# Patient Record
Sex: Male | Born: 1940
Health system: Southern US, Community
[De-identification: ages and names within clinical notes are randomized; demographics above are authoritative.]

## PROBLEM LIST (undated history)

## (undated) DIAGNOSIS — G4733 Obstructive sleep apnea (adult) (pediatric): Secondary | ICD-10-CM

## (undated) DIAGNOSIS — E0590 Thyrotoxicosis, unspecified without thyrotoxic crisis or storm: Secondary | ICD-10-CM

## (undated) DIAGNOSIS — I4891 Unspecified atrial fibrillation: Secondary | ICD-10-CM

## (undated) DIAGNOSIS — IMO0002 Reserved for concepts with insufficient information to code with codable children: Secondary | ICD-10-CM

## (undated) DIAGNOSIS — K219 Gastro-esophageal reflux disease without esophagitis: Secondary | ICD-10-CM

## (undated) DIAGNOSIS — Z87442 Personal history of urinary calculi: Secondary | ICD-10-CM

## (undated) DIAGNOSIS — Z9989 Dependence on other enabling machines and devices: Secondary | ICD-10-CM

## (undated) DIAGNOSIS — I4892 Unspecified atrial flutter: Secondary | ICD-10-CM

## (undated) HISTORY — DX: Unspecified atrial flutter: I48.92

## (undated) HISTORY — PX: ROTATOR CUFF REPAIR: SHX139

## (undated) HISTORY — DX: Obstructive sleep apnea (adult) (pediatric): G47.33

## (undated) HISTORY — DX: Unspecified atrial fibrillation: I48.91

## (undated) HISTORY — DX: Obstructive sleep apnea (adult) (pediatric): Z99.89

## (undated) HISTORY — DX: Thyrotoxicosis, unspecified without thyrotoxic crisis or storm: E05.90

## (undated) HISTORY — PX: OTHER SURGICAL HISTORY: SHX169

## (undated) HISTORY — DX: Reserved for concepts with insufficient information to code with codable children: IMO0002

---

## 1998-03-18 ENCOUNTER — Emergency Department (HOSPITAL_COMMUNITY): Admission: EM | Admit: 1998-03-18 | Discharge: 1998-03-18 | Payer: Self-pay | Admitting: Emergency Medicine

## 2007-08-19 ENCOUNTER — Ambulatory Visit (HOSPITAL_BASED_OUTPATIENT_CLINIC_OR_DEPARTMENT_OTHER): Admission: RE | Admit: 2007-08-19 | Discharge: 2007-08-20 | Payer: Self-pay | Admitting: Orthopedic Surgery

## 2007-08-23 ENCOUNTER — Emergency Department (HOSPITAL_COMMUNITY): Admission: EM | Admit: 2007-08-23 | Discharge: 2007-08-23 | Payer: Self-pay | Admitting: Emergency Medicine

## 2008-06-23 ENCOUNTER — Ambulatory Visit: Payer: Self-pay | Admitting: Internal Medicine

## 2008-06-23 DIAGNOSIS — E0590 Thyrotoxicosis, unspecified without thyrotoxic crisis or storm: Secondary | ICD-10-CM | POA: Insufficient documentation

## 2008-06-23 DIAGNOSIS — E210 Primary hyperparathyroidism: Secondary | ICD-10-CM | POA: Diagnosis present

## 2008-06-23 DIAGNOSIS — K4090 Unilateral inguinal hernia, without obstruction or gangrene, not specified as recurrent: Secondary | ICD-10-CM | POA: Insufficient documentation

## 2008-06-26 ENCOUNTER — Encounter (INDEPENDENT_AMBULATORY_CARE_PROVIDER_SITE_OTHER): Payer: Self-pay | Admitting: *Deleted

## 2008-07-05 ENCOUNTER — Telehealth (INDEPENDENT_AMBULATORY_CARE_PROVIDER_SITE_OTHER): Payer: Self-pay | Admitting: *Deleted

## 2008-07-06 ENCOUNTER — Telehealth (INDEPENDENT_AMBULATORY_CARE_PROVIDER_SITE_OTHER): Payer: Self-pay | Admitting: *Deleted

## 2008-07-06 ENCOUNTER — Ambulatory Visit: Payer: Self-pay | Admitting: Internal Medicine

## 2008-07-06 LAB — CONVERTED CEMR LAB: Hgb A1c MFr Bld: 6.5 % — ABNORMAL HIGH (ref 4.6–6.0)

## 2008-07-11 ENCOUNTER — Ambulatory Visit: Payer: Self-pay | Admitting: Gastroenterology

## 2008-08-01 ENCOUNTER — Encounter: Payer: Self-pay | Admitting: Internal Medicine

## 2008-08-08 ENCOUNTER — Ambulatory Visit: Payer: Self-pay | Admitting: Gastroenterology

## 2008-08-08 LAB — HM COLONOSCOPY

## 2008-08-29 ENCOUNTER — Ambulatory Visit: Payer: Self-pay | Admitting: Internal Medicine

## 2008-08-29 ENCOUNTER — Emergency Department (HOSPITAL_COMMUNITY): Admission: EM | Admit: 2008-08-29 | Discharge: 2008-08-29 | Payer: Self-pay | Admitting: Emergency Medicine

## 2008-09-01 ENCOUNTER — Ambulatory Visit: Payer: Self-pay | Admitting: Internal Medicine

## 2008-09-04 ENCOUNTER — Inpatient Hospital Stay (HOSPITAL_COMMUNITY): Admission: EM | Admit: 2008-09-04 | Discharge: 2008-09-06 | Payer: Self-pay | Admitting: Emergency Medicine

## 2008-09-04 ENCOUNTER — Ambulatory Visit: Payer: Self-pay | Admitting: Cardiovascular Disease

## 2008-09-05 ENCOUNTER — Encounter (INDEPENDENT_AMBULATORY_CARE_PROVIDER_SITE_OTHER): Payer: Self-pay | Admitting: Internal Medicine

## 2008-09-13 ENCOUNTER — Telehealth: Payer: Self-pay | Admitting: Internal Medicine

## 2008-09-15 ENCOUNTER — Encounter: Payer: Self-pay | Admitting: Internal Medicine

## 2008-09-21 ENCOUNTER — Inpatient Hospital Stay (HOSPITAL_COMMUNITY): Admission: RE | Admit: 2008-09-21 | Discharge: 2008-09-22 | Payer: Self-pay | Admitting: Neurological Surgery

## 2008-09-28 ENCOUNTER — Ambulatory Visit: Payer: Self-pay | Admitting: Internal Medicine

## 2008-09-28 DIAGNOSIS — I480 Paroxysmal atrial fibrillation: Secondary | ICD-10-CM | POA: Insufficient documentation

## 2008-09-28 DIAGNOSIS — I4891 Unspecified atrial fibrillation: Secondary | ICD-10-CM | POA: Insufficient documentation

## 2008-09-29 ENCOUNTER — Encounter: Payer: Self-pay | Admitting: Internal Medicine

## 2008-10-04 ENCOUNTER — Ambulatory Visit (HOSPITAL_COMMUNITY): Admission: RE | Admit: 2008-10-04 | Discharge: 2008-10-04 | Payer: Self-pay | Admitting: Neurological Surgery

## 2008-10-06 ENCOUNTER — Encounter: Payer: Self-pay | Admitting: Internal Medicine

## 2008-10-13 ENCOUNTER — Encounter: Payer: Self-pay | Admitting: Internal Medicine

## 2008-10-18 ENCOUNTER — Telehealth (INDEPENDENT_AMBULATORY_CARE_PROVIDER_SITE_OTHER): Payer: Self-pay | Admitting: *Deleted

## 2008-11-08 ENCOUNTER — Ambulatory Visit: Payer: Self-pay | Admitting: Internal Medicine

## 2008-12-01 HISTORY — PX: HERNIA REPAIR: SHX51

## 2008-12-12 ENCOUNTER — Telehealth: Payer: Self-pay | Admitting: Internal Medicine

## 2009-01-11 ENCOUNTER — Encounter: Payer: Self-pay | Admitting: Internal Medicine

## 2009-01-17 ENCOUNTER — Encounter: Admission: RE | Admit: 2009-01-17 | Discharge: 2009-01-17 | Payer: Self-pay | Admitting: Neurological Surgery

## 2009-02-14 ENCOUNTER — Encounter: Admission: RE | Admit: 2009-02-14 | Discharge: 2009-02-14 | Payer: Self-pay | Admitting: Neurological Surgery

## 2009-02-27 ENCOUNTER — Encounter: Payer: Self-pay | Admitting: Internal Medicine

## 2009-03-12 ENCOUNTER — Telehealth (INDEPENDENT_AMBULATORY_CARE_PROVIDER_SITE_OTHER): Payer: Self-pay | Admitting: *Deleted

## 2009-05-11 ENCOUNTER — Encounter: Payer: Self-pay | Admitting: Internal Medicine

## 2009-07-22 ENCOUNTER — Ambulatory Visit: Payer: Self-pay | Admitting: Internal Medicine

## 2009-07-22 ENCOUNTER — Emergency Department (HOSPITAL_COMMUNITY): Admission: EM | Admit: 2009-07-22 | Discharge: 2009-07-22 | Payer: Self-pay | Admitting: Emergency Medicine

## 2009-07-23 ENCOUNTER — Encounter: Payer: Self-pay | Admitting: Cardiology

## 2009-07-26 ENCOUNTER — Telehealth (INDEPENDENT_AMBULATORY_CARE_PROVIDER_SITE_OTHER): Payer: Self-pay

## 2009-07-30 ENCOUNTER — Ambulatory Visit: Payer: Self-pay

## 2009-12-31 ENCOUNTER — Ambulatory Visit: Payer: Self-pay | Admitting: Internal Medicine

## 2009-12-31 LAB — CONVERTED CEMR LAB
Bilirubin Urine: NEGATIVE
Glucose, Urine, Semiquant: NEGATIVE
Microalb Creat Ratio: 5.1 mg/g (ref 0.0–30.0)

## 2010-01-03 LAB — CONVERTED CEMR LAB
BUN: 11 mg/dL (ref 6–23)
Calcium: 9.9 mg/dL (ref 8.4–10.5)
Cholesterol: 152 mg/dL (ref 0–200)
Glucose, Bld: 106 mg/dL — ABNORMAL HIGH (ref 70–99)
HCT: 45.3 % (ref 39.0–52.0)
HDL: 41.1 mg/dL (ref >39.00)
Hemoglobin: 14.7 g/dL (ref 13.0–17.0)
Lymphocytes Relative: 35 % (ref 12.0–46.0)
MCV: 93.1 fL (ref 78.0–100.0)
Monocytes Absolute: 0.6 10*3/uL (ref 0.1–1.0)
Neutro Abs: 3.7 10*3/uL (ref 1.4–7.7)
Neutrophils Relative %: 53.6 % (ref 43.0–77.0)
Platelets: 179 10*3/uL (ref 150.0–400.0)
Potassium: 3.7 meq/L (ref 3.5–5.1)
RDW: 12.3 % (ref 11.5–14.6)
Sodium: 139 meq/L (ref 135–145)
Total CHOL/HDL Ratio: 4
Triglycerides: 119 mg/dL (ref 0.0–149.0)
VLDL: 23.8 mg/dL (ref 0.0–40.0)
WBC: 6.8 10*3/uL (ref 4.5–10.5)

## 2010-04-16 ENCOUNTER — Telehealth (INDEPENDENT_AMBULATORY_CARE_PROVIDER_SITE_OTHER): Payer: Self-pay | Admitting: *Deleted

## 2010-04-24 ENCOUNTER — Ambulatory Visit: Payer: Self-pay | Admitting: Internal Medicine

## 2010-04-25 ENCOUNTER — Encounter (INDEPENDENT_AMBULATORY_CARE_PROVIDER_SITE_OTHER): Payer: Self-pay | Admitting: *Deleted

## 2010-05-13 ENCOUNTER — Ambulatory Visit: Payer: Self-pay | Admitting: Pulmonary Disease

## 2010-05-13 DIAGNOSIS — G4733 Obstructive sleep apnea (adult) (pediatric): Secondary | ICD-10-CM

## 2010-05-13 DIAGNOSIS — Z9989 Dependence on other enabling machines and devices: Secondary | ICD-10-CM | POA: Insufficient documentation

## 2010-06-01 ENCOUNTER — Encounter: Payer: Self-pay | Admitting: Pulmonary Disease

## 2010-06-01 ENCOUNTER — Ambulatory Visit (HOSPITAL_BASED_OUTPATIENT_CLINIC_OR_DEPARTMENT_OTHER): Admission: RE | Admit: 2010-06-01 | Discharge: 2010-06-01 | Payer: Self-pay | Admitting: Pulmonary Disease

## 2010-06-08 ENCOUNTER — Ambulatory Visit: Payer: Self-pay | Admitting: Pulmonary Disease

## 2010-06-11 ENCOUNTER — Telehealth (INDEPENDENT_AMBULATORY_CARE_PROVIDER_SITE_OTHER): Payer: Self-pay | Admitting: *Deleted

## 2010-06-21 ENCOUNTER — Ambulatory Visit: Payer: Self-pay | Admitting: Pulmonary Disease

## 2010-09-10 ENCOUNTER — Ambulatory Visit: Payer: Self-pay | Admitting: Pulmonary Disease

## 2010-11-29 ENCOUNTER — Ambulatory Visit
Admission: RE | Admit: 2010-11-29 | Discharge: 2010-11-29 | Payer: Self-pay | Source: Home / Self Care | Attending: Internal Medicine | Admitting: Internal Medicine

## 2010-12-05 ENCOUNTER — Telehealth: Payer: Self-pay | Admitting: Internal Medicine

## 2010-12-22 ENCOUNTER — Encounter: Payer: Self-pay | Admitting: Neurological Surgery

## 2010-12-29 LAB — CONVERTED CEMR LAB
Basophils Relative: 0.2 % (ref 0.0–3.0)
Chloride: 99 meq/L (ref 96–112)
Eosinophils Absolute: 0.1 10*3/uL (ref 0.0–0.7)
LDL Cholesterol: 111 mg/dL — ABNORMAL HIGH (ref 0–99)
Lymphocytes Relative: 31.3 % (ref 12.0–46.0)
MCV: 91.6 fL (ref 78.0–100.0)
Neutro Abs: 4.4 10*3/uL (ref 1.4–7.7)
Neutrophils Relative %: 57.5 % (ref 43.0–77.0)
Platelets: 195 10*3/uL (ref 150–400)
Potassium: 4.1 meq/L (ref 3.5–5.1)
RDW: 12 % (ref 11.5–14.6)
Total CHOL/HDL Ratio: 4.9
VLDL: 20 mg/dL (ref 0–40)

## 2010-12-31 NOTE — Progress Notes (Signed)
Summary: rf neurontin  Phone Note Refill Request   Refills Requested: Medication #1:  NEURONTIN 300 MG CAPS 1 by mouth two times a day. [facility] [place]   Method Requested: Electronic Initial call taken by: Shary Decamp,  December 12, 2008 11:46 AM  Follow-up for Phone Call        ok Rf 1 month and 6 RF , if patient feels need a stronger dose , we can go for 600mg  two times a day Madonna Flegal E. Caylah Plouff MD  December 12, 2008 12:56 PM       Prescriptions: NEURONTIN 300 MG CAPS (GABAPENTIN) 1 by mouth two times a day  #60 x 6   Entered by:   Shary Decamp   Authorized by:   Nolon Rod. Kala Ambriz MD   Signed by:   Shary Decamp on 12/12/2008   Method used:   Electronically to        [facility] (retail)       [address]       [place], [place]  [address]       Ph: [phone]       Fax: [phone]   RxID:   [id number]

## 2010-12-31 NOTE — Assessment & Plan Note (Signed)
Summary: rov for osa   Visit Type:  Follow-up Copy to:  [facility] Primary Provider/Referring Provider:  Nolon Rod. Paz MD  CC:  follow up. pt states he uses cpap 7/7 nights x 4-6 hrs a night. pt states when he has a cold trhe mask makes him feel like he is sufficating. Pt states when he wakes up in the mornings and takes his mask off he has marks around his mouth. .  History of Present Illness: the pt comes in today for f/u of his known severe osa.  He was started on cpap at the last visit, and has been wearing compliantly.  He feels he is sleeping better, and has noted some improvement in his EDS, but still has some sleepiness with inactivity.  I have reminded him that we have yet to optimize his pressure.  He has had some mask issues, but much better after loosening the straps.  He feels the fit is adequate currently.  He has no issues with the pressure.  He does have some nasal congestion issues at times, but did not realize he could turn up the heat on his humidifier.  Current Medications (verified): 1)  None  Allergies (verified): 1)  ! Percocet (Oxycodone-Acetaminophen) 2)  ! Vicodin (Hydrocodone-Acetaminophen) 3)  ! * Oxycodone  Past History:  Past medical, surgical, family and social histories (including risk factors) reviewed, and no changes noted (except as noted below).  Past Medical History: Reviewed history from 04/24/2010 and no changes required. Paroxysmal atrial fibrillation  h/o acute HNP, s/p surgery 09-21-08 occasional paresthesia of the left foot Degenerative disk disease.  Left inguinal hernia.  Remote history of hyperthyroidism.   no h/o ablation AODM A1C 6.5 07-2008  Past Surgical History: Reviewed history from 05/13/2010 and no changes required. Rotator cuff repair (2009) - rt 10-09 L5 acute HNP, s/p surgery 09-21-08-- still has occasional paresthesia of the left foot hernia repair 2010  Family History: Reviewed history from 05/13/2010 and no changes  required. CAD - no HTN - M DM - brother stroke - no colon Ca - no prostate Ca - no father-alzheimer  Social History: Reviewed history from 05/13/2010 and no changes required. 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  The patient denies shortness of breath with activity, shortness of breath at rest, productive cough, non-productive cough, coughing up blood, chest pain, irregular heartbeats, indigestion, loss of appetite, weight change, abdominal pain, difficulty swallowing, sore throat, tooth/dental problems, headaches, sneezing, itching, ear ache, anxiety, depression, hand/feet swelling, change in color of mucus, and fever.    Vital Signs:  Patient profile:   70 year old male Height:      67 inches Weight:      169.13 pounds O2 Sat:      96 % on Room air Temp:     97.8 degrees F oral Pulse rate:   64 / minute BP sitting:   122 / 70  (right arm) Cuff size:   regular  Vitals Entered By: Carver Fila (September 10, 2010 9:19 AM)  O2 Flow:  Room air CC: follow up. pt states he uses cpap 7/7 nights x 4-6 hrs a night. pt states when he has a cold trhe mask makes him feel like he is sufficating. Pt states when he  wakes up in the mornings and takes his mask off he has marks around his mouth.  Comments meds and allergies updated Phone number updated Carver Fila  September 10, 2010 9:20 AM    Physical Exam  General:  wd male in nad Nose:  no skin breakdown or pressure necrosis from cpap mask no obvious purulence or drainage. Mouth:  clear Extremities:  no edema or cyanosis noted. Neurologic:  alert, does not appear sleepy, moves all 4 without obvious motor deficit.   Impression & Recommendations:  Problem # 1:  OBSTRUCTIVE SLEEP APNEA  (ICD-327.23) the pt is slowly getting adapted to cpap.  He is wearing compliantly, and all of his issues today are easily correctable.  I have asked him to not pull his mask straps too tight, but if he continues to have fitting issues, would look at a different type of mask.  I have also asked him to increase the heat on his humidifier to help with moisture and nasal stuffiness.  Finally, I have reminded him that we have yet to optimize his pressure, and that he will need to have his machine turned to auto mode to accomplish this. Care Plan:  At this point, will arrange for the patient's machine to be changed over to auto mode for 2 weeks to optimize their pressure.  I will review the downloaded data once sent by dme, and also evaluate for compliance, leaks, and residual osa.  I will call the patient and dme to discuss the results, and have the patient's machine set appropriately.  This will serve as the pt's cpap pressure titration.  Other Orders: Est. Patient Level IV (40347) DME Referral (DME)  Patient Instructions: 1)  will optimize your pressure for you on automatic mode for the next 2 weeks.  I will let you know the results when data available. 2)  followup with me in 6mos.

## 2010-12-31 NOTE — Assessment & Plan Note (Signed)
Summary: f/u from ED - per laurie/swh   Vital Signs:  Patient Profile:   70 Years Old Male Weight:      170 pounds Pulse rate:   74 / minute BP sitting:   136 / 82  (left arm)  Vitals Entered By: Doristine Devoid (September 01, 2008 10:22 AM)                 Chief Complaint:  F/U FROM ER.  History of Present Illness: F/u FROM ER here w/ wife RECORDS REVIEWED   IMPRESSION/PLAN:  Intractable left buttock pain with radiculopathy down   left lower extremity.  It appears the patient is suffering from sciatica   as the patient has experienced this in the past approximately 10 years   ago.  Unsure of onset of symptoms, likely due to yard work and possibly   exacerbated at chiropractic visit last week.  Again, patient with no   bowel or bladder incontinence and physical exam not consistent with any   spinal cord injury.  T11 fracture noted on plain film, likely a   coincidental finding as patient with no tenderness along palpation of   spine.  The patient able to ambulate in emergency room without any   difficulty, therefore, I feel comfortable discharging the patient home   from the emergency room on antiinflammatory medication, to include   Celebrex 200 mg p.o. b.i.d. x10 days.  The patient instructed to   continue taking Norco and Flexeril as needed for muscle spasm and pain.   The   patient and wife have been instructed to return to the emergency room   for any worsening pain, lower extremity weakness or bowel or bladder   incontinence.  Will call the patient's primary care physician's office   in a.m. in order to schedule a follow up appointment for this week to   determine effectiveness of treatment plan.        Current Allergies: No known allergies   Past Medical History:    Reviewed history from 06/23/2008 and no changes required:       h/o  Hyperthyroidism years ago,  no h/o ablation       h/o tachycardia after rotator cuff surgery  Past Surgical History:  Reviewed history from 06/23/2008 and no changes required:       Rotator cuff repair (2009) - rt   Social History:    Reviewed history from 06/23/2008 and no changes required:       Married       3 kids       Scientist, research (medical), semi-retired    Review of Systems       over all slightly  better, pain worse w/stepping w/ L foot  pain still radiates from L buttock  down to L leg to the toes, + tingling at toes sometimes on Celebrex w/o GI s/e, flexeril and if pain severe has dilaudid and oxycodone   General      Denies fever.  GI      no b/b incontinence  Derm      no buttock rash   Physical Exam  General:     alert and well-developed.   Extremities:     no pretibial edema bilaterally  Neurologic:     alert & oriented X3, strength normal in all extremities, sensation intact to pinprick, and DTRs symmetrical and normal.   ++ L streight leg test    Impression & Recommendations:  Problem # 1:  BACK PAIN (ICD-724.5) symptoms likely  due to a radiculopathy rec:  neuro surgical referal (patient preference instead of Ortho) MRI take celebrex 1 a day call if worse pain mamangment : see below  His updated medication list for this problem includes:    Celebrex 200 Mg Caps (Celecoxib) .Marland Kitchen..Marland Kitchen Two times a day    Flexeril 10 Mg Tabs (Cyclobenzaprine hcl) .Marland Kitchen... As needed    Vicodin 5-500 Mg Tabs (Hydrocodone-acetaminophen) .Marland Kitchen... 1 or 2 by mouth qid as needed  Orders: Radiology Referral (Radiology) Neurosurgeon Referral (Neurosurgeon)   Complete Medication List: 1)  Celebrex 200 Mg Caps (Celecoxib) .... Two times a day 2)  Flexeril 10 Mg Tabs (Cyclobenzaprine hcl) .... As needed 3)  Vicodin 5-500 Mg Tabs (Hydrocodone-acetaminophen) .Marland Kitchen.. 1 or 2 by mouth qid as needed    Prescriptions: VICODIN 5-500 MG TABS (HYDROCODONE-ACETAMINOPHEN) 1 or 2 by mouth qid as needed  #40 x 0   Entered and Authorized by:   Nolon Rod. Kayra Crowell MD   Signed by:   Nolon Rod. Randel Hargens MD on  09/01/2008   Method used:   Print then Give to Patient   RxID:   [id number]  ]

## 2010-12-31 NOTE — Progress Notes (Signed)
Summary: need to sched ov with kc    [id number]  Phone Note Outgoing Call   Call placed by: Arman Filter LPN,  June 11, 2010 4:13 PM Call placed to: Patient Summary of Call: pt needs ov with kc to discuss sleep study results.   [id number].  Arman Filter LPN  June 11, 2010 4:14 PM  Initial call taken by: Arman Filter LPN,  June 11, 2010 4:14 PM  Follow-up for Phone Call        [facility]  Aundra Millet Kimber Fritts LPN  June 12, 2010 2:14 PM   pt scheduled for Friday 06-21-2010 at 4pm.  Arman Filter LPN  June 14, 2010 9:15 AM

## 2010-12-31 NOTE — Assessment & Plan Note (Signed)
Summary: snores/wakes up tired/cbs   Vital Signs:  Patient profile:   70 year old male Height:      67 inches Weight:      173 pounds BMI:     27.19 Pulse rate:   64 / minute BP sitting:   110 / 60  Vitals Entered By: Shary Decamp (Apr 24, 2010 9:40 AM) CC: snoring, wakes up tired, wife feels pt needs sleep study   History of Present Illness: ROV -snoring x long time, sleeps on his back, wakes up tired, wife feels pt needs sleep study wife reports he falls sleep easy, gets drowsy when driving  -AODM , "do I have DM?"  Current Medications (verified): 1)  Aspirin 81 Mg Tbec (Aspirin) .... One A Day  Allergies (verified): 1)  ! Percocet (Oxycodone-Acetaminophen) 2)  ! Vicodin (Hydrocodone-Acetaminophen)  Past History:  Past Medical History: Paroxysmal atrial fibrillation  h/o acute HNP, s/p surgery 09-21-08 occasional paresthesia of the left foot Degenerative disk disease.  Left inguinal hernia.  Remote history of hyperthyroidism.   no h/o ablation AODM A1C 6.5 07-2008  Past Surgical History: Reviewed history from 12/31/2009 and no changes required. Rotator cuff repair (2009) - rt 10-09 L5 acute HNP, s/p surgery 09-21-08-- still has occasional paresthesia of the left foot  Social History: Married 3 Fish farm manager, semi-retired Denies tobacco, alcohol, or drugs thinking about improving his diet   Review of Systems       feels well  CV:  Denies chest pain or discomfort, palpitations, and swelling of feet.  Physical Exam  General:  alert and well-developed.   Lungs:  normal respiratory effort, no intercostal retractions, no accessory muscle use, and normal breath sounds.   Heart:  normal rate, regular rhythm, and no murmur.   Extremities:  no pretibial edema bilaterally    Impression & Recommendations:  Problem # 1:  DIABETES MELLITUS (ICD-250.00) the patient has DM, he is aware  abundant information provided regards DM, A1C goals, CBG  goals encouraged to do a healthier diet   His updated medication list for this problem includes:    Aspirin 81 Mg Tbec (Aspirin) ..... One a day  Orders: Venipuncture (09811) TLB-A1C / Hgb A1C (Glycohemoglobin) (83036-A1C)  Labs Reviewed: Creat: 0.9 (12/31/2009)    Reviewed HgBA1c results: 6.6 (12/31/2009)  6.5 (07/06/2008)  Problem # 2:  ? of SLEEP APNEA (ICD-780.57)   has snoring, some fatigue he is not obese, neck anatomy seems ok plan: refer to pulmonary to determine if  a sleep study is needed   Orders: Pulmonary Referral (Pulmonary)  Complete Medication List: 1)  Aspirin 81 Mg Tbec (Aspirin) .... One a day  Patient Instructions: 1)  Please schedule a follow-up appointment in 4 months .

## 2010-12-31 NOTE — Progress Notes (Signed)
Summary: QUESTION--HAVE COLONOSCPY OR HERNIA SURG 1ST??  Phone Note Call from Patient Call back at CELL = [phone]   Caller: Patient Summary of Call: PATIENT CAME IN THIS MORNING FOR LAB WORK AND LEFT TRIAGE MESSAGE FOR DR PAZ:  PATIENT SAYS THAT HE AND DR PAZ HAVE DISCUSSED PATIENT NEEDING COLONOSCOPY (WHICH IS NOT LISTED AS A REFERRAL YET) AND ALSO NEEDING HERNIA SURGERY  HIS QUESTION TO DR PAZ:  SHOULD HE HAVE COLONOSCOPY OR HERNIA SURGERY FIRST FOR BEST RESULTS AND LEAST PAIN (HE SAYS HE THINKS IT SHOULD BE THE COLONOSCOPY FIRST, BUT WANTS DR PAZ OPINION)  PLEASE CALL HIS CELL PHONE WITH ANSWER Initial call taken by: Jerolyn Shin,  July 06, 2008 11:47 AM  Follow-up for Phone Call        colonoscopy first sounds okay Jose E. Paz MD  July 06, 2008 3:27 PM   left message on machine ..........Marland KitchenDoristine Devoid  July 07, 2008 2:48 PM   spoke with patient advise colonoscopy first also declined nutritionist referral at this time sent patient diabetes information along with some exercise information aware of all labs..........Marland KitchenDoristine Devoid  July 07, 2008 2:57 PM

## 2010-12-31 NOTE — Assessment & Plan Note (Signed)
Summary: YEARLY/SWH   Vital Signs:  Patient Profile:   70 Years Old Male Weight:      170 pounds Pulse rate:   72 / minute BP sitting:   112 / 72  Vitals Entered By: Shary Decamp (June 23, 2008 8:41 AM)                 Chief Complaint:  NEW PT TO EST; NEEDS REFERRAL FOR SCREENING COLON; C/O OF LLQ PAIN....Ronald KitchenPT STATES THAT HE CAN FEEL SOMETHING "PROTRUDING".  History of Present Illness: NEW PT TO EST;   NEEDS REFERRAL FOR SCREENING COLON L groin mass, worse w/ bending , can push it in  Asthma History:  Immunizations:      Updated pneumovax: Pneumovax (Medicare)     Updated Prior Medication List: No Medications Current Allergies (reviewed today): No known allergies   Past Medical History:    h/o  Hyperthyroidism years ago,  no h/o ablation    h/o tachycardia after rotator cuff surgery  Past Surgical History:    Rotator cuff repair (2009) - rt   Family History:    CAD - no    HTN - M    DM - no    stroke - no    colon Ca - no    prostate Ca - no  Social History:    Married    3 Engineer, site, semi-retired   Risk Factors:  Tobacco use:  never Passive smoke exposure:  no Drug use:  no Alcohol use:  no   Review of Systems  CV      Denies chest pain or discomfort and swelling of feet.  Resp      Denies cough and shortness of breath.      snores a lot no AM tiredness  GI      Denies bloody stools and diarrhea.  GU      Denies hematuria and urinary hesitancy.      nocturia x 3  Psych      Denies anxiety and depression.   Physical Exam  General:     alert and well-developed.   Neck:     no thyromegaly.   Lungs:     normal respiratory effort, no intercostal retractions, no accessory muscle use, and normal breath sounds.   Heart:     normal rate, regular rhythm, and no murmur.   Abdomen:     soft, non-tender, no hepatomegaly, and no splenomegaly. L suprapubic area feels full, worse  w/  cough   Rectal:     No external abnormalities noted. Normal sphincter tone. No rectal masses or tenderness. no stools found Prostate:     Prostate gland firm and smooth, no enlargement, nodularity, tenderness, mass, asymmetry or induration. Extremities:     no pretibial edema bilaterally  Psych:     Cognition and judgment appear intact. Alert and cooperative with normal attention span and concentration.     Impression & Recommendations:  Problem # 1:  HYPERTHYROIDISM (ICD-242.90) labs Orders: TLB-TSH (Thyroid Stimulating Hormone) (84443-TSH)   Problem # 2:  Hx of UNSPECIFIED TACHYCARDIA (ICD-785.0) labs  EKG--sinus brady Orders: TLB-BMP (Basic Metabolic Panel-BMET) (80048-METABOL) TLB-CBC Platelet - w/Differential (85025-CBCD) Venipuncture (16109) EKG w/ Interpretation (93000)   Problem # 3:  HEALTH SCREENING (ICD-V70.0) refer to [facility] labs  TD and pneumonia shot today d/w pt shingles shot diet and exercise Orders: TLB-Lipid Panel (80061-LIPID) TLB-PSA (Prostate Specific Antigen) (84153-PSA) Gastroenterology Referral (GI)   Problem # 4:  INGUINAL HERNIA, LEFT (ICD-550.90) surgical referal Orders: Surgical Referral (Surgery)   Other Orders: Tetanus Toxoid w/Dx (16109) Pneumoccal Vaccine Adm- Medicare (G0009) Admin 1st Vaccine (60454) Admin of Any Addtl Vaccine (09811)   Patient Instructions: 1)  Please schedule a follow-up appointment in 1 year.   ]  Tetanus/Td Vaccine    Vaccine Type: Td    Site: left deltoid    Mfr: Sanofi Pasteur    Dose: 0.5 ml    Route: IM    Given by: Shary Decamp    Exp. Date: 01/02/2010    Lot #: [id number]  Pneumovax Vaccine    Vaccine Type: Pneumovax (Medicare)    Site: right deltoid    Mfr: Merck    Dose: 0.5 ml    Route: IM    Given by: Shary Decamp    Exp. Date: 04/30/2009    Lot #: [id number]

## 2010-12-31 NOTE — Procedures (Signed)
Summary: Coklonoscopy   Colonoscopy  Procedure date:  08/08/2008  Findings:      Location:  [facility].    Procedures Next Due Date:    Colonoscopy: 08/2018  Patient Name: Ronald Kelley, Ronald Kelley MRN:  Procedure Procedures: Colonoscopy CPT: 60454.  Personnel: Endoscopist: Rachael Fee, MD.  Referred By: Willow Ora, M.D.  Exam Location: Exam performed in Endoscopy Suite. Outpatient  Patient Consent: Procedure, Alternatives, Risks and Benefits discussed, consent obtained, from patient. Consent was obtained by the RN.  Indications  Average Risk Screening Routine.  History  Current Medications: Patient is not currently taking Coumadin.  Comments: Patient history reviewed and updated, pre-procedure physical performed prior to initiation of sedation? yes Pre-Exam Physical: Performed Aug 08, 2008. Cardio-pulmonary exam, Abdominal exam, Mental status exam WNL.  Exam Exam: Extent of exam reached: Cecum, extent intended: Cecum.  The cecum was identified by appendiceal orifice and IC valve. Patient position: on left side. Time to Cecum: 00:03:33. Time for Withdrawl: 00:08:28. Colon retroflexion performed. Images taken. ASA Classification: II. Tolerance: good.  Monitoring: Pulse and BP monitoring, Oximetry used. Supplemental O2 given.  Colon Prep Prep results: good.  Sedation Meds: Patient assessed and found to be appropriate for moderate (conscious) sedation. Fentanyl 50 mcg. given IV. Versed 7 mg. given IV.  Findings - NORMAL EXAM: Cecum to Rectum. Comments: OTHERWISE NORMAL EXAMINATION.  HEMORRHOIDS: External. Size: Small.   Assessment Abnormal examination, see findings above.  Comments: HEMORRHOIDS, OTHERWISE NORMAL EXAMINATION.  NO POLYPS OR CANCERS.  HE SHOULD CONTINUE TO FOLLOW CURRENT COLORECTAL CANCER SCREENING GUIDELINES WITH A REPEAT COLONOSCOPY IN 10 YEARS. Events  Unplanned Interventions: No intervention was required.  Unplanned  Events: There were no complications. Plans Comments: COLONOSCOPY IN 10 YEARS.   cc:  Willow Ora, MD  This report was created from the original endoscopy report, which was reviewed and signed by the above listed endoscopist.

## 2010-12-31 NOTE — Assessment & Plan Note (Signed)
Summary: consult for possible osa   Copy to:  [facility] Primary Provider/Referring Provider:  Nolon Rod. Paz MD  CC:  Sleep Consult.  History of Present Illness: The pt is a 70y/o male who I have been asked to see for possible osa.  He has been noted to have loud snoring as well as snoring arousals during sleep.  He goes to bed around 10-11pm, and arises at 6-7am to start his day.  He is a very restless sleeper, and is only rested 60% of the time at best upon awakening.  He has significant EDS, and his wife sees him dozing a lot when he gets quiet.  He will fall asleep watching tv or reading, and can also do this at prayer meetings.  He has some sleepiness with driving.  His epworth score today is 17.  He states that his weight is neutral over the last 2 years.  Current Medications (verified): 1)  None  Allergies (verified): 1)  ! Percocet (Oxycodone-Acetaminophen) 2)  ! Vicodin (Hydrocodone-Acetaminophen) 3)  ! * Oxycodone  Past History:  Past Medical History: Reviewed history from 04/24/2010 and no changes required. Paroxysmal atrial fibrillation  h/o acute HNP, s/p surgery 09-21-08 occasional paresthesia of the left foot Degenerative disk disease.  Left inguinal hernia.  Remote history of hyperthyroidism.   no h/o ablation AODM A1C 6.5 07-2008  Past Surgical History: Rotator cuff repair (2009) - rt 10-09 L5 acute HNP, s/p surgery 09-21-08-- still has occasional paresthesia of the left foot hernia repair 2010  Family History: Reviewed history from 06/23/2008 and no changes required. CAD - no HTN - M DM - brother stroke - no colon Ca - no prostate Ca - no father-alzheimer  Social History: Reviewed history from 04/24/2010 and no changes required. Married to [employer] 3 kids [employer], semi-retired Denies tobacco, alcohol, or drugs thinking about improving his diet   Review of Systems       The patient complains of indigestion.  The patient denies  shortness of breath with activity, shortness of breath at rest, productive cough, non-productive cough, coughing up blood, chest pain, irregular heartbeats, acid heartburn, loss of appetite, weight change, abdominal pain, difficulty swallowing, sore throat, tooth/dental problems, headaches, nasal congestion/difficulty breathing through nose, sneezing, itching, ear ache, anxiety, depression, hand/feet swelling, joint stiffness or pain, rash, change in color of mucus, and fever.    Vital Signs:  Patient profile:   70 year old male Height:  67 inches Weight:      173 pounds BMI:     27.19 O2 Sat:      93 % on Room air Temp:     98.4 degrees F oral Pulse rate:   79 / minute BP sitting:   110 / 66  (left arm) Cuff size:   regular  Vitals Entered By: Arman Filter LPN (May 13, 2010 10:43 AM) old male Height:      67 inches Weight:      173 pounds BMI:     27.19 O2 Sat:      93 % on Room air Temp:     98.4 degrees F oral Pulse rate:   79 / minute BP sitting:   110 / 66  (left arm) Cuff size:   regular  Vitals Entered By: Arman Filter LPN (May 13, 2010 10:43 AM)  O2 Flow:  Room air CC: Sleep Consult Comments Medications reviewed with patient Arman Filter LPN  May 13, 2010 10:43 AM    Physical Exam  General:  wd male in nad Eyes:  PERRLA and EOMI.   Nose:  patent without obvious obstruction or narrowing Mouth:  significant soft tissue redundancy with small posterior pharyngeal space. elongation of soft palate and uvula Neck:  no jvd, tmg, LN Lungs:  clear to auscultation Heart:  rrr, no mrg Abdomen:  soft and nontender, bs+ Extremities:  no edema or cyanosis, pulses intact distally Neurologic:  alert, mildly sleepy,  moves all 4.   Impression & Recommendations:  Problem # 1:  OBSTRUCTIVE SLEEP APNEA (ICD-327.23) the pt's history is very suspicious for osa.  He is a loud snorer, and definitely has significant daytime sleepiness.  I have had a long discussion with the pt about sleep apnea, including its impact on QOL and CV health.  I think he needs to have a sleep study, and the pt is agreeable.  Will arrange for f/u once the results are available.  Other Orders: Consultation Level IV (98119) Sleep Disorder Referral (Sleep Disorder)  Patient  Instructions: 1)  will set up for sleep study, and arrange followup once results are available.

## 2010-12-31 NOTE — Assessment & Plan Note (Signed)
Summary: CPX/KDC FASTING   Vital Signs:  Patient profile:   70 year old male Height:      67 inches Weight:      173 pounds BMI:     27.19 Pulse rate:   70 / minute BP sitting:   112 / 70  Vitals Entered By: Shary Decamp (December 31, 2009 11:19 AM) CC: yearly - fasting   History of Present Illness: Paroxysmal atrial fibrillation-- asx at present , recent stress test neg , patient thinks a-fib was  related to pain meds   h/o Sciatica , chronic numbness in the L foot       AODM -- has not check CBGs   Preventive Screening-Counseling & Management  Alcohol-Tobacco     Alcohol drinks/day: 0     Smoking Status: never  Current Medications (verified): 1)  None  Allergies (verified): 1)  ! Percocet (Oxycodone-Acetaminophen) 2)  ! Vicodin (Hydrocodone-Acetaminophen)  Past History:  Past Medical History: Paroxysmal atrial fibrillation  h/o acute HNP, s/p surgery 09-21-08 occasional paresthesia of the left foot Degenerative disk disease.  Left inguinal hernia.  Remote history of hyperthyroidism.   no h/o ablation AODM A1C 6.5 07-2008  Past Surgical History: Rotator cuff repair (2009) - rt 10-09 L5 acute HNP, s/p surgery 09-21-08-- still has occasional paresthesia of the left foot  Family History: Reviewed history from 06/23/2008 and no changes required. CAD - no HTN - M DM - no stroke - no colon Ca - no prostate Ca - no  Social History: Reviewed history from 12/25/2008 and no changes required. 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 CV:  Denies chest pain or discomfort and palpitations. Resp:  Denies cough and shortness of breath. GI:  Denies bloody stools, diarrhea, and nausea. GU:  Denies hematuria, urinary frequency, and urinary hesitancy.  Physical Exam  General:  alert, well-developed, and well-nourished.   Neck:  no thyromegaly and normal  carotid upstroke.   Lungs:  normal respiratory effort, no intercostal retractions, no accessory muscle use, and normal breath sounds.   Heart:  normal rate, regular rhythm, no murmur, and no gallop.   Abdomen:  soft, non-tender, no distention, and no masses.   Rectal:  few small external hemorrhoids noted. Normal sphincter tone. No rectal masses or tenderness. Prostate:  Prostate gland firm and smooth, no enlargement, nodularity, tenderness, mass, asymmetry or induration. Extremities:   no edema Psych:  Cognition and judgment appear intact. Alert and cooperative with normal attention span and concentration.    Impression & Recommendations:  Problem # 1:  DIABETES MELLITUS (ICD-250.00) information about diet exercise provided labs   His updated medication list for this problem includes:    Aspirin 81 Mg Tbec (Aspirin) ..... One a day  Orders: TLB-A1C / Hgb A1C (Glycohemoglobin) (83036-A1C) TLB-Microalbumin/Creat Ratio, Urine (82043-MALB)  Problem # 2:  HEALTH SCREENING (ICD-V70.0) Td 2009 flu shot "i don't get them", explained benefits  pneumonia shot 2009  Cscope 9-09, neg, next 10 years  Orders: Venipuncture (04540) TLB-Lipid Panel (80061-LIPID) TLB-BMP (Basic Metabolic Panel-BMET) (80048-METABOL) TLB-CBC Platelet - w/Differential (85025-CBCD) TLB-TSH (Thyroid Stimulating Hormone) (84443-TSH) TLB-ALT (SGPT) (84460-ALT) TLB-AST (SGOT) (84450-SGOT) TLB-PSA (Prostate Specific Antigen) (84153-PSA)  Problem # 3:  ATRIAL FIBRILLATION, PAROXYSMAL (ICD-427.31) asymptomatic, recent stress test negative patientbelieves atrial fibrillation was due to painkillers labs  His updated medication list for this problem includes:    Aspirin 81 Mg Tbec (Aspirin) ..... One a  day  Problem # 4:  HYPERTHYROIDISM (ICD-242.90) labs  Orders: TLB-TSH (Thyroid Stimulating Hormone) (84443-TSH)  Complete Medication List: 1)  Aspirin 81 Mg Tbec (Aspirin) .... One a day  Patient  Instructions: 1)  Please schedule a follow-up appointment in 4 months .      Preventive Care Screening  Prior Values:    PSA:  3.74 (06/23/2008)    Colonoscopy:  Location:  [facility].   (08/08/2008)    Last Tetanus Booster:  Td (06/23/2008)    Last Pneumovax:  Pneumovax (Medicare) (06/23/2008)   Laboratory Results   Urine Tests    Routine Urinalysis   Color: yellow Appearance: Clear Glucose: negative   (Normal Range: Negative) Bilirubin: negative   (Normal Range: Negative) Ketone: negative   (Normal Range: Negative) Spec. Gravity: <1.005   (Normal Range: 1.003-1.035) Blood: negative   (Normal Range: Negative) pH: 7.5   (Normal Range: 5.0-8.0) Protein: trace   (Normal Range: Negative) Urobilinogen: 0.2   (Normal Range: 0-1) Nitrite: negative   (Normal Range: Negative) Leukocyte Esterace: trace   (Normal Range: Negative)

## 2010-12-31 NOTE — Progress Notes (Signed)
----   Converted from flag ---- ---- 07/02/2008 12:45 PM, Jose E. Paz MD wrote: advised patient that he needs a hemoglobin A1c drawn, DX is hyperglycemia , he does not need to be fasting ------------------------------  called pt will come in tomorrow to have labs drawn for A1C

## 2010-12-31 NOTE — Letter (Signed)
Summary: [facility]  [facility]   Imported By: Lanelle Bal 10/04/2008 11:53:25  _____________________________________________________________________  External Attachment:    Type:   Image     Comment:   External Document

## 2010-12-31 NOTE — Progress Notes (Signed)
Summary: ?sleep apnea (lmom 5/18)  Phone Note Call from Patient Call back at [phone]   Caller: wife (pat) Summary of Call: Patient forgot to mention when he was here for physical that he would like to get tested for sleep apnea.  He snores & wakes up feeling tired.  ?? referral to pulm or ov here?? ........Marland KitchenShary Decamp  Apr 16, 2010 1:16 PM   Follow-up for Phone Call        due for office visit, will discuss on return to the office Jose E. Paz MD  Apr 16, 2010 3:40 PM   Additional Follow-up for Phone Call Additional follow up Details #1::        [email or web address] Barban  Apr 17, 2010 10:01 AM    Additional Follow-up for Phone Call Additional follow up Details #2::    patient returned calll - scheduled appt [id number] Follow-up by: Okey Regal Spring,  Apr 19, 2010 3:37 PM

## 2010-12-31 NOTE — Assessment & Plan Note (Signed)
Summary: rov to discuss sleep study results.   Copy to:  [facility] Primary Provider/Referring Provider:  Nolon Rod. Paz MD  CC:  Pt is here for a f/u appt to discuss sleep study results.  .  History of Present Illness: the pt comes in today for review of his recent sleep study.  He was found to have severe osa, with AHI of 40/hr and desat to 84%.  I have gone over the study with him and his wife in detail, and answered all of their questions.  Medications Prior to Update: 1)  None  Allergies (verified): 1)  ! Percocet (Oxycodone-Acetaminophen) 2)  ! Vicodin (Hydrocodone-Acetaminophen) 3)  ! * Oxycodone  Review of Systems  The patient denies shortness of breath with activity, shortness of breath at rest, productive cough, non-productive cough, coughing up blood, chest pain, irregular heartbeats, acid heartburn, indigestion, loss of appetite, weight change, abdominal pain, difficulty swallowing, sore throat, tooth/dental problems, headaches, nasal congestion/difficulty breathing through nose, sneezing, itching, ear ache, anxiety, depression, hand/feet swelling, joint stiffness or pain, rash, change in color of mucus, and fever.    Vital Signs:  Patient profile:   70 year old male Height:      67 inches Weight:      173.50 pounds BMI:     27.27 O2 Sat:      97 % on Room air Temp:     98.3 degrees F oral Pulse rate:   61 / minute BP sitting:   114 / 72  (left arm) Cuff size:   regular  Vitals Entered By: Arman Filter LPN (June 21, 2010 3:58 PM)  O2 Flow:  Room air CC: Pt is here for a f/u appt to discuss sleep study results.   Comments Medications reviewed with patient Arman Filter LPN  June 21, 2010 4:04 PM    Physical Exam  General:  wd male in nad Nose:  no obvious drainage noted. Extremities:  no edema or cyanosis Neurologic:  alert and oriented, moves all 4.   Impression & Recommendations:  Problem # 1:  OBSTRUCTIVE SLEEP APNEA (ICD-327.23) the pt has severe osa  by his sleep study, and is symptomatic.  I have discussed the various options for treatment of osa, but explained that cpap is his best option given the severity of his osa.  The pt is willing to try this.  I will set the patient up on cpap at a moderate pressure level to allow for desensitization, and will troubleshoot the device over the next 4-6weeks if needed.  The pt is to call me if having issues with tolerance.  Will then optimize the pressure once patient is able to wear cpap on a consistent basis.  Other Orders: Est. Patient Level III (30865) DME Referral (DME)  Patient Instructions: 1)  will start on cpap as a trial.  Please call if having issues with tolerance. 2)  followup with me in 5 weeks.

## 2010-12-31 NOTE — Letter (Signed)
Summary: Primary Care Consult Scheduled Letter  [facility] [place]  [address] [place], [place] [address]   Phone: [phone]  Fax: [phone]      04/25/2010 MRN: [id number]  [facility] [address] [place], [place]  [address]    Dear Mr. SLAYDEN,    We have scheduled an appointment for you.  At the recommendation of Dr. Willow Ora, we have scheduled you a consult with Dr. Marcelyn Bruins of [facility] on 05-13-2010 arrive by 10:30am.  Their address is [address], [address]nd floor, [place] [address]. The office phone number is [phone].  If this appointment day and time is not convenient for you, please feel free to call the office of the doctor you are being referred to at the number listed above and reschedule the appointment.    It is important for you to keep your scheduled appointments. We are here to make sure you are given good patient care.   Thank you,    Renee, Patient Care Coordinator [facility]

## 2011-01-02 NOTE — Assessment & Plan Note (Signed)
Summary: cold//cough//lch   Vital Signs:  Patient profile:   70 year old male Weight:      172.13 pounds Temp:     98.4 degrees F oral Pulse rate:   64 / minute Pulse rhythm:   regular BP sitting:   112 / 72  (left arm) Cuff size:   regular  Vitals Entered By: Army Fossa CMA (November 29, 2010 1:20 PM) CC: Pt here c/o chest congestion, drainage.  Comments x 2 1/2 weeks taking mucines [facility]    History of Present Illness: sinus and chest congestion for 2-1/2 weeks Has been using Mucinex like a medicine ----> no help  ++ post nasal dripping     Current Medications (verified): 1)  None  Allergies (verified): 1)  ! Percocet (Oxycodone-Acetaminophen) 2)  ! Vicodin (Hydrocodone-Acetaminophen) 3)  ! * Oxycodone  Past History:  Past Medical History: Reviewed history from 04/24/2010 and no changes required. Paroxysmal atrial fibrillation  h/o acute HNP, s/p surgery 09-21-08 occasional paresthesia of the left foot Degenerative disk disease.  Left inguinal hernia.  Remote history of hyperthyroidism.   no h/o ablation AODM A1C 6.5 07-2008  Past Surgical History: Reviewed history from 05/13/2010 and no changes required. Rotator cuff repair (2009) - rt 10-09 L5 acute HNP, s/p surgery 09-21-08-- still has occasional paresthesia of the left foot hernia repair 2010  Social History: Reviewed history from 05/13/2010 and no changes required. 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 ENT:  nasal d/c clear  . Resp:  some sputum, yellow-green . GI:  Denies nausea and vomiting. MS:  Denies muscle aches.  Physical Exam  General:  alert and well-developed.  NAD Head:  face symmetric, nontender to palpation Ears:  R ear normal and L ear normal.   Nose:  slightly congested Mouth:  no redness or discharge Lungs:   normal respiratory effort, no intercostal retractions, no accessory muscle use, and normal breath sounds.     Impression & Recommendations:  Problem # 1:  URI (ICD-465.9) see instructions   Complete Medication List: 1)  Amoxicillin 500 Mg Tabs (Amoxicillin) .... 2 by mouth two times a day 2)  Astepro 0.15 % Soln (Azelastine hcl) .... 2 puffs once daily  Patient Instructions: 1)  rest, fluids,tylenol 2)  mucinex DM twice a day (or similar med) 3)  saline nasal spray as needed 4)  astepro 2 sprays aon each side of the nose at night 5)  amoxicillin 6)  call if no better in few days 7)  call any time if symptoms severe 8)  you are due for a routine check up! Prescriptions: ASTEPRO 0.15 % SOLN (AZELASTINE HCL) 2 puffs once daily  #1 x 1   Entered and Authorized by:   Elita Quick E. Paz MD   Signed by:   Nolon Rod. Paz MD on 11/29/2010   Method used:   Print then Give to Patient   RxID:   [id number] AMOXICILLIN 500 MG TABS (AMOXICILLIN) 2 by mouth two times a day  #28 x 0   Entered and Authorized by:   Nolon Rod. Paz MD   Signed by:   Nolon Rod. Paz MD on 11/29/2010   Method used:   Print then Give to Patient   RxID:   [id number]    Orders Added: 1)  Est. Patient Level III [84696]

## 2011-01-02 NOTE — Progress Notes (Signed)
Summary: Alternate ABX  Phone Note Call from Patient   Details for Reason: [facility] Summary of Call: Patient called notifying that he is not better from his 12/30 visit. Mucinex along with ABX given have not helped, he continues to have cold, runny nose, cough and sore throat. Please send alternative ABX to the pharmacy above. Initial call taken by: Lucious Groves CMA,  December 05, 2010 4:08 PM  Follow-up for Phone Call         Also recommend Neti Rinse once daily as needed  if having  head congestion Follow-up by: Marga Melnick MD,  December 05, 2010 4:18 PM    New/Updated Medications: AZITHROMYCIN 250 MG TABS (AZITHROMYCIN) as per pack Prescriptions: AZITHROMYCIN 250 MG TABS (AZITHROMYCIN) as per pack  #1 x 0   Entered and Authorized by:   Marga Melnick MD   Signed by:   Marga Melnick MD on 12/05/2010   Method used:   Printed then faxed to ...       [facility]       [address]       [place], [place]  [address]       Ph: [phone]       Fax: [phone]   RxID:   [id number]

## 2011-02-03 ENCOUNTER — Other Ambulatory Visit: Payer: Self-pay | Admitting: Internal Medicine

## 2011-02-03 ENCOUNTER — Encounter: Payer: Self-pay | Admitting: Internal Medicine

## 2011-02-03 ENCOUNTER — Encounter (INDEPENDENT_AMBULATORY_CARE_PROVIDER_SITE_OTHER): Payer: Medicare Other | Admitting: Internal Medicine

## 2011-02-03 DIAGNOSIS — Z125 Encounter for screening for malignant neoplasm of prostate: Secondary | ICD-10-CM

## 2011-02-03 DIAGNOSIS — Z130 Encounter for screening for diseases of the blood and blood-forming organs and certain disorders involving the immune mechanism: Secondary | ICD-10-CM

## 2011-02-03 DIAGNOSIS — I4891 Unspecified atrial fibrillation: Secondary | ICD-10-CM

## 2011-02-03 DIAGNOSIS — E119 Type 2 diabetes mellitus without complications: Secondary | ICD-10-CM

## 2011-02-03 DIAGNOSIS — Z0000 Encounter for general adult medical examination without abnormal findings: Secondary | ICD-10-CM

## 2011-02-03 DIAGNOSIS — E0590 Thyrotoxicosis, unspecified without thyrotoxic crisis or storm: Secondary | ICD-10-CM

## 2011-02-03 DIAGNOSIS — Z136 Encounter for screening for cardiovascular disorders: Secondary | ICD-10-CM

## 2011-02-03 LAB — BASIC METABOLIC PANEL
Calcium: 9.8 mg/dL (ref 8.4–10.5)
Chloride: 101 mEq/L (ref 96–112)
Creatinine, Ser: 0.9 mg/dL (ref 0.4–1.5)
Glucose, Bld: 92 mg/dL (ref 70–99)
Potassium: 3.6 mEq/L (ref 3.5–5.1)
Sodium: 139 mEq/L (ref 135–145)

## 2011-02-03 LAB — CBC WITH DIFFERENTIAL/PLATELET
Eosinophils Absolute: 0.1 10*3/uL (ref 0.0–0.7)
Lymphocytes Relative: 35.7 % (ref 12.0–46.0)
Lymphs Abs: 2.6 10*3/uL (ref 0.7–4.0)
Monocytes Absolute: 0.5 10*3/uL (ref 0.1–1.0)
Monocytes Relative: 7.5 % (ref 3.0–12.0)

## 2011-02-03 LAB — TSH: TSH: 2.07 u[IU]/mL (ref 0.35–5.50)

## 2011-02-03 LAB — LIPID PANEL
HDL: 34.5 mg/dL — ABNORMAL LOW (ref >39.00)
Triglycerides: 77 mg/dL (ref 0.0–149.0)
VLDL: 15.4 mg/dL (ref 0.0–40.0)

## 2011-02-03 LAB — PSA: PSA: 5.21 ng/mL — ABNORMAL HIGH (ref 0.10–4.00)

## 2011-02-11 NOTE — Assessment & Plan Note (Signed)
Summary: physical/fasting/kn   Vital Signs:  Patient profile:   70 year old male Height:      67 inches Weight:      172 pounds Pulse rate:   72 / minute Pulse rhythm:   regular BP sitting:   116 / 72  (left arm) Cuff size:   regular  Vitals Entered By: Army Fossa CMA (February 03, 2011 9:52 AM) CC: CPX, fasting  Comments no complaints  [employer]    History of Present Illness: Here for Medicare AWV:  1.   Risk factors based on Past M, S, F history: reviewed  2.   Physical Activities: some yard work, occasionally golf  ~ 1 week, stetching daily  3.   Depression/mood: no problems noted or reported  4.   Hearing:  no problems noted or reported  5.   ADL's: independent  6.   Fall Risk: no recent falls, no increased risk  7.   Home Safety:  does feel safe at home 8.   Height, weight, &visual acuity: sees VS, vision corrected w/ glasses  9.   Counseling: yes  10.   Labs ordered based on risk factors: yes  11.           Referral Coordination, if needed  12.           Care Plan, see a/p 13.            Cognitive Assessment: motor wnl, cognition appropiate , he is doing very well    in addition, we discussed the following ED, this has been a problem on and off for a while, mild symptoms, wonders about medication   Remote history of hyperthyroidism.  -- due for TSH AODM -- dx discussed, has improved diet   Preventive Screening-Counseling & Management  Caffeine-Diet-Exercise     Does Patient Exercise: yes     Type of exercise: stretches   Current Medications (verified): 1)  None  Allergies (verified): 1)  ! Percocet (Oxycodone-Acetaminophen) 2)  ! Vicodin (Hydrocodone-Acetaminophen) 3)  ! * Oxycodone  Past History:  Past Medical History: Paroxysmal atrial fibrillation  h/o acute HNP, s/p surgery 09-21-08 occasional paresthesia of the left foot Degenerative disk disease.  Left inguinal hernia.  Remote history of hyperthyroidism.   no h/o ablation AODM A1C 6.5  07-2008 OSA-- on CPAP  Past Surgical History: Reviewed history from 05/13/2010 and no changes required. 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 CV:  Denies chest pain or discomfort and swelling of feet. Resp:  Denies cough and shortness of breath. GI:  Denies bloody stools, diarrhea, nausea, and vomiting. GU:  Denies dysuria, urinary frequency, and urinary hesitancy.  Physical Exam  General:  alert, well-developed, and well-nourished.   Neck:  no masses, no thyromegaly, no thyroid nodules or tenderness, and normal carotid upstroke.   Lungs:  normal respiratory effort, no intercostal retractions, no accessory muscle use, and normal breath sounds.   Heart:  normal rate, regular rhythm, and no murmur.   Abdomen:  soft, non-tender, no  distention, and no masses.   no bruits Rectal:  No external abnormalities noted. Normal sphincter tone. No rectal masses or tenderness. Prostate:  Prostate gland firm and smooth, no enlargement, nodularity, tenderness, mass, asymmetry or induration. Extremities:   no lower extremity edema Psych:  Cognition and judgment appear intact. Alert and cooperative with normal attention span and concentration.   Impression & Recommendations:  Problem # 1:  HEALTH SCREENING (ICD-V70.0)  Td 2009 pneumonia shot 2009  shingles shot Rx provided, benefits discussed   Cscope 9-09, neg, next 10 years  diet and exercise discussed Orders: Venipuncture (33295) TLB-Lipid Panel (80061-LIPID) TLB-BMP (Basic Metabolic  Panel-BMET) (80048-METABOL) TLB-CBC Platelet - w/Differential (85025-CBCD) TLB-TSH (Thyroid Stimulating Hormone) (84443-TSH) TLB-ALT (SGPT) (84460-ALT) TLB-AST (SGOT) (84450-SGOT) TLB-PSA (Prostate Specific Antigen) (84153-PSA)  Orders: Medicare -1st Annual Wellness Visit [phone])  Problem # 2:  DIABETES MELLITUS (ICD-250.00)  all of his previous A1 C's were discussed, for now continue with lifestyle modification   Orders: Venipuncture (66063) TLB-BMP (Basic Metabolic Panel-BMET) (80048-METABOL) TLB-A1C / Hgb A1C (Glycohemoglobin) (83036-A1C) TLB-Lipid Panel (80061-LIPID) Specimen Handling (01601)  Labs Reviewed: Creat: 0.9 (12/31/2009)    Reviewed HgBA1c results: 6.5 (04/24/2010)  6.6 (12/31/2009)  Problem # 3:  OBSTRUCTIVE SLEEP APNEA (ICD-327.23)  tolerating CPAP better Orders: EKG w/ Interpretation (93000)  Problem # 4:  HYPERTHYROIDISM (ICD-242.90)  history of remote ablation, labs Orders: TLB-TSH (Thyroid Stimulating Hormone) (84443-TSH)  erectile dysfunction, new problem, medications discussed. Will call if interested  Problem # 6:  ATRIAL FIBRILLATION, PAROXYSMAL (ICD-427.31) EKG sinus brady, on no meds, asx   Complete Medication List: 1)  Zostavax 09323 Unt/0.49ml Solr (Zoster vaccine live) .Marland Kitchen.. 1 injection subcutaneously.  Patient Instructions: 1)  Please schedule a follow-up appointment in 6 months .  Prescriptions: ZOSTAVAX 55732 UNT/0.65ML SOLR (ZOSTER VACCINE LIVE) 1 injection Subcutaneously.  #1 x 0   Entered by:   Army Fossa CMA   Authorized by:   Nolon Rod. Jamiyla Ishee MD   Signed by:   Army Fossa CMA on 02/03/2011   Method used:   Print then Give to Patient   RxID:   [id number]    Orders Added: 1)  Venipuncture [15176] 2)  TLB-BMP (Basic Metabolic Panel-BMET) [80048-METABOL] 3)  TLB-CBC Platelet - w/Differential [85025-CBCD] 4)  TLB-A1C / Hgb A1C (Glycohemoglobin) [83036-A1C] 5)  TLB-Lipid Panel [80061-LIPID] 6)  TLB-PSA (Prostate  Specific Antigen) [84153-PSA] 7)  TLB-TSH (Thyroid Stimulating Hormone) [84443-TSH] 8)  EKG w/ Interpretation [93000] 9)  Specimen Handling [99000] 10)  Medicare -1st Annual Wellness Visit [G0438] 11)  Est. Patient Level III [16073]     Risk Factors:  Exercise:  yes    Type:  stretches

## 2011-02-27 ENCOUNTER — Telehealth: Payer: Self-pay | Admitting: Internal Medicine

## 2011-02-27 DIAGNOSIS — R972 Elevated prostate specific antigen [PSA]: Secondary | ICD-10-CM

## 2011-02-27 NOTE — Telephone Encounter (Signed)
Patient says that he saw Dr Drue Novel 2-3 weeks ago and his labs showed that his PSA had gone from over 3 to over 5---he couldn't remember the numbers    Although Dr Drue Novel said to wait and repeat in 6 months, he would like a referral to Urologist  Dr Heloise Purpura (says he is in same facility as Dr Vonita Moss)

## 2011-02-28 NOTE — Telephone Encounter (Signed)
Please arrange the referal, send all available PSAs to urology

## 2011-02-28 NOTE — Telephone Encounter (Signed)
Ok to refer pt?

## 2011-03-01 ENCOUNTER — Telehealth: Payer: Self-pay | Admitting: Pulmonary Disease

## 2011-03-01 NOTE — Telephone Encounter (Signed)
Download on auto shows poor compliance with cpap, with only 19/26 days used, and only 17% greater or equal to 4 hrs. I do not think he had enough time to get accurate pressure.  Megan, please tell patient the above, and see if there is something we can do to improve use.  Would like to try to optimize his pressure again, or if still on auto, can get a more recent 2 week download if he has been wearing the device.  If he has issues with pressure or mask, will need to correct this before optimizing pressure again.

## 2011-03-05 NOTE — Telephone Encounter (Signed)
Called and spoke with pt. Pt states the reason his download shows poor compliance is because he recently went out of town to see family and got sick with a cold and didn't use machine.  Pt states he still is "sleepy" at times but states he does see an improvement overall.  Pt is unsure if his machine is set to a fixed pressure or is on auto.  Pt actually has a f/u appt with [facility] scheduled for next week 4/10 at 9:30.  Will forward message to [facility] as an FYI.

## 2011-03-06 NOTE — Telephone Encounter (Signed)
Please have the pt bring in his machine and we can get a download off of it.

## 2011-03-08 LAB — CBC
Hemoglobin: 15.8 g/dL (ref 13.0–17.0)
Platelets: 161 10*3/uL (ref 150–400)
WBC: 8.5 10*3/uL (ref 4.0–10.5)

## 2011-03-08 LAB — POCT CARDIAC MARKERS
Myoglobin, poc: 59.4 ng/mL (ref 12–200)
Troponin i, poc: <0.05 ng/mL (ref 0.00–0.09)
Troponin i, poc: <0.05 ng/mL (ref 0.00–0.09)

## 2011-03-08 LAB — URINALYSIS, ROUTINE W REFLEX MICROSCOPIC
Bilirubin Urine: NEGATIVE
Glucose, UA: NEGATIVE mg/dL
Hgb urine dipstick: NEGATIVE

## 2011-03-08 LAB — BASIC METABOLIC PANEL
CO2: 24 mEq/L (ref 19–32)
Calcium: 10.1 mg/dL (ref 8.4–10.5)
Chloride: 108 mEq/L (ref 96–112)
GFR calc Af Amer: >60 mL/min (ref >60)
GFR calc non Af Amer: >60 mL/min (ref >60)
Sodium: 141 mEq/L (ref 135–145)

## 2011-03-08 LAB — DIFFERENTIAL
Basophils Absolute: 0 10*3/uL (ref 0.0–0.1)
Lymphocytes Relative: 30 % (ref 12–46)
Lymphs Abs: 2.6 10*3/uL (ref 0.7–4.0)
Monocytes Absolute: 0.9 10*3/uL (ref 0.1–1.0)
Monocytes Relative: 10 % (ref 3–12)
Neutrophils Relative %: 56 % (ref 43–77)

## 2011-03-08 LAB — TROPONIN I: Troponin I: 0.03 ng/mL (ref 0.00–0.06)

## 2011-03-08 LAB — CK TOTAL AND CKMB (NOT AT ARMC): Relative Index: INVALID (ref 0.0–2.5)

## 2011-03-10 ENCOUNTER — Encounter: Payer: Self-pay | Admitting: Pulmonary Disease

## 2011-03-11 ENCOUNTER — Ambulatory Visit: Payer: Self-pay | Admitting: Pulmonary Disease

## 2011-03-11 ENCOUNTER — Encounter: Payer: Self-pay | Admitting: Pulmonary Disease

## 2011-03-11 ENCOUNTER — Ambulatory Visit: Payer: Self-pay | Admitting: Internal Medicine

## 2011-03-11 ENCOUNTER — Ambulatory Visit (INDEPENDENT_AMBULATORY_CARE_PROVIDER_SITE_OTHER): Payer: Medicare Other | Admitting: Pulmonary Disease

## 2011-03-11 VITALS — BP 120/60 | HR 57 | Temp 97.7°F | Ht 67.0 in | Wt 173.6 lb

## 2011-03-11 DIAGNOSIS — G4733 Obstructive sleep apnea (adult) (pediatric): Secondary | ICD-10-CM

## 2011-03-11 NOTE — Progress Notes (Signed)
  Subjective:    Patient ID: Ronald Kelley, male    DOB: 07/07/1941, 70 y.o.   MRN: [id number]  HPI The pt comes in today for f/u of his known severe osa.  He has been wearing cpap somewhat compliantly, with download today showing a 64% use greater than or equal to 4 hrs a night.  He did not use 30 nights out of 257.  He feels he sleeps ok, but is not completely rested in the am's upon arising.  He still feels a lack of energy during the day, as well as fatigue.  He was also noted to have large numbers of PLMS on his sleep study, but it was unclear if these were due to his osa vs a concomitant primary movement disorder of sleep.  He denies leg kicking during the night, and has no symptoms c/w RLS.   Review of Systems  Constitutional: Positive for unexpected weight change. Negative for fever.  HENT: Positive for congestion, rhinorrhea, sneezing, postnasal drip and sinus pressure. Negative for ear pain, nosebleeds, sore throat, trouble swallowing and dental problem.   Eyes: Positive for itching. Negative for redness.  Respiratory: Negative for cough, chest tightness, shortness of breath and wheezing.   Cardiovascular: Negative for palpitations and leg swelling.  Gastrointestinal: Negative for nausea and vomiting.  Genitourinary: Negative for dysuria.  Musculoskeletal: Negative for joint swelling.  Skin: Negative for rash.  Neurological: Negative for headaches.  Hematological: Does not bruise/bleed easily.  Psychiatric/Behavioral: Negative for dysphoric mood. The patient is not nervous/anxious.        Objective:   Physical Exam WD male in nad  No skin breakdown or pressure necrosis from cpap mask Chest clear to auscultation Cor with rrr No LE edema or cyanosis  Alert, does not appear sleepy, moves all 4        Assessment & Plan:

## 2011-03-11 NOTE — Telephone Encounter (Signed)
Pt was already seen today and brought in his machine to get the download.

## 2011-03-11 NOTE — Patient Instructions (Signed)
Will have cpap changed over to fixed pressure at 10cm Consider a trial of requip for your leg movements noted on your sleep study followup with me in one year if doing well.

## 2011-03-12 NOTE — Assessment & Plan Note (Addendum)
The pt is wearing cpap with moderate compliance, but is c/o daytime fatigue and lack of energy.  It is unclear if this could be due to his decreased compliance, related to a movement disorder of sleep such as PLMD, or may have nothing to do with a sleep disorder.  I have encouraged him to work on improved compliance, and he feels he has done much better since the beginning of the new year.  I have also suggested a 4 week trial of a dopamine agonist to see if his leg movements may be playing a role in this, but the pt would prefer to hold off at this time.  I will go ahead and switch his machine over to a fixed pressure based on his recent download.

## 2011-04-15 NOTE — Consult Note (Signed)
NAME:  Ronald Kelley, Ronald Kelley                ACCOUNT NO.:  [id number]   MEDICAL RECORD NO.:  [id number]          PATIENT TYPE:  EMS   LOCATION:  [facility]                         FACILITY:  [facility]   PHYSICIAN:  Nicolasa Ducking, ANP DATE OF BIRTH:  [birth year]   DATE OF CONSULTATION:  07/22/2009  DATE OF DISCHARGE:                                 CONSULTATION   PRIMARY CARDIOLOGIST:  Hillis Range, MD   PRIMARY CARE Orvil Faraone:  Willow Ora, MD   PATIENT PROFILE:  A 70 year old Caucasian male with prior history  paroxysmal atrial fibrillation who presents with recurrent AFib.   PROBLEMS:  1. Paroxysmal atrial fibrillation.      a.     CHADS2 score of 0.      b.     September 05, 2008, 2-D echocardiogram:  Ejection fraction 65%,       trivial aortic insufficiency.  2. History of sciatica with chronic left leg and ankle pain.  3. Degenerative joint disease status post L4-L5 microdiskectomy,      September 21, 2008.  4. History of left inguinal hernia.  5. Remote history of hyperthyroidism, treated with radioactive iodine.  6. Status post right rotator cuff tear/repair.   HISTORY OF PRESENT ILLNESS:  A 70 year old Caucasian male with history  of paroxysmal atrial fibrillation.  He takes no meds at home.  He has  refused Coumadin in the past.  Over the past 6+ months, he has noticed  decreased exercise tolerance and fatigue, especially with walking up  inclines or mowing his lawn.  He has not had any chest pain.  Last  evening, he went to bed at about 9:30 p.m. and awoke at 11:30 p.m. with  weakness or feeling weak.  He recognized that this symptom has something  similar to what he had with AFib episodes in the past.  He checked his  pulse and noted a rapid irregular heartbeat.  He is not sure but thinks  he may have had some mild chest pressure.  He laid back down and waited  for 2 hours and then called his niece who was not at home and then he  subsequently called EMS at about 2:00 a.m.  When they came to  his house,  they found him in atrial fibrillation with rapid ventricular response.  He decided to drive himself to the emergency department at that point.  Once he was here, he broke spontaneously earlier this morning.  Currently, he is symptom free and wants to go home.   ALLERGIES:  PERCOCET and VICODIN.   HOME MEDICATIONS:  He takes IBUPROFEN 2 mg p.r.n. for back pain.   FAMILY HISTORY:  His mother died of pulmonary embolus at age 32.  Father  died of Alzheimer's at 30.  His brother with diabetes.  He has 4 sisters  who are alive and well.  One sister who was recently deceased.   SOCIAL HISTORY:  He lives in [place] with his wife.  He is an Geophysical data processor.  He denies tobacco, alcohol, or drug use.  He does not  routinely exercise.   REVIEW  OF SYSTEMS:  Positive for mild palpitations with weakness.  He  may have had some chest pressure with his symptoms tonight.  He does not  really commit to that statement.  He is a full code.  Otherwise, all  systems reviewed negative.   PHYSICAL EXAMINATION:  VITAL SIGNS:  Temperature 97.0, heart rate was  109 now 57, respirations 20, blood pressure 117/70, pulse ox 90% on 2 L.  GENERAL:  Pleasant white male in no acute distress, awake, alert, and  oriented x3.  HEENT:  Normal.  PSYCH:  He has a normal affect.  NEUROLOGIC:  Grossly intact, nonfocal.  SKIN:  Warm and dry without lesions or masses.  MUSCULOSKELETAL:  Grossly normal without deformity or effusion.  NECK:  Supple without bruits or JVD.  LUNGS:  Respirations are regular and unlabored.  Clear to auscultation.  CARDIAC:  Regular S1-S2.  No S3, S4, or murmurs.  ABDOMEN:  Round, soft, nontender, and nondistended.  Bowel sounds  present x4.  EXTREMITIES:  Warm, dry, and pink.  No clubbing, cyanosis, or edema.  Dorsalis pedis and posterior tibial pulses 2+ bilaterally.   IMAGING:  Chest x-ray shows no acute cardiopulmonary process.  EKG  showed atrial fibrillation, rate of 109,  normal axis, no acute ST-T  changes.   LABORATORY DATA:  Hemoglobin 15.8, hematocrit 45.4, WBC 8.5, and  platelets 161.  Sodium 141, potassium 3.8, chloride 108, CO2 24, BUN 18,  creatinine 0.82, and glucose 133.  CK-MB 1.2 and troponin I less than  0.05.  Urinalysis negative.   ASSESSMENT AND PLAN:  Paroxysmal atrial fibrillation.  The patient with  a history of decreased exercise tolerance x6 plus months.  He does not  necessarily feel palpitations when he has atrial fibrillation, just  weakness and it is not clear as to whether or not he is having more  episodes of atrial fibrillation.  There is also a question of some  deconditioning as he does not do very much at home.  He has never been  evaluated from an ischemic standpoint before either.  Finally, he has  had similar history of fatigue with hyperthyroidism in the past.  Last  evening, he was weak and he identified those symptoms of atrial  fibrillation and broke in spontaneously in the emergency department.  He  is now feeling much better.  He has been reluctant to take medications  in the past and his CHADS score is 0.  He had normal ejection fraction  in October 2009.  We will check cardiac markers as well as TSH here in  the emergency department.  If enzymes are negative, we will plan to  discharge home from the emergency department.  We will arrange a  followup with Dr. Johney Frame along with an adenosine Myoview in the office.  We advised that he take aspirin 325 mg daily.  He is not willing at this  point to take beta-blocker or calcium channel blocker.  If cardiac  markers return positive, we plan to admit and cath in tomorrow.      Nicolasa Ducking, ANP     CB/MEDQ  D:  07/22/2009  T:  07/23/2009  Job:  [id number]

## 2011-04-15 NOTE — Consult Note (Signed)
NAME:  Levert, Keaten                ACCOUNT NO.:  [id number]   MEDICAL RECORD NO.:  [id number]          PATIENT TYPE:  EMS   LOCATION:  [facility]                         FACILITY:  [facility]   PHYSICIAN:  Valerie A. Felicity Coyer, MDDATE OF BIRTH:  1941/05/30   DATE OF CONSULTATION:  DATE OF DISCHARGE:                                 CONSULTATION   REQUESTING PHYSICIAN:  Jennet Maduro, MD, emergency department.   PRIMARY CARE PHYSICIAN:  Willow Ora, MD   REASON FOR CONSULTATION:  Intractable left buttock pain with  radiculopathy down left lower extremity.   HISTORY OF PRESENT ILLNESS:  Mr. Callaway is a 70 year old white male  relatively healthy who reports chronic back pain times several years.  However, in the past 2 weeks, the patient has been experiencing  increased lower back pain, specifically in left buttock radiating down  left leg.  The patient reports he noticed onset of pain shortly after  doing yard work 2 weeks prior to this consultation at which time the  patient visited a chiropractor for some relief of pain.  The patient  states the pain has become increasingly worse to the point this a.m.,  where he was unable to ambulate.  Also of note, the patient has been  taking Norco and Flexeril at home; medications were prescribed for  previous surgery, with little to no relief.  The patient required  consultation from primary care physician prior to being discharged home  from emergency room.  The patient denies any recent trauma.  No recent  motor vehicle accident.  No recent fall or blunt trauma.  The patient  denies any bowel or bladder incontinence.  No numbness or tingling.   PAST MEDICAL HISTORY:  1. Hyperthyroidism.  2. Left inguinal hernia.  3. Right shoulder rotator cuff repair in 08/2007.   MEDICATIONS:  No regularly prescribed medications; however, the patient  has been taking Norco and Flexeril as prescribed for previous surgery  for current pain.   ALLERGIES:  PERCOCET.   FAMILY HISTORY:  Reviewed and noncontributory to this admit.   SOCIAL HISTORY:  The patient is married with three children.  He is semi-  retired from [employer].  No history of tobacco or ETOH use.   REVIEW OF SYSTEMS:  CONSTITUTIONAL:  The patient denies any weakness,  fever or chills.  EYES:  The patient denies any diplopia or blurred vision.  EARS/NOSE/THROAT:  No lesions.  Normal hearing.  NECK:  The patient denies any stiffness or adenopathy.  CARDIOVASCULAR:  The patient denies any chest pain, shortness of breath,  palpitations or lower extremity edema.  RESPIRATORY:  The patient denies any recent cough or shortness of  breath.  GI:  No recent nausea, vomiting, diarrhea.  No hematochezia or melena.  No bowel incontinence.  GU:  No dysuria or hematuria.  No bladder incontinence.  MUSCULOSKELETAL:  Negative for any joint pain or swelling.  NEUROLOGIC:  The patient denies any focal weakness.  The patient does  report radicular pain down left lower extremity originating in left  buttocks.  Pain relieved with positive flexion.  PSYCHOLOGICAL:  The patient denies any depression or anxiety.   PHYSICAL EXAMINATION:  VITAL SIGNS:  Blood pressure 120/68, heart rate  69, respirations 18, temperature 99.4.  O2 sat is 98% on room air.  GENERAL:  This is a well-nourished, well-developed white male, awake and  alert in no acute distress.  HEENT:  Head is normocephalic, atraumatic.  Eyes; pupils are equal,  round and reactive to light.  No scleral icterus or injection.  Ears,  nose and throat; mucous membranes are moist with no oropharyngeal  lesions.  Patient with normal hearing.  NECK:  Supple with no thyromegaly or lymphadenopathy.  CHEST:  With symmetrical movement.  Nontender to palpation.  CARDIOVASCULAR:  S1-S2.  Regular rate and rhythm.  No murmurs, rubs or  gallops.  No JVD, no carotid bruits.  No lower extremity edema.  RESPIRATORY:  Lung sounds are clear to auscultation  bilaterally.  No  wheezes, rales or crackles.  No increased work of breathing.  GI:  Abdomen is soft, nontender, nondistended with positive bowel  sounds.  No appreciated masses or hepatosplenomegaly.  MUSCULOSKELETAL:  Patient without any joint swelling or deformity.  Patient without tenderness along palpation of spine.  NEUROLOGIC:  The patient moves all extremities x4 with 4+ strength.  Patient with symmetric reflexes throughout.  SKIN:  Without any suspicious rashes or lesions.  PSYCHOLOGICAL:  The patient is alert and oriented x3 with normal affect.   PERTINENT LABORATORY AND X-RAY DATA:  Hemoglobin 16.0, hematocrit 47.0,  sodium 142, potassium 3.6, BUN 18, creatinine 1.0, glucose 132.  Lumbar  spine film with no acute findings; however, the film did show mild  compression deformity at T11 of indeterminate age.   IMPRESSION/PLAN:  Intractable left buttock pain with radiculopathy down  left lower extremity.  It appears the patient is suffering from sciatica  as the patient has experienced this in the past approximately 10 years  ago.  Unsure of onset of symptoms, likely due to yard work and possibly  exacerbated at chiropractic visit last week.  Again, patient with no  bowel or bladder incontinence and physical exam not consistent with any  spinal cord injury.  T11 fracture noted on plain film, likely a  coincidental finding as patient with no tenderness along palpation of  spine.  The patient able to ambulate in emergency room without any  difficulty, therefore, I feel comfortable discharging the patient home  from the emergency room on antiinflammatory medication, to include  Celebrex 200 mg p.o. b.i.d. x10 days.  The patient instructed to  continue taking Norco and Flexeril as needed for muscle spasm and pain.  The  patient and wife have been instructed to return to the emergency room  for any worsening pain, lower extremity weakness or bowel or bladder  incontinence.  Will  call the patient's primary care physician's office  in a.m. in order to schedule a follow up appointment for this week to  determine effectiveness of treatment plan.      Cordelia Pen, NP      Raenette Rover. Felicity Coyer, MD  Electronically Signed    LE/MEDQ  D:  08/29/2008  T:  08/29/2008  Job:  [id number]   cc:   Willow Ora, MD

## 2011-04-15 NOTE — Consult Note (Signed)
NAME:  Kwan, Alaric                ACCOUNT NO.:  [id number]   MEDICAL RECORD NO.:  [id number]          PATIENT TYPE:  INP   LOCATION:  2924                         FACILITY:  [facility]   PHYSICIAN:  Verne Carrow, MDDATE OF BIRTH:  [birth year]   DATE OF CONSULTATION:  09/04/2008  DATE OF DISCHARGE:                                 CONSULTATION   CONSULTING PHYSICIAN:  Valerie A. Felicity Coyer, MD   PRIMARY CARE PHYSICIAN:  Willow Ora, MD   REASON FOR CONSULTATION:  Atrial fibrillation with rapid ventricular  response.   HISTORY OF PRESENT ILLNESS:  Mr. Jorge is a pleasant 70 year old  Caucasian male with a remote history of hyperthyroidism that was  untreated as well as a remote episode of atrial fibrillation 1 year ago,  who presented to the emergency department today with continued left  buttock and thigh pain radiating from his back and was found to have  atrial fibrillation with rapid ventricular response.  The patient tells  me that he was seen in the emergency department here 1 week ago with  complaints of lower back pain with radiation into his left buttock and  thigh.  At that time, he was felt to have radiculopathy and was  discharged to home from the emergency department with a plan for  Celebrex for 10 days as well as continued pain medicines and muscle  relaxant.  The pain has continued over the course of the week.  Last  night, the patient began to become aware of a mild shortness of breath.  But his hand on his chest felt that his heart rhythm was irregular and  was very fast.  This is what prompted him to come into the emergency  department today.  Upon arrival in the emergency department, he was  found to be in atrial fibrillation with rapid ventricular response with  a ventricular rate of 150 beats per minute.  He was started on diltiazem  drip as well as a heparin drip in the emergency department and was given  a full strength aspirin.  He currently has no complaints  of shortness of  breath, palpitations, chest pain, dizziness, near syncope, syncope,  orthopnea, PND, or lower extremity edema.  He is resting comfortably in  his bed at the time of this talk.  He has multiple family members in the  room.  I asked him further about the episode last year.  He tells me  that came into the emergency department several days following a rotator  cuff repair when he had the onset of shortness of breath and  palpitations.  He was found during that emergency room visit to have  atrial fibrillation with rapid ventricular response.  It appears that he  was treated with intravenous diltiazem and was discharged to home after  he converted to a normal sinus rhythm.  The patient states that he was  supposed to follow up with his physician following this and was supposed  to get a stress test that he did not complete this plan as recommended.  He has had no other awareness of palpitations in  the meantime between  last year in September until last night.   His past medical history is significant for hyperthyroidism 12-13 years  ago.  He states that he underwent a nuclear study, which suggested a  thyroid abnormality, however, no medical therapy was recommended.  His  history is also positive for the remote episode of atrial fibrillation 1  year ago as described above.  He also has a history of the lower back  pain with radiation to his left buttock and thigh that is consistent  with sciatica.  There is a left inguinal hernia with a planned repair in  the near future.  The patient had a traumatic right shoulder rotator  cuff tear last year that required surgery.  There have been no other  surgical procedures performed.   ALLERGIES:  PERCOCET.   HOME MEDICATIONS:  The patient had been taking Celebrex, ibuprofen,  Flexeril, and Vicodin at home for his lower back and sciatica pain.  He  was not taking any of these things over the last 24 hours.   CURRENT HOSPITAL  MEDICATIONS:  1. Cardizem drip at 15 mg/hour.  2. Heparin drip.  3. Aspirin 325 mg once daily.   SOCIAL HISTORY:  The patient is married and lives with his wife.  He is  an Nutritional therapist.  He denies use of tobacco, alcohol, or illicit  drugs.   The patient's mother died from a pulmonary embolism and his father died  from Alzheimer's.  There is no family history of coronary artery disease  or other cardiac abnormalities.  He does have 1 brother who has diabetes  mellitus.  He has 4 sisters that are all healthy.   REVIEW OF SYSTEMS:  As described in the history of present illness and  is otherwise negative.   PHYSICAL EXAMINATION:  GENERAL:  He is a pleasant middle-aged Caucasian  male in no acute distress.  VITAL SIGNS:  Temperature 97.6, pulse 80-100, respiratory rate 12-16,  blood pressure 109/74, and oxygen saturation 98% on room air.  NECK:  No JVD.  No carotid bruits.  No lymphadenopathy.  No thyromegaly.  SKIN:  Warm and dry.  HEENT:  Oropharynx is clear.  Mucous membranes are moist.  LUNGS:  Clear to auscultation bilaterally without wheezes, rhonchi, or  crackles noted.  CARDIOVASCULAR:  Irregular without murmurs, gallops, or rubs noted.  ABDOMEN:  Soft, nontender, and nondistended.  Bowel sounds present.  No  organomegaly noted.  EXTREMITIES:  No evidence of edema.  Pulses are regular and 2+ in all  extremities.   DIAGNOSTIC STUDIES:  1. Chest x-ray, no acute disease.  2. A 12-lead electrocardiogram with atrial fibrillation, ventricular      rate of 150 beats per minute.   LABORATORY VALUES:  On admission show a hemoglobin of 18.2, platelets  189, and white blood cell count 9.9.  Potassium 4.1 and creatinine 0.91.  Troponin less than 0.01.  INR 1.0.   ASSESSMENT/PLAN:  This is a pleasant 70 year old Caucasian male with 1  episode of atrial fibrillation 1 year ago, who presents to the emergency  department with complaints of shortness of breath and palpitations  and  is found to be in atrial fibrillation with rapid ventricular response.  The patient was started on a Cardizem drip in the emergency department,  currently is rate control.  He was also started on a heparin drip for  anticoagulation and was given a full-strength aspirin in the emergency  department.  At the current time,  it is unclear of the etiology of his  atrial fibrillation.  The patient has been in a great deal of pain at  home with his sciatica.  I would like to perform surface echocardiogram  to rule out any structural heart disease.  Plans have already been made  to check a thyroid function level.  The patient will be monitored on  telemetry and ruled out for myocardial infarction with serial  cardiac enzymes.  We will continue to follow along.  The patient will be  continued on Cardizem intravenously as well as heparin intravenously for  anticoagulation.  Further cardiac recommendations pending the results of  this testing procedures.      Verne Carrow, MD  Electronically Signed     CM/MEDQ  D:  09/04/2008  T:  09/05/2008  Job:  [id number]

## 2011-04-15 NOTE — Op Note (Signed)
NAME:  Ronald Kelley, Ronald Kelley                ACCOUNT NO.:  [id number]   MEDICAL RECORD NO.:  [id number]          PATIENT TYPE:  INP   LOCATION:  3037                         FACILITY:  [facility]   PHYSICIAN:  Stefani Dama, M.D.  DATE OF BIRTH:  06/30/1941   DATE OF PROCEDURE:  09/21/2008  DATE OF DISCHARGE:                               OPERATIVE REPORT   PREOPERATIVE DIAGNOSIS:  Herniated nucleus pulposus L4-L5 left with left  lumbar radiculopathy.   POSTOPERATIVE DIAGNOSIS:  Herniated nucleus pulposus L4-L5 left with  left lumbar radiculopathy.   PROCEDURES:  Lumbar microdiskectomy L4-L5 left with operating microscope  microdissection technique.   SURGEON:  Stefani Dama, MD   FIRST ASSISTANT:  Danae Orleans. Venetia Maxon, MD   ANESTHESIA:  General endotracheal.   INDICATIONS:  Ronald Kelley is a 70 year old individual who has had  significant left lumbar radiculopathy in L5 distribution.  He has  evidence of a disk herniation behind the body of L5 and under the nerve  root of L5.  He has been advised regarding surgical decompression.   PROCEDURE IN DETAIL:  The patient was brought to the operating room  supine on the stretcher.  After smooth induction of general endotracheal  anesthesia, he was turned prone.  Back was prepped with alcohol and  DuraPrep and draped in a sterile fashion.  Incision was created over the  interlaminar space at L4-L5 on the left side and then the subperiosteal  dissection was performed exposing the interlaminar space at L4-L5.  The  space was localized positively with a radiograph.  Laminotomy removing  the inferior marginal lamina of L4 at the medial wall of the facet was  then performed.  Common dural tube was identified and carefully lateral  aspect was dissected down.  The nerve root was noted be bowed dorsally  and laterally and by dissecting along the lateral aspect and retracting  nerve root medially, there was some disk material that was encountered.  Several  fragments were removed from this area in an on-spot fashion  along the posterior vertebral body of L5.  The disk space was then  explored carefully.  There was some bulging but there was no rent in the  disk space itself.  There was no evidence of other free fragments of  disk.  Exploration was performed under the operating microscope by both  myself and Dr. Venetia Maxon and when no other fragments of disk were  identified, the area was copiously irrigated with antibiotic irrigating  solution.  Hemostasis in the soft tissues was obtained meticulously and  then the lumbodorsal fascia was closed with #1 Vicryl in interrupted  fashion, 2-0 Vicryl was used in the subcutaneous tissues, and 3-0 Vicryl  subcuticularly.  Dermabond was placed on the skin.  The patient  tolerated the procedure well and was returned to the recovery room in  stable condition.      Stefani Dama, M.D.  Electronically Signed    HJE/MEDQ  D:  09/21/2008  T:  09/22/2008  Job:  [id number]

## 2011-04-15 NOTE — Assessment & Plan Note (Signed)
[facility]                         ELECTROPHYSIOLOGY OFFICE NOTE   NAME:Glander, HAWK MONES                       MRN:          [id number]  DATE:11/08/2008                            DOB:          01/03/1941    INTRODUCTION:  Mr. Benavides is a pleasant 70 year old gentleman with a  history of paroxysmal atrial fibrillation who presents today for  followup.   PROBLEM LIST:  1. Paroxysmal atrial fibrillation.  2. Sciatica with chronic left leg and ankle pain.  3. Degenerative disk disease.  4. Left inguinal hernia.  5. Remote history of hyperthyroidism.   CURRENT MEDICATIONS:  1. Lyrica 75 mg every other day.  2. Extra Strength Tylenol p.r.n.   INTERVAL HISTORY:  Mr. Schoenfelder presents today for followup after a recent  hospitalization for atrial fibrillation with rapid ventricular rates.  At that time, I saw the patient in consultation and he was successfully  converted to sinus rhythm with intravenous diltiazem.  The patient  reports one other episode of atrial fibrillation in the same setting of  severe left leg pain.  The patient is adamant that his atrial  fibrillation was caused by oxycodone, Vicodin, and Percocet  administration.  He feels that if he refrains from these medications  that his atrial fibrillation will not recur.  At that time, I  recommended aspirin 325 mg daily as well as diltiazem; however, the  patient has not been compliant with these therapies.   The patient has done however quite well from a cardiac standpoint.  He  denies any further chest pain, shortness of breath, palpitations,  presyncope, or syncope.  He has had no symptomatic episodes of atrial  fibrillation since hospital discharge.  His primary concern remains his  left ankle and foot pain which is chronic and severe.  He continues to  see multiple specialists regarding this.  He is otherwise without  complaint today.   PHYSICAL EXAMINATION:  VITALS:  Blood pressure  115/69, respirations 18,  weight 166.8 pounds, heart rate 66 beats per minute.  GENERAL:  The patient is a well-appearing male in no acute distress.  He  is alert and oriented x3.  HEENT:  Normocephalic, atraumatic.  Sclerae clear.  Conjunctivae pink.  Oropharynx clear.  NECK:  Supple.  No JVD, lymphadenopathy, or bruits.  LUNGS:  Clear to auscultation bilaterally.  HEART:  Regular rate and rhythm.  No murmurs, rubs, or gallops.  GASTROINTESTINAL:  Soft, nontender, nondistended.  Positive bowel  sounds.  EXTREMITIES:  No clubbing, cyanosis, or edema.  No obvious deformity or  atrophy.  SKIN:  No ecchymoses or lacerations.   EKG sinus rhythm at 66 beats per minute, otherwise unremarkable.   Transthoracic echocardiogram obtained October 6, reveals a left  ventricular end-diastolic dimension of 46 with a left atrial size of 37,  and an ejection fraction of 65% with no significant valvular  abnormalities.   IMPRESSION:  Mr. Goswami is a pleasant 70 year old gentleman with  symptomatic paroxysmal atrial fibrillation.  His atrial fibrillation  does seem to be exacerbated by symptoms of pain secondary to his  sciatica.  He  has had no symptomatic atrial fibrillation since recent  hospital discharge despite taking no medication for atrial fibrillation.   PLAN:  1. Again today, I have recommended that the patient take aspirin 325      mg daily for stroke prevention.  2. No other medication changes were made today.  3. The patient will follow up with me on an as-needed basis for his      atrial fibrillation.     Hillis Range, MD  Electronically Signed    JA/MedQ  DD: 11/08/2008  DT: 11/09/2008  Job #: [id number]

## 2011-04-15 NOTE — Op Note (Signed)
NAME:  Ronald Kelley, Ronald Kelley                ACCOUNT NO.:  [id number]   MEDICAL RECORD NO.:  [id number]          PATIENT TYPE:  AMB   LOCATION:  DSC                          FACILITY:  [facility]   PHYSICIAN:  Loreta Ave, M.D. DATE OF BIRTH:  October 05, 1941   DATE OF PROCEDURE:  08/19/2007  DATE OF DISCHARGE:                               OPERATIVE REPORT   PREOPERATIVE DIAGNOSIS:  Right shoulder acute on chronic massive  retracted rotator cuff tear, right shoulder.  Chronic impingement.  History of previous rupture partial long head biceps tendon.  Marked  degenerative arthritis, acromioclavicular joint.   POSTOPERATIVE DIAGNOSIS:  Right shoulder acute on chronic massive  retracted rotator cuff tear, right shoulder.  Chronic impingement.  History of previous rupture partial long head biceps tendon.  Marked  degenerative arthritis, acromioclavicular joint with only partially  reparable cuff from extensive tearing of the supraspinatus,  infraspinatus and subscapularis tendons.  Grade 2, mild grade 3  chondromalacia glenohumeral joint.   PROCEDURE:  Right shoulder exam under anesthesia, arthroscopy,  debridement glenohumeral joint, debridement remnant of biceps tendon,  debridement rotator cuff.  Acromioplasty, CA ligament release.  Excision  distal clavicle.  Mini open repair rotator cuff tear involving  advancement of the infraspinatus tendon and posterior aspect of  supraspinatus tendon up over top of the humeral head.  Debridement,  reattachment of biceps tendon at lateral humerus.   SURGEON:  Mckinley Jewel, M.D.   ASSISTANT:  Zonia Kief, PA.   ANESTHESIA:  General.   BLOOD LOSS:  Minimal.   SPECIMENS:  None.   COMPLICATIONS:  None.   DRESSING:  Soft compressive with shoulder immobilizer.   PROCEDURE:  The patient was brought to the operating room and placed on  the operating table in the supine position.  After adequate anesthesia  had been obtained, right shoulder examined.   Full motion and stable  shoulder.  A lot of gritty and crepitus.  Placed in beach-chair position  on a shoulder positioner, prepped and draped in the usual sterile  fashion.  Three portals, anterior, posterior and lateral.  Shoulder  entered with blunt obturator.  Arthroscope was induced.  Shoulder  distended and inspected.  Massive acute on chronic cuff tear.  The  subscap had a chronic tear, was subluxed down inferiorly.  Button hole  in the humeral head through this.  That was markedly attritional and any  effort arthroscopically to try to lift the subscap up just ripped off  portions of the atrophic tendon.  The supraspinatus had atrophic tears  going almost back to the musculotendinous junction.  This was still a  bit mobile and could be brought out laterally, but most of the anterior  half really was some relatively torn almost all the way back to muscle.  The infraspinatus had torn off and was opened like of book exposing the  top of the humeral head.  This could actually be brought back over top  of the humerus under the acromion and advanced almost to the bicipital  groove.  Marked degenerative changes throughout.  All debrided back to  healthy  tissue.  Despite the fact that I could not completely repair of  the cuff, I still felt that with assessment arthroscopically some  advance of the infraspinatus could at least provide humeral head  depression and prevent further migration of the humeral head.  Remnant  of biceps tendon was debrided out nonfunctional.  Acromioplasty from a  type 3 to a type 1 acromion.  Distal clavicle grade 4 changes spurring.  Periarticular spurs, loose bodies and lateral centimeter of clavicle  resected.  Adequacy of decompression confirmed.  After this fluid  removed.  Deltoid splitting incision exposing subacromial space.  I  mobilized the cuff as much as possible.  Bony trough was created in the  humerus and medialized.  A series of four FiberWire  sutures were weaved  in the infraspinatus and a portion of one was brought to the  supraspinatus.  After mobilizing this, I could bring this out laterally  so that I could cover about two-thirds of the head just leaving a little  anterior and medial uncovered.  Nothing I could do would advanced the  subscap up superiorly.  The biceps which had been debrided out of the  shoulder still had some distal attachment.  This was captured with some  FiberWire suture so I could just prevent further distal migration and  prevent further deformity if possible.  I then made a series of drill  holes across the tuberosity out laterally.  All sutures were weaved  through this.  Arm was abducted and externally rotated and sutures  pulled tightly and then firmly tied over bony bridge.  The sutures from  the top of the biceps also brought through a bony tunnel and tied down.  When that was complete, I had a firm repair and closure of what I could  fix.  It did bring the humeral head down into better position.  It  covered more than two-thirds of the head and again there was really  nothing I could do anteriorly.  Wound irrigated.  Deltoid closed Vicryl.  Skin and subcutaneous tissue closed with Vicryl.  Portals closed with  nylon.  Sterile compressive dressing applied.  Anesthesia reversed.  Brought to the recovery room.  A shoulder immobilizer had been applied  with an abduction pillow.  Tolerated surgery well without complications.      Loreta Ave, M.D.  Electronically Signed     DFM/MEDQ  D:  08/19/2007  T:  08/19/2007  Job:  [id number]

## 2011-04-18 NOTE — Discharge Summary (Signed)
NAME:  Ronald Kelley, Ronald Kelley                ACCOUNT NO.:  [id number]   MEDICAL RECORD NO.:  [id number]          PATIENT TYPE:  INP   LOCATION:  2924                         FACILITY:  [facility]   PHYSICIAN:  Vikki Ports A. Felicity Coyer, MDDATE OF BIRTH:  [birth year]   DATE OF ADMISSION:  09/04/2008  DATE OF DISCHARGE:  09/06/2008                               DISCHARGE SUMMARY   DISCHARGE DIAGNOSES:  1. Paroxysmal atrial fibrillation with rapid ventricular response.  2. Sciatica.  3. Left inguinal hernia.  4. Remote history of hyperthyroidism.   HISTORY OF PRESENT ILLNESS:  Mr. Foerster is a 70 year old male who was  admitted on September 01, 2008, with chief complaint of chest discomfort  and heart palpitations.  He presented to the [facility] ED and noted  that the symptoms began on evening prior to admission and lasted through  the night.  He had been seen in the emergency department 1 week prior to  the admission for sciatica and noted continuing pain.  He was ambulating  but with extreme difficulty secondary to pain in left buttock, which  radiates down his back and side of his leg and down to his toes.  He is  admitted for further evaluation and treatment.   COURSE OF HOSPITALIZATION:  1. Atrial fibrillation with rapid ventricular response, so the patient      was admitted.  He underwent cardiac enzymes, which were negative.      He was placed on IV Cardizem drip.  He was seen in consultation by      [facility], Dr. Verne Carrow.  TSH was within      normal limits.  He was ultimately placed on oral long-acting      diltiazem with stable rate.  Aspirin 325 was added to his regimen.  2. Sciatica.  The patient was arranged for followup with Dr. Danielle Dess on      September 08, 2008, for further discussion of sciatica and possibly      the epidural steroid injection.   MEDICATIONS AT THE TIME OF DISCHARGE:  1. Advil 400 mg p.o. q.6 h. as needed or Aleve.  2. Ultram 50 mg p.o. q.6 h. as  needed.  3. Flexeril 10 mg p.o. t.i.d. as needed.  4. Cardizem CD 120 mg p.o. daily.   He was instructed to return to the ER should he develop recurrent rapid  heart rate, palpitations, or shortness of breath and that he was to  start aspirin 325 mg p.o. daily after his epidural steroid injection.  He was set for followup  with Dr. Hillis Range on Wednesday, November 08, 2008, at 9:30 a.m. and  instructed to follow up with Dr. Willow Ora in 2 weeks and scheduled for  followup with Dr. Danielle Dess on Friday, September 08, 2008, at 8:45 a.m. this  is [place].   Greater than 30 minutes spent on discharge planning.      Sandford Craze, NP      Raenette Rover. Felicity Coyer, MD  Electronically Signed    MO/MEDQ  D:  10/20/2008  T:  10/21/2008  Job:  [id number]  cc:   Willow Ora, MD

## 2011-06-12 ENCOUNTER — Ambulatory Visit (INDEPENDENT_AMBULATORY_CARE_PROVIDER_SITE_OTHER): Payer: Medicare Other | Admitting: Internal Medicine

## 2011-06-12 ENCOUNTER — Encounter: Payer: Self-pay | Admitting: Internal Medicine

## 2011-06-12 DIAGNOSIS — J069 Acute upper respiratory infection, unspecified: Secondary | ICD-10-CM

## 2011-06-12 MED ORDER — AMOXICILLIN 500 MG PO CAPS: 1000.0000 mg | ORAL_CAPSULE | Freq: Two times a day (BID) | ORAL | Status: AC

## 2011-06-12 NOTE — Patient Instructions (Signed)
Rest, fluids , tylenol For cough, take Mucinex DM twice a day as needed  If the symptoms are not improving  in 3 - 4 days, start amoxicillin Call if no better in few days Call anytime if the symptoms are severe, you have high fever, short of breath

## 2011-06-12 NOTE — Progress Notes (Signed)
      Patient ID: Ronald Kelley, male    DOB: 1941-01-06, 70 y.o.   MRN: [id number]  HPI Symptoms started 3 days ago with subjective fever, sore throat, mild cough, sinus pressure, postnasal dripping and spitting up some mucus. Taking Mucinex. Past Medical History  Diagnosis Date  . Atrial fib/flutter, transient   . OSA on CPAP   . Degenerative disk disease   . Hyperthyroidism     remote h/o , no ablation  . Diabetes mellitus     a1c 6.5, 07/2008      Review of Systems Denies  headaches, mild myalgias with regular activities. No nausea, vomiting or diarrhea    Objective:   Physical Exam  Constitutional: He appears well-developed and well-nourished. No distress.  HENT:  Head: Normocephalic and atraumatic.  Right Ear: External ear normal.  Left Ear: External ear normal.  Mouth/Throat: No oropharyngeal exudate.       Face NTTP   Cardiovascular: Normal rate, regular rhythm and normal heart sounds.   No murmur heard. Pulmonary/Chest: Breath sounds normal. No respiratory distress. He has no wheezes. He has no rales.  Skin: He is not diaphoretic.          Assessment & Plan:  1. URI:  See instructions

## 2011-08-27 ENCOUNTER — Telehealth: Payer: Self-pay | Admitting: Internal Medicine

## 2011-08-27 NOTE — Telephone Encounter (Signed)
Patient is due for a routine office visit, we need to recheck his PSA.  Please schedule the OV

## 2011-08-30 NOTE — Telephone Encounter (Signed)
please call pt

## 2011-09-01 LAB — CARDIAC PANEL(CRET KIN+CKTOT+MB+TROPI)
CK, MB: 1.6
CK, MB: 2.3
Relative Index: INVALID
Total CK: 56
Troponin I: 0.04

## 2011-09-01 LAB — POCT I-STAT, CHEM 8
BUN: 18
Calcium, Ion: 1.18
Creatinine, Ser: 1
Creatinine, Ser: 1
Glucose, Bld: 129 — ABNORMAL HIGH
Hemoglobin: 16
Hemoglobin: 18.7 — ABNORMAL HIGH
Potassium: 3.6
Sodium: 142
TCO2: 26
TCO2: 30

## 2011-09-01 LAB — LIPID PANEL
Cholesterol: 118
LDL Cholesterol: 76
Total CHOL/HDL Ratio: 4.2
Triglycerides: 72

## 2011-09-01 LAB — CBC
HCT: 46.4
HCT: 54.4 — ABNORMAL HIGH
Hemoglobin: 15.8
Hemoglobin: 16
Hemoglobin: 18.2 — ABNORMAL HIGH
MCHC: 33.3
MCHC: 34
MCV: 92.3
RBC: 5.07
RBC: 5.22
RBC: 5.96 — ABNORMAL HIGH
RDW: 12.7
RDW: 12.9

## 2011-09-01 LAB — HEPATIC FUNCTION PANEL
AST: 21
Bilirubin, Direct: 0.3
Total Bilirubin: 1.4 — ABNORMAL HIGH

## 2011-09-01 LAB — BASIC METABOLIC PANEL
CO2: 27
CO2: 28
Calcium: 10.6 — ABNORMAL HIGH
Calcium: 10.8 — ABNORMAL HIGH
Chloride: 105
Creatinine, Ser: 1.08
GFR calc Af Amer: >60
GFR calc Af Amer: >60
GFR calc non Af Amer: >60
Glucose, Bld: 174 — ABNORMAL HIGH
Potassium: 4.1
Sodium: 139
Sodium: 142

## 2011-09-01 LAB — D-DIMER, QUANTITATIVE: D-Dimer, Quant: 0.35

## 2011-09-01 LAB — PROTIME-INR
INR: 1
Prothrombin Time: 13.6

## 2011-09-01 LAB — CK TOTAL AND CKMB (NOT AT ARMC)
CK, MB: 2.1
Total CK: 91

## 2011-09-01 LAB — POCT CARDIAC MARKERS: CKMB, poc: 1.2

## 2011-09-01 LAB — APTT: aPTT: 30

## 2011-09-05 ENCOUNTER — Encounter: Payer: Self-pay | Admitting: Internal Medicine

## 2011-09-05 NOTE — Telephone Encounter (Signed)
Left several phone messages for patient to schedule appt - mailed letter

## 2011-09-11 LAB — DIFFERENTIAL
Basophils Absolute: 0
Basophils Relative: 0
Eosinophils Absolute: 0
Monocytes Relative: 7
Neutro Abs: 10.5 — ABNORMAL HIGH
Neutrophils Relative %: 83 — ABNORMAL HIGH

## 2011-09-11 LAB — POCT CARDIAC MARKERS
CKMB, poc: 1.8
Myoglobin, poc: 133
Troponin i, poc: <0.05

## 2011-09-11 LAB — I-STAT 8, (EC8 V) (CONVERTED LAB)
Acid-Base Excess: 3 — ABNORMAL HIGH
Bicarbonate: 25.8 — ABNORMAL HIGH
HCT: 49
Hemoglobin: 16.7
Operator id: 277751
Sodium: 138
TCO2: 27
pCO2, Ven: 34.2 — ABNORMAL LOW

## 2011-09-11 LAB — POCT I-STAT CREATININE: Creatinine, Ser: 0.7

## 2011-09-11 LAB — CBC
MCHC: 33.7
MCV: 89.4
Platelets: 201
RDW: 12.7

## 2011-09-15 ENCOUNTER — Encounter: Payer: Self-pay | Admitting: Internal Medicine

## 2011-09-15 ENCOUNTER — Ambulatory Visit (INDEPENDENT_AMBULATORY_CARE_PROVIDER_SITE_OTHER): Payer: Medicare Other | Admitting: Internal Medicine

## 2011-09-15 DIAGNOSIS — E119 Type 2 diabetes mellitus without complications: Secondary | ICD-10-CM

## 2011-09-15 DIAGNOSIS — R972 Elevated prostate specific antigen [PSA]: Secondary | ICD-10-CM | POA: Insufficient documentation

## 2011-09-15 NOTE — Progress Notes (Signed)
  Subjective:    Patient ID: Ronald Kelley, male    DOB: 1941-11-24, 70 y.o.   MRN: [id number]  HPI ROV Was unable to use CPAP d/t nasal congestion x 3 weeks, getting better  C/o tingling in the L great toe since back surgery in the past  Past Medical History  Diagnosis Date  . Atrial fib/flutter, transient   . OSA on CPAP   . Degenerative disk disease   . Hyperthyroidism     remote h/o , no ablation  . Diabetes mellitus     a1c 6.5, 07/2008   Past Surgical History  Procedure Date  . Rotator cuff repair   . Hernia repair 2010  . L5 acute hnp     s/p surgery 09/21/08-- still has occasional paretheisa of the left foot     Review of Systems H/o DM, on diet only, no amb CBGs, diet-life style very good Saw urology, notes reviewed. Nasal congestion but denies any fever, cough, chest congestion     Objective:   Physical Exam  Constitutional: He appears well-developed and well-nourished. No distress.  Cardiovascular: Normal rate, regular rhythm and normal heart sounds.   No murmur heard. Pulmonary/Chest: Effort normal and breath sounds normal. No respiratory distress. He has no wheezes. He has no rales.  Musculoskeletal:       DIABETIC FEET EXAM: No lower extremity edema Normal pedal pulses bilaterally Skin normal, nails thick but well trimmed, some calluses Pinprick examination of the feet -- decreased,around the L great toe    Skin: He is not diaphoretic.          Assessment & Plan:

## 2011-09-15 NOTE — Assessment & Plan Note (Addendum)
Encouraged diet and exercise, lab. Has an area in the left foot with decreased sensitivity, feet care discussed

## 2011-09-15 NOTE — Assessment & Plan Note (Signed)
Last year, PSA velocity was increased, went to see urology 04-2011, he was noted to have microhematuria. PSA went back to normal Cystoscopy was normal Additionally he has some stones on the CT (asymptomatic) There recommended a followup with urology 03/2012.

## 2011-10-08 ENCOUNTER — Ambulatory Visit (INDEPENDENT_AMBULATORY_CARE_PROVIDER_SITE_OTHER): Payer: Medicare Other | Admitting: Internal Medicine

## 2011-10-08 ENCOUNTER — Encounter: Payer: Self-pay | Admitting: Internal Medicine

## 2011-10-08 VITALS — BP 124/76 | HR 76 | Temp 98.3°F | Ht 67.0 in | Wt 170.0 lb

## 2011-10-08 DIAGNOSIS — N529 Male erectile dysfunction, unspecified: Secondary | ICD-10-CM

## 2011-10-08 NOTE — Progress Notes (Signed)
  Subjective:    Patient ID: Ronald Kelley, male    DOB: 02-27-1941, 70 y.o.   MRN: [id number]  HPI History of ED for at least 2 years, here for evaluation for a vacuum device . In the past he tried viagra and had  severe headaches. He is not willing to try again. Has a history of diabetes  Past Medical History  Diagnosis Date  . Atrial fib/flutter, transient   . OSA on CPAP   . Degenerative disk disease   . Hyperthyroidism     remote h/o , no ablation  . Diabetes mellitus     a1c 6.5, 07/2008   Past Surgical History  Procedure Date  . Rotator cuff repair   . Hernia repair 2010  . L5 acute hnp     s/p surgery 09/21/08-- still has occasional paretheisa of the left foot      Review of Systems Denies chest or shortness of breath No claudication No difficulty urinating or blood in the urine.     Objective:   Physical Exam  Constitutional: He appears well-developed and well-nourished.  Cardiovascular:       Normal femoral pulses bilaterally  Pulmonary/Chest: Effort normal.  Abdominal: Soft. He exhibits no distension. There is no tenderness. There is no rebound and no guarding.  Musculoskeletal: He exhibits no edema.        Assessment & Plan:

## 2011-10-08 NOTE — Assessment & Plan Note (Signed)
h/o diabetes, problems with ED. Intolerant to Viagra. I agreed to prescribe a vacuum device

## 2011-12-23 ENCOUNTER — Ambulatory Visit (INDEPENDENT_AMBULATORY_CARE_PROVIDER_SITE_OTHER): Payer: Medicare Other | Admitting: Internal Medicine

## 2011-12-23 ENCOUNTER — Encounter: Payer: Self-pay | Admitting: Internal Medicine

## 2011-12-23 VITALS — BP 120/78 | HR 69 | Temp 98.0°F | Resp 16 | Wt 171.5 lb

## 2011-12-23 DIAGNOSIS — R059 Cough, unspecified: Secondary | ICD-10-CM

## 2011-12-23 DIAGNOSIS — R05 Cough: Secondary | ICD-10-CM

## 2011-12-23 MED ORDER — AZITHROMYCIN 250 MG PO TABS: ORAL_TABLET | ORAL | Status: AC

## 2011-12-23 NOTE — Patient Instructions (Signed)
Rest, fluids , tylenol For cough, take Mucinex DM twice a day as needed  Dymista: 2 sprays on each side of the nose at night until samples gone Allegra (w/o the "D") once a day is ok Take the antibiotic as prescribed ----> zithromax Call if no better in few days Call anytime if the symptoms are severe

## 2011-12-23 NOTE — Progress Notes (Signed)
  Subjective:    Patient ID: Ronald Kelley, male    DOB: [birth year], 71 y.o.   MRN: [id number]  HPI Acute visit Complains of cough for the last [redacted] weeks along with nasal congestion, mild chest congestion. Occasionally produce nasal discharge and sputum, it can be clear, yellow or green. Has tried different things like Allegra-D, Mucinex DM, Coricidin et Karie Soda. None of them gave him a lot of relief.   Past Medical History: Paroxysmal atrial fibrillation  AODM A1C 6.5 07-2008 OSA-- on CPAP h/o acute HNP, s/p surgery 09-21-08 occasional paresthesia of the left foot Degenerative disk disease.  Remote history of hyperthyroidism.   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 Denies any aches or headache    Objective:   Physical Exam  Constitutional: He is oriented to person, place, and time. He appears well-developed and well-nourished. No distress.  HENT:  Head: Normocephalic and atraumatic.  Right Ear: External ear normal.  Left Ear: External ear normal.       Nose slightly congested, face symmetric and nontender, throat normal    Cardiovascular: Normal rate, regular rhythm and normal heart sounds.   No murmur heard. Pulmonary/Chest: Effort normal and breath sounds normal. No respiratory distress. He has no wheezes. He has no rales.  Musculoskeletal: He exhibits no edema.  Neurological: He is alert and oriented to person, place, and time.  Skin: He is not diaphoretic.      Assessment & Plan:  Cough: 3 weeks history of a cough, no fever.  He does have some sinus congestion but he has no facial tenderness  . After 3 weeks of sx , I wonder about an atypical infection. Plan: See instructions

## 2012-01-13 DIAGNOSIS — M5137 Other intervertebral disc degeneration, lumbosacral region: Secondary | ICD-10-CM | POA: Diagnosis not present

## 2012-01-13 DIAGNOSIS — IMO0002 Reserved for concepts with insufficient information to code with codable children: Secondary | ICD-10-CM | POA: Diagnosis not present

## 2012-01-13 DIAGNOSIS — M999 Biomechanical lesion, unspecified: Secondary | ICD-10-CM | POA: Diagnosis not present

## 2012-01-27 DIAGNOSIS — L570 Actinic keratosis: Secondary | ICD-10-CM | POA: Diagnosis not present

## 2012-01-27 DIAGNOSIS — D485 Neoplasm of uncertain behavior of skin: Secondary | ICD-10-CM | POA: Diagnosis not present

## 2012-03-08 ENCOUNTER — Telehealth: Payer: Self-pay | Admitting: Pulmonary Disease

## 2012-03-08 DIAGNOSIS — G4733 Obstructive sleep apnea (adult) (pediatric): Secondary | ICD-10-CM

## 2012-03-08 NOTE — Telephone Encounter (Signed)
Order placed for new supplies and sent to [facility].

## 2012-04-06 DIAGNOSIS — R972 Elevated prostate specific antigen [PSA]: Secondary | ICD-10-CM | POA: Diagnosis not present

## 2012-05-05 ENCOUNTER — Encounter: Payer: Self-pay | Admitting: Pulmonary Disease

## 2012-05-05 ENCOUNTER — Ambulatory Visit (INDEPENDENT_AMBULATORY_CARE_PROVIDER_SITE_OTHER): Payer: Medicare Other | Admitting: Pulmonary Disease

## 2012-05-05 VITALS — BP 132/70 | HR 87 | Temp 98.5°F | Ht 67.0 in | Wt 169.4 lb

## 2012-05-05 DIAGNOSIS — G4733 Obstructive sleep apnea (adult) (pediatric): Secondary | ICD-10-CM | POA: Diagnosis not present

## 2012-05-05 NOTE — Assessment & Plan Note (Signed)
The patient has been trying to wear her CPAP, but continues to have issues with tolerance.  He is questioning the mask fit, and also feels the pressure is too high when he first puts the mask on to try and go to sleep.  He is also having a lot of allergy issues that interfere with his compliance.  At this point, I would like to work on nasal hygiene, have his DME look at the mask fit, and also to show him how to use the ramp feature on his CPAP machine that will help with pressure tolerates.  He is to call me if he continues to have issues with tolerance.

## 2012-05-05 NOTE — Progress Notes (Signed)
  Subjective:    Patient ID: Ronald Kelley, male    DOB: [birth year], 71 y.o.   MRN: [id number]  HPI The patient comes in today for followup of his known obstructive sleep apnea.  He is having multiple issues with his CPAP device, which is leading to decreased compliance.  He is having a lot of nasal issues and stuffiness, is having decreased tolerance of CPAP pressure, and finally he is having issues with mask fit.   Review of Systems  Constitutional: Negative.  Negative for fever and unexpected weight change.  HENT: Positive for sneezing and postnasal drip. Negative for ear pain, nosebleeds, congestion, sore throat, rhinorrhea, trouble swallowing, dental problem and sinus pressure.   Eyes: Negative.  Negative for redness and itching.  Respiratory: Negative.  Negative for cough, chest tightness, shortness of breath and wheezing.   Cardiovascular: Negative.  Negative for palpitations and leg swelling.  Gastrointestinal: Negative.  Negative for nausea and vomiting.  Genitourinary: Negative.  Negative for dysuria.  Musculoskeletal: Negative.  Negative for joint swelling.  Skin: Negative.  Negative for rash.  Neurological: Negative.  Negative for headaches.  Hematological: Negative.  Does not bruise/bleed easily.  Psychiatric/Behavioral: Negative.  Negative for dysphoric mood. The patient is not nervous/anxious.        Objective:   Physical Exam Well-developed male in no acute distress No skin breakdown or pressure necrosis from the CPAP mask Lower extremities without edema, no cyanosis Alert, does not appear to be sleepy, moves all 4 extremities.       Assessment & Plan:

## 2012-05-05 NOTE — Patient Instructions (Signed)
Take nasonex 2 sprays each nostril each am until allergy season is over. Take zyrtec 10mg  each night at bedtime. Use nasal saline spray to rinse out nose before putting on cpap mask Will get you back to dme to show you how to use ramp button, and also to look at mask fit. If doing well, followup with me in one year.  If not doing well, please call me.

## 2012-06-02 DIAGNOSIS — R972 Elevated prostate specific antigen [PSA]: Secondary | ICD-10-CM | POA: Diagnosis not present

## 2012-06-02 DIAGNOSIS — N402 Nodular prostate without lower urinary tract symptoms: Secondary | ICD-10-CM | POA: Diagnosis not present

## 2012-07-05 DIAGNOSIS — N402 Nodular prostate without lower urinary tract symptoms: Secondary | ICD-10-CM | POA: Diagnosis not present

## 2012-07-05 DIAGNOSIS — R972 Elevated prostate specific antigen [PSA]: Secondary | ICD-10-CM | POA: Diagnosis not present

## 2012-08-29 ENCOUNTER — Encounter (HOSPITAL_COMMUNITY): Payer: Self-pay | Admitting: *Deleted

## 2012-08-29 ENCOUNTER — Emergency Department (HOSPITAL_COMMUNITY): Payer: Medicare Other

## 2012-08-29 ENCOUNTER — Emergency Department (HOSPITAL_COMMUNITY)
Admission: EM | Admit: 2012-08-29 | Discharge: 2012-08-29 | Disposition: A | Discharge status: Home / Self Care | Payer: Medicare Other | Attending: Emergency Medicine | Admitting: Emergency Medicine

## 2012-08-29 DIAGNOSIS — R109 Unspecified abdominal pain: Secondary | ICD-10-CM | POA: Diagnosis not present

## 2012-08-29 DIAGNOSIS — E119 Type 2 diabetes mellitus without complications: Secondary | ICD-10-CM | POA: Insufficient documentation

## 2012-08-29 DIAGNOSIS — IMO0002 Reserved for concepts with insufficient information to code with codable children: Secondary | ICD-10-CM | POA: Insufficient documentation

## 2012-08-29 DIAGNOSIS — N200 Calculus of kidney: Secondary | ICD-10-CM | POA: Diagnosis not present

## 2012-08-29 DIAGNOSIS — G4733 Obstructive sleep apnea (adult) (pediatric): Secondary | ICD-10-CM | POA: Diagnosis not present

## 2012-08-29 LAB — URINALYSIS, ROUTINE W REFLEX MICROSCOPIC
Glucose, UA: 100 mg/dL — AB
Ketones, ur: 15 mg/dL — AB
Leukocytes, UA: NEGATIVE
pH: 7.5 (ref 5.0–8.0)

## 2012-08-29 LAB — URINE MICROSCOPIC-ADD ON

## 2012-08-29 MED ORDER — KETOROLAC TROMETHAMINE 30 MG/ML IJ SOLN
60.0000 mg | Freq: Once | INTRAMUSCULAR | Status: AC
  Administered 2012-08-29: 60 mg via INTRAMUSCULAR
  Filled 2012-08-29: qty 2

## 2012-08-29 MED ORDER — HYDROMORPHONE HCL 4 MG PO TABS: 4.0000 mg | ORAL_TABLET | ORAL | Status: DC | PRN

## 2012-08-29 MED ORDER — IBUPROFEN 600 MG PO TABS: 600.0000 mg | ORAL_TABLET | Freq: Four times a day (QID) | ORAL | Status: DC | PRN

## 2012-08-29 NOTE — ED Notes (Signed)
Pt states hx of kidney stones, usually once or twice a year and this time. Pt states he believes that he has another one and it is moving. Pt having pain right side and pain across front of stomach but isn't having pain right now. Pt states pain gets bad enough and he vomits.

## 2012-08-29 NOTE — ED Notes (Signed)
Patient brought back to waiting room from triage; currently resting quietly and talking with family members. Will continue to monitor.

## 2012-08-29 NOTE — ED Provider Notes (Addendum)
History     CSN: [id number]  Arrival date & time 08/29/12  4098   First MD Initiated Contact with Patient 08/29/12 2121      Chief Complaint  Patient presents with  . Flank Pain     HPI Pt states hx of kidney stones, usually once or twice a year and this time. Pt states he believes that he has another one and it is moving. Pt having pain right side and pain across front of stomach but isn't having pain right now. Pt states pain gets bad enough and he vomits.  Past Medical History  Diagnosis Date  . Atrial fib/flutter, transient   . OSA on CPAP   . Degenerative disk disease   . Hyperthyroidism     remote h/o , no ablation  . Diabetes mellitus     a1c 6.5, 07/2008    Past Surgical History  Procedure Date  . Rotator cuff repair   . Hernia repair 2010  . L5 acute hnp     s/p surgery 09/21/08-- still has occasional paretheisa of the left foot     Family History  Problem Relation Age of Onset  . Hypertension Mother   . Diabetes Brother   . Coronary artery disease Neg Hx   . Stroke Neg Hx   . Colon cancer Neg Hx   . Prostate cancer Neg Hx   . Alzheimer's disease Father 67    History  Substance Use Topics  . Smoking status: Never Smoker   . Smokeless tobacco: Not on file  . Alcohol Use: Not on file      Review of Systems  All other systems reviewed and are negative.    Allergies  Hydrocodone-acetaminophen and Oxycodone-acetaminophen  Home Medications   Current Outpatient Rx  Name Route Sig Dispense Refill  . DILAUDID PO Oral Take 1 tablet by mouth every 6 (six) hours as needed. For pain    . HYDROMORPHONE HCL 4 MG PO TABS Oral Take 1 tablet (4 mg total) by mouth every 4 (four) hours as needed for pain. 30 tablet 0  . IBUPROFEN 600 MG PO TABS Oral Take 1 tablet (600 mg total) by mouth every 6 (six) hours as needed for pain. 30 tablet 0    BP 120/73  Pulse 51  Temp 98.1 F (36.7 C) (Oral)  Resp 18  SpO2 98%  Physical Exam  Nursing note and vitals  reviewed. Constitutional: He is oriented to person, place, and time. He appears well-developed. No distress.  HENT:  Head: Normocephalic and atraumatic.  Eyes: Pupils are equal, round, and reactive to light.  Neck: Normal range of motion.  Cardiovascular: Normal rate and intact distal pulses.   Pulmonary/Chest: No respiratory distress.  Abdominal: Normal appearance. He exhibits no distension. There is no tenderness. There is no rebound.  Musculoskeletal: Normal range of motion.  Neurological: He is alert and oriented to person, place, and time. No cranial nerve deficit.  Skin: Skin is warm and dry. No rash noted.  Psychiatric: He has a normal mood and affect. His behavior is normal.    ED Course  Procedures (including critical care time)   Medications  HYDROmorphone HCl (DILAUDID PO) (not administered)  ibuprofen (ADVIL,MOTRIN) 600 MG tablet (not administered)  HYDROmorphone (DILAUDID) 4 MG tablet (not administered)  ketorolac (TORADOL) 30 MG/ML injection 60 mg (60 mg Intramuscular Given 08/29/12 2242)    Labs Reviewed  URINALYSIS, ROUTINE W REFLEX MICROSCOPIC - Abnormal; Notable for the following:    APPearance  CLOUDY (*)     Glucose, UA 100 (*)     Hgb urine dipstick MODERATE (*)     Ketones, ur 15 (*)     All other components within normal limits  URINE MICROSCOPIC-ADD ON  LAB REPORT - SCANNED   No results found.   1. Kidney stone       MDM   After treatment in the ED the patient feels back to baseline and wants to go home.       Nelia Shi, MD 09/01/12 0000  Nelia Shi, MD 09/17/12 [phone]

## 2012-09-02 DIAGNOSIS — N201 Calculus of ureter: Secondary | ICD-10-CM | POA: Diagnosis not present

## 2012-09-05 ENCOUNTER — Encounter (HOSPITAL_COMMUNITY): Payer: Self-pay | Admitting: Emergency Medicine

## 2012-09-05 ENCOUNTER — Emergency Department (HOSPITAL_COMMUNITY)
Admission: EM | Admit: 2012-09-05 | Discharge: 2012-09-05 | Disposition: A | Discharge status: Home / Self Care | Payer: Medicare Other | Attending: Emergency Medicine | Admitting: Emergency Medicine

## 2012-09-05 ENCOUNTER — Emergency Department (HOSPITAL_COMMUNITY): Payer: Medicare Other

## 2012-09-05 DIAGNOSIS — R109 Unspecified abdominal pain: Secondary | ICD-10-CM | POA: Insufficient documentation

## 2012-09-05 DIAGNOSIS — N201 Calculus of ureter: Secondary | ICD-10-CM | POA: Diagnosis not present

## 2012-09-05 DIAGNOSIS — N200 Calculus of kidney: Secondary | ICD-10-CM | POA: Diagnosis not present

## 2012-09-05 DIAGNOSIS — K5900 Constipation, unspecified: Secondary | ICD-10-CM | POA: Diagnosis not present

## 2012-09-05 DIAGNOSIS — K5730 Diverticulosis of large intestine without perforation or abscess without bleeding: Secondary | ICD-10-CM | POA: Diagnosis not present

## 2012-09-05 LAB — URINALYSIS, ROUTINE W REFLEX MICROSCOPIC
Bilirubin Urine: NEGATIVE
Ketones, ur: NEGATIVE mg/dL
Nitrite: NEGATIVE
Specific Gravity, Urine: 1.012 (ref 1.005–1.030)
Urobilinogen, UA: 1 mg/dL (ref 0.0–1.0)

## 2012-09-05 NOTE — ED Notes (Signed)
EDP at [facility], plan explained, pt and family agreeable, (Denies: pain, nausea or sob), waiting on CT, into gown.

## 2012-09-05 NOTE — ED Notes (Signed)
To CT

## 2012-09-05 NOTE — ED Notes (Signed)
Pt c/o flank and abd pain from known right kidney stone; pt sts increased pain and no BM since taking pain meds

## 2012-09-05 NOTE — ED Notes (Signed)
EDP at [facility], pending CT (has not been to CT yet).

## 2012-09-05 NOTE — ED Notes (Signed)
Patient with recent dx of kidney stones,  He has been taking dilaudid for pain.  He states he is continuing to have right side pain.  He feels that he has had decreased amount of bm and has abd pain, and fullness.  He has had enema x 2 and suppository w/o much relief.  States his bm has been small.  Patient denies any diff voiding

## 2012-09-05 NOTE — ED Provider Notes (Signed)
History     CSN: [id number]  Arrival date & time 09/05/12  1432   First MD Initiated Contact with Patient 09/05/12 1913      Chief Complaint  Patient presents with  . Nephrolithiasis  . Abdominal Pain  . Flank Pain    (Consider location/radiation/quality/duration/timing/severity/associated sxs/prior treatment) HPI Comments: Patient seen one week ago for renal calculus diagnosed by plain films.  He was discharged with pain meds but continues to have pain in the flank.  He denies fevers or hematuria.  He does report some constipation.    Patient is a 71 y.o. male presenting with abdominal pain. The history is provided by the patient.  Abdominal Pain The primary symptoms of the illness include nausea. The primary symptoms of the illness do not include fever or dysuria. Episode onset: one week ago. The onset of the illness was sudden. The problem has not changed since onset. The patient states that she believes she is currently not pregnant. The patient has not had a change in bowel habit. Symptoms associated with the illness do not include chills.    Past Medical History  Diagnosis Date  . Atrial fib/flutter, transient   . OSA on CPAP   . Degenerative disk disease   . Hyperthyroidism     remote h/o , no ablation  . Diabetes mellitus     a1c 6.5, 07/2008    Past Surgical History  Procedure Date  . Rotator cuff repair   . Hernia repair 2010  . L5 acute hnp     s/p surgery 09/21/08-- still has occasional paretheisa of the left foot     Family History  Problem Relation Age of Onset  . Hypertension Mother   . Diabetes Brother   . Coronary artery disease Neg Hx   . Stroke Neg Hx   . Colon cancer Neg Hx   . Prostate cancer Neg Hx   . Alzheimer's disease Father 57    History  Substance Use Topics  . Smoking status: Never Smoker   . Smokeless tobacco: Not on file  . Alcohol Use: Not on file      Review of Systems  Constitutional: Negative for fever and chills.    Gastrointestinal: Positive for nausea.  Genitourinary: Negative for dysuria.  All other systems reviewed and are negative.    Allergies  Hydrocodone-acetaminophen; Oxycodone; and Oxycodone-acetaminophen  Home Medications   Current Outpatient Rx  Name Route Sig Dispense Refill  . HYDROMORPHONE HCL 4 MG PO TABS Oral Take 1 tablet (4 mg total) by mouth every 4 (four) hours as needed for pain. 30 tablet 0  . IBUPROFEN 200 MG PO TABS Oral Take 600 mg by mouth every 6 (six) hours as needed. For pain      BP 153/68  Pulse 56  Temp 98 F (36.7 C) (Oral)  Resp 16  SpO2 99%  Physical Exam  Nursing note and vitals reviewed. Constitutional: He is oriented to person, place, and time. He appears well-developed. No distress.  HENT:  Head: Normocephalic and atraumatic.  Mouth/Throat: Oropharynx is clear and moist.  Neck: Normal range of motion. Neck supple.  Cardiovascular: Normal rate and regular rhythm.   No murmur heard. Pulmonary/Chest: Effort normal and breath sounds normal. No respiratory distress. He has no wheezes.  Abdominal: Soft. Bowel sounds are normal. He exhibits no distension.       There is right cva ttp.   Musculoskeletal: Normal range of motion.  Neurological: He is alert and oriented to  person, place, and time.  Skin: Skin is warm and dry. He is not diaphoretic.    ED Course  Procedures (including critical care time)  Labs Reviewed  URINALYSIS, ROUTINE W REFLEX MICROSCOPIC - Abnormal; Notable for the following:    Hgb urine dipstick MODERATE (*)     All other components within normal limits  URINE MICROSCOPIC-ADD ON   Dg Abd Acute W/chest  09/05/2012  *RADIOLOGY REPORT*  Clinical Data: Abdominal pain with constipation.  Right flank pain.  ACUTE ABDOMEN SERIES (ABDOMEN 2 VIEW & CHEST 1 VIEW)  Comparison: One-view abdomen 08/29/2012.  Abdominal pelvic CT 03/20/2011.  Findings: The heart size and mediastinal contours are stable.  The lungs are clear.  There is no  pleural effusion.  Prominence of the first costal chondral junctions is stable.  There has been previous distal right clavicle resection.  The bowel gas pattern is normal.  There is no free intraperitoneal air.  There are postsurgical changes in the pelvis.  Multiple large pelvic calcifications are grossly unchanged, likely phleboliths. There are stable degenerative changes in the lower lumbar spine.  IMPRESSION: No acute cardiopulmonary or abdominal process identified.  Multiple pelvic calcifications appear grossly unchanged.   Original Report Authenticated By: Gerrianne Scale, M.D.      No diagnosis found.   MDM  The patient continues with pain one week due to a suspected kidney stone.  This was seen by plain film.  As he has not improved in one week, I feel as though a ct scan to confirm this diagnosis is indicated.  This was performed and confirmed a 4mm stone in the right uvj.  He is also concerned that he may be constipated due to the pain meds he is taking.  I have advised him to follow up with his urologist this week to decide whether intervention is indicated.  He was also advised to attempt mag citrate for his constipation issues and be sure to take stool softeners with pain meds.  To return prn if he worsens.       Geoffery Lyons, MD 09/06/12 [phone]

## 2012-09-05 NOTE — ED Notes (Signed)
Back from CT, no changes.  

## 2012-09-22 DIAGNOSIS — N201 Calculus of ureter: Secondary | ICD-10-CM | POA: Diagnosis not present

## 2012-09-22 DIAGNOSIS — N400 Enlarged prostate without lower urinary tract symptoms: Secondary | ICD-10-CM | POA: Diagnosis not present

## 2012-09-22 DIAGNOSIS — N402 Nodular prostate without lower urinary tract symptoms: Secondary | ICD-10-CM | POA: Diagnosis not present

## 2012-09-22 DIAGNOSIS — R972 Elevated prostate specific antigen [PSA]: Secondary | ICD-10-CM | POA: Diagnosis not present

## 2012-09-24 DIAGNOSIS — M5137 Other intervertebral disc degeneration, lumbosacral region: Secondary | ICD-10-CM | POA: Diagnosis not present

## 2012-09-24 DIAGNOSIS — M999 Biomechanical lesion, unspecified: Secondary | ICD-10-CM | POA: Diagnosis not present

## 2012-09-24 DIAGNOSIS — IMO0002 Reserved for concepts with insufficient information to code with codable children: Secondary | ICD-10-CM | POA: Diagnosis not present

## 2012-10-01 DIAGNOSIS — H25099 Other age-related incipient cataract, unspecified eye: Secondary | ICD-10-CM | POA: Diagnosis not present

## 2012-10-21 DIAGNOSIS — D485 Neoplasm of uncertain behavior of skin: Secondary | ICD-10-CM | POA: Diagnosis not present

## 2012-10-21 DIAGNOSIS — L570 Actinic keratosis: Secondary | ICD-10-CM | POA: Diagnosis not present

## 2013-01-10 DIAGNOSIS — N400 Enlarged prostate without lower urinary tract symptoms: Secondary | ICD-10-CM | POA: Diagnosis not present

## 2013-03-30 DIAGNOSIS — N400 Enlarged prostate without lower urinary tract symptoms: Secondary | ICD-10-CM | POA: Diagnosis not present

## 2013-03-30 DIAGNOSIS — L723 Sebaceous cyst: Secondary | ICD-10-CM | POA: Diagnosis not present

## 2013-03-30 DIAGNOSIS — D1801 Hemangioma of skin and subcutaneous tissue: Secondary | ICD-10-CM | POA: Diagnosis not present

## 2013-03-30 DIAGNOSIS — L570 Actinic keratosis: Secondary | ICD-10-CM | POA: Diagnosis not present

## 2013-04-27 ENCOUNTER — Ambulatory Visit (INDEPENDENT_AMBULATORY_CARE_PROVIDER_SITE_OTHER): Payer: Medicare Other | Admitting: Internal Medicine

## 2013-04-27 VITALS — BP 132/84 | HR 58 | Temp 98.0°F | Ht 67.0 in | Wt 168.0 lb

## 2013-04-27 DIAGNOSIS — G4733 Obstructive sleep apnea (adult) (pediatric): Secondary | ICD-10-CM | POA: Diagnosis not present

## 2013-04-27 DIAGNOSIS — E119 Type 2 diabetes mellitus without complications: Secondary | ICD-10-CM

## 2013-04-27 DIAGNOSIS — K219 Gastro-esophageal reflux disease without esophagitis: Secondary | ICD-10-CM

## 2013-04-27 DIAGNOSIS — Z0000 Encounter for general adult medical examination without abnormal findings: Secondary | ICD-10-CM | POA: Diagnosis not present

## 2013-04-27 DIAGNOSIS — R972 Elevated prostate specific antigen [PSA]: Secondary | ICD-10-CM | POA: Diagnosis not present

## 2013-04-27 DIAGNOSIS — I4891 Unspecified atrial fibrillation: Secondary | ICD-10-CM

## 2013-04-27 LAB — CBC WITH DIFFERENTIAL/PLATELET
Basophils Absolute: 0 10*3/uL (ref 0.0–0.1)
Basophils Relative: 0.3 % (ref 0.0–3.0)
Eosinophils Relative: 1.7 % (ref 0.0–5.0)
HCT: 46.3 % (ref 39.0–52.0)
Hemoglobin: 15.4 g/dL (ref 13.0–17.0)
Lymphocytes Relative: 32.8 % (ref 12.0–46.0)
Lymphs Abs: 2.8 10*3/uL (ref 0.7–4.0)
Monocytes Relative: 8.3 % (ref 3.0–12.0)
Neutro Abs: 4.8 10*3/uL (ref 1.4–7.7)
RBC: 5.1 Mil/uL (ref 4.22–5.81)
RDW: 13.2 % (ref 11.5–14.6)

## 2013-04-27 LAB — LIPID PANEL
HDL: 39.9 mg/dL (ref >39.00)
LDL Cholesterol: 116 mg/dL — ABNORMAL HIGH (ref 0–99)
Total CHOL/HDL Ratio: 4
Triglycerides: 92 mg/dL (ref 0.0–149.0)

## 2013-04-27 LAB — COMPREHENSIVE METABOLIC PANEL
ALT: 30 U/L (ref 0–53)
BUN: 16 mg/dL (ref 6–23)
CO2: 30 mEq/L (ref 19–32)
Creatinine, Ser: 1 mg/dL (ref 0.4–1.5)
GFR: 81.55 mL/min (ref >60.00)
Total Bilirubin: 1.4 mg/dL — ABNORMAL HIGH (ref 0.3–1.2)

## 2013-04-27 LAB — MICROALBUMIN / CREATININE URINE RATIO: Microalb Creat Ratio: 0.5 mg/g (ref 0.0–30.0)

## 2013-04-27 MED ORDER — ZOSTER VACCINE LIVE 19400 UNT/0.65ML ~~LOC~~ SOLR: 0.6500 mL | Freq: Once | SUBCUTANEOUS | Status: DC

## 2013-04-27 NOTE — Assessment & Plan Note (Addendum)
Check a hemoglobin A1c. Diet and exercise discussed Diabetic feet exam (-) except for onychomycosis, treatment versus observation discussed, rec observation. Has occasional tingling of the left great toe since back surgery, not likely neuropathy. Glucometer  provided

## 2013-04-27 NOTE — Assessment & Plan Note (Signed)
Off CPAP, plans to see pulmonology

## 2013-04-27 NOTE — Assessment & Plan Note (Signed)
Sees urology
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
## 2013-04-27 NOTE — Patient Instructions (Addendum)
Please read the information about heartburn prevention. Prilosec 20 mg OTC one tablet before breakfast for 2 weeks, then as needed. Call if the heartburn increases. Next visit in 6 months.   Fall Prevention and Home Safety Falls cause injuries and can affect all age groups. It is possible to use preventive measures to significantly decrease the likelihood of falls. There are many simple measures which can make your home safer and prevent falls. OUTDOORS  Repair cracks and edges of walkways and driveways.  Remove high doorway thresholds.  Trim shrubbery on the main path into your home.  Have good outside lighting.  Clear walkways of tools, rocks, debris, and clutter.  Check that handrails are not broken and are securely fastened. Both sides of steps should have handrails.  Have leaves, snow, and ice cleared regularly.  Use sand or salt on walkways during winter months.  In the garage, clean up grease or oil spills. BATHROOM  Install night lights.  Install grab bars by the toilet and in the tub and shower.  Use non-skid mats or decals in the tub or shower.  Place a plastic non-slip stool in the shower to sit on, if needed.  Keep floors dry and clean up all water on the floor immediately.  Remove soap buildup in the tub or shower on a regular basis.  Secure bath mats with non-slip, double-sided rug tape.  Remove throw rugs and tripping hazards from the floors. BEDROOMS  Install night lights.  Make sure a bedside light is easy to reach.  Do not use oversized bedding.  Keep a telephone by your bedside.  Have a firm chair with side arms to use for getting dressed.  Remove throw rugs and tripping hazards from the floor. KITCHEN  Keep handles on pots and pans turned toward the center of the stove. Use back burners when possible.  Clean up spills quickly and allow time for drying.  Avoid walking on wet floors.  Avoid hot utensils and knives.  Position shelves  so they are not too high or low.  Place commonly used objects within easy reach.  If necessary, use a sturdy step stool with a grab bar when reaching.  Keep electrical cables out of the way.  Do not use floor polish or wax that makes floors slippery. If you must use wax, use non-skid floor wax.  Remove throw rugs and tripping hazards from the floor. STAIRWAYS  Never leave objects on stairs.  Place handrails on both sides of stairways and use them. Fix any loose handrails. Make sure handrails on both sides of the stairways are as long as the stairs.  Check carpeting to make sure it is firmly attached along stairs. Make repairs to worn or loose carpet promptly.  Avoid placing throw rugs at the top or bottom of stairways, or properly secure the rug with carpet tape to prevent slippage. Get rid of throw rugs, if possible.  Have an electrician put in a light switch at the top and bottom of the stairs. OTHER FALL PREVENTION TIPS  Wear low-heel or rubber-soled shoes that are supportive and fit well. Wear closed toe shoes.  When using a stepladder, make sure it is fully opened and both spreaders are firmly locked. Do not climb a closed stepladder.  Add color or contrast paint or tape to grab bars and handrails in your home. Place contrasting color strips on first and last steps.  Learn and use mobility aids as needed. Install an electrical emergency response system.  Turn on lights to avoid dark areas. Replace light bulbs that burn out immediately. Get light switches that glow.  Arrange furniture to create clear pathways. Keep furniture in the same place.  Firmly attach carpet with non-skid or double-sided tape.  Eliminate uneven floor surfaces.  Select a carpet pattern that does not visually hide the edge of steps.  Be aware of all pets. OTHER HOME SAFETY TIPS  Set the water temperature for 120 F (48.8 C).  Keep emergency numbers on or near the telephone.  Keep smoke  detectors on every level of the home and near sleeping areas. Document Released: 11/07/2002 Document Revised: 05/18/2012 Document Reviewed: 02/06/2012 [facility] Patient Information 2014 [place], [place].

## 2013-04-27 NOTE — Assessment & Plan Note (Addendum)
Td 2009 pneumonia shot 2009  Got a Rx for shingles shot , never used , benefits discussed again, Rx reissued   Cscope 9-09, neg, next 10 years  diet and exercise discussed

## 2013-04-27 NOTE — Assessment & Plan Note (Signed)
History of remote A. Fib, asymptomatic, EKG sinus brady

## 2013-04-28 ENCOUNTER — Encounter: Payer: Self-pay | Admitting: Internal Medicine

## 2013-04-28 DIAGNOSIS — K219 Gastro-esophageal reflux disease without esophagitis: Secondary | ICD-10-CM | POA: Insufficient documentation

## 2013-04-28 NOTE — Assessment & Plan Note (Signed)
  Also complains of mild heartburn. Information about heartburn prevention and Prilosec as needed recommended. Will call if symptoms progress.

## 2013-04-29 ENCOUNTER — Telehealth: Payer: Self-pay | Admitting: *Deleted

## 2013-04-29 MED ORDER — METFORMIN HCL 500 MG PO TABS: 500.0000 mg | ORAL_TABLET | Freq: Two times a day (BID) | ORAL | Status: DC

## 2013-04-29 NOTE — Telephone Encounter (Signed)
Message copied by Verdene Rio on Fri Apr 29, 2013  2:31 PM ------      Message from: Willow Ora E      Created: Thu Apr 28, 2013  3:49 PM       Advise patient:      A1c has increased from 6.5 to 7.0.      Liver, kidney and blood count tests are normal.      Cholesterol is okay, LDL is 116, very close to his goal of 100.      It recommend to start metformin 500 mg twice a day (first week take only one a day in the morning), please call a prescription, side effects include nausea and diarrhea that usually goes away as he continue taking the medication.      Schedule a visit in 3 months.       ------

## 2013-04-29 NOTE — Telephone Encounter (Signed)
Rx sent, Discuss with patient  

## 2013-05-16 ENCOUNTER — Telehealth: Payer: Self-pay | Admitting: Pulmonary Disease

## 2013-05-16 NOTE — Telephone Encounter (Signed)
Spoke to pt. He states that he has not been using his CPAP machine. States that it has been months since he has used it because every time he puts it on his face it feels like it's suffocating him. Pt wants to know if it would be okay for him to take his machine to his DME to have them look at it. I advised that this would probably be his best option and if they can't fix it, we could possibly see about getting a new machine for him. He agreed and stated that he would take the machine by the DME tomorrow.

## 2013-08-04 IMAGING — CT CT ABD-PELV W/O CM
2 of 4 series · 16 of 46 positions shown, 18 images · non-contrast
Comparison: Abdominal CT - 03/20/2011

CLINICAL DATA: Nephrolithiasis, right lower abdominal and flank
pain

CT ABDOMEN AND PELVIS WITHOUT CONTRAST
TECHNIQUE: Multidetector CT imaging of the abdomen and pelvis was
performed following the standard protocol without intravenous
contrast.

[Series 2: stone >200 lbs 5.0 b31f st · axial · 0.72mm/px · z∈[+800,+1195]mm · 13 of 87 slices shown, 15 images]
[im 4/87  soft-tissue]
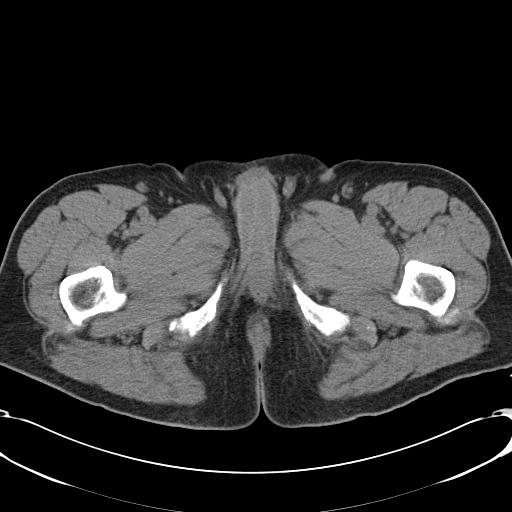
[im 4/87  bone]
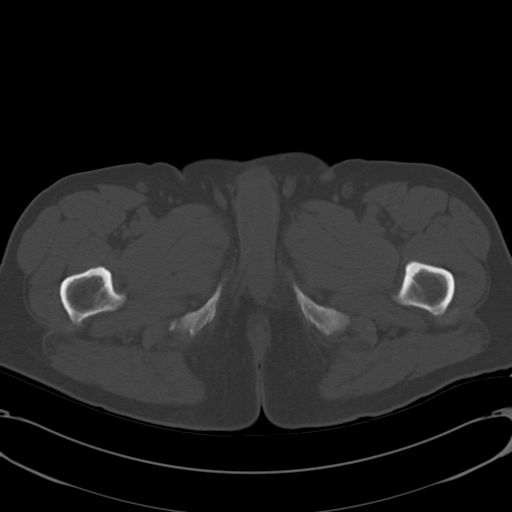
[im 12/87  soft-tissue]
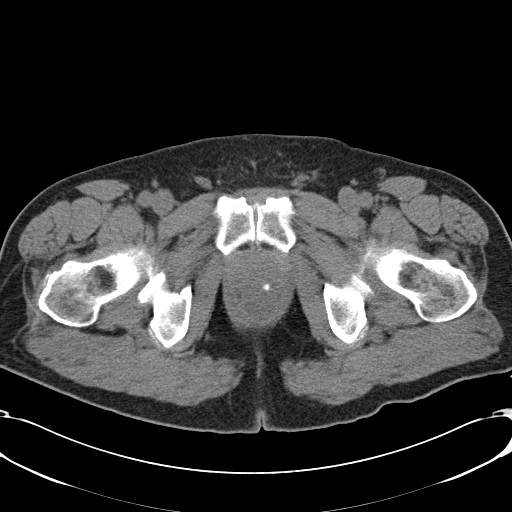
[im 19/87  soft-tissue]
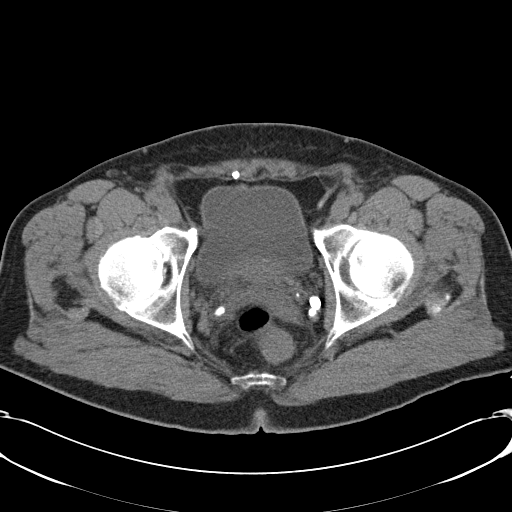
[im 23/87  soft-tissue]
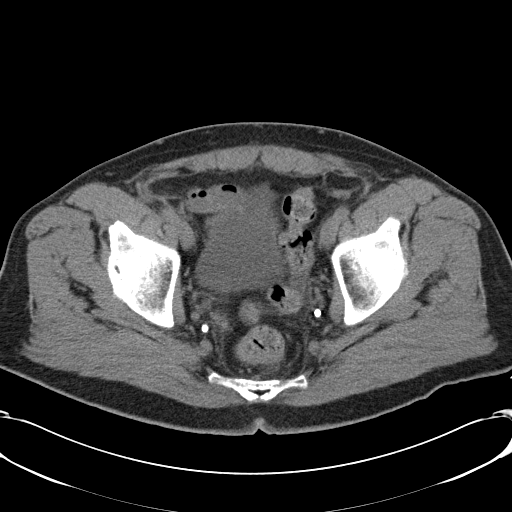
[im 30/87  soft-tissue]
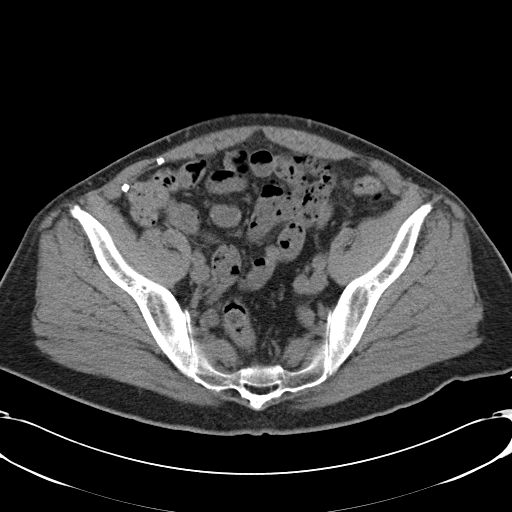
[im 38/87  soft-tissue]
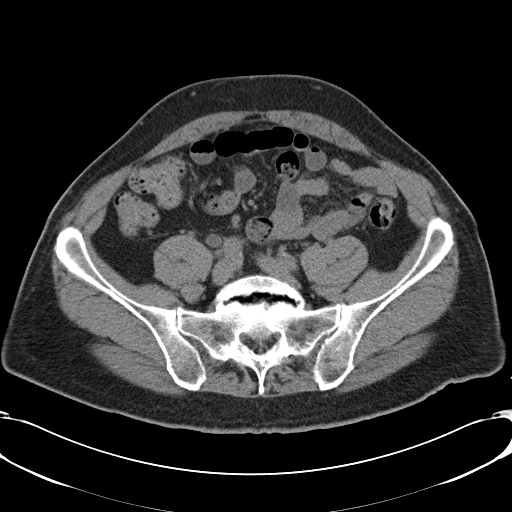
[im 45/87  soft-tissue]
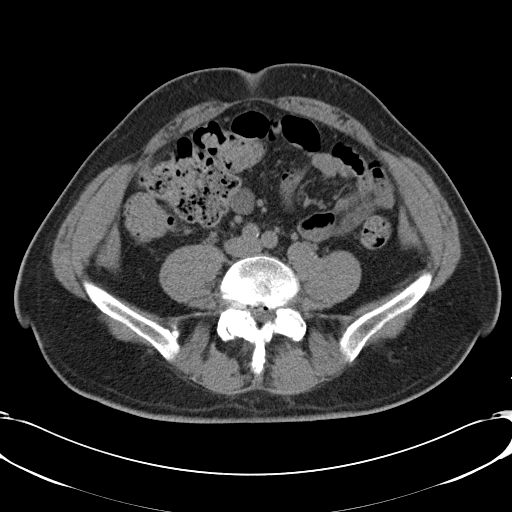
[im 49/87  soft-tissue]
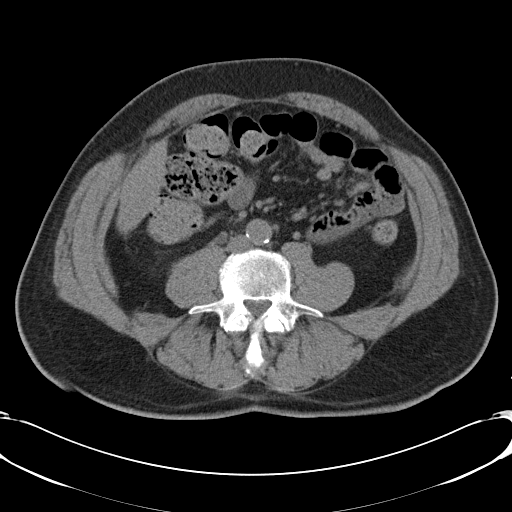
[im 57/87  soft-tissue]
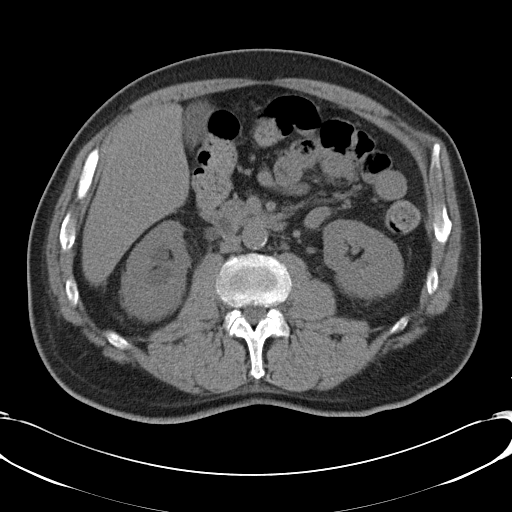
[im 57/87  bone]
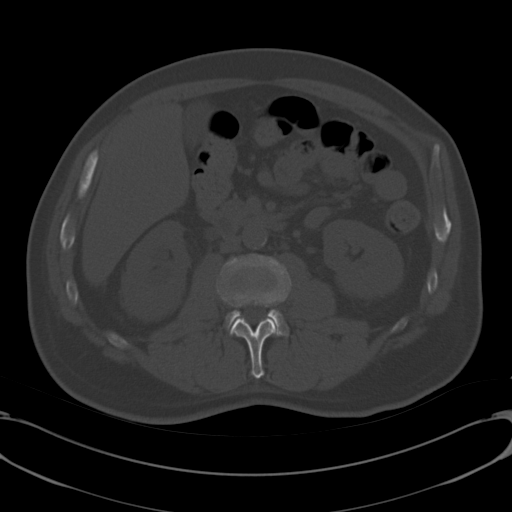
[im 64/87  soft-tissue]
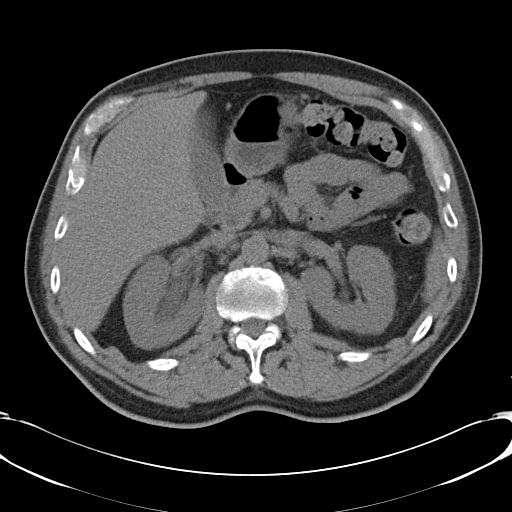
[im 68/87  soft-tissue]
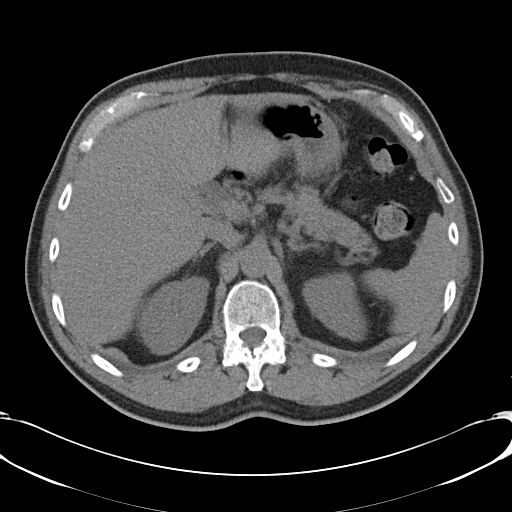
[im 75/87  soft-tissue]
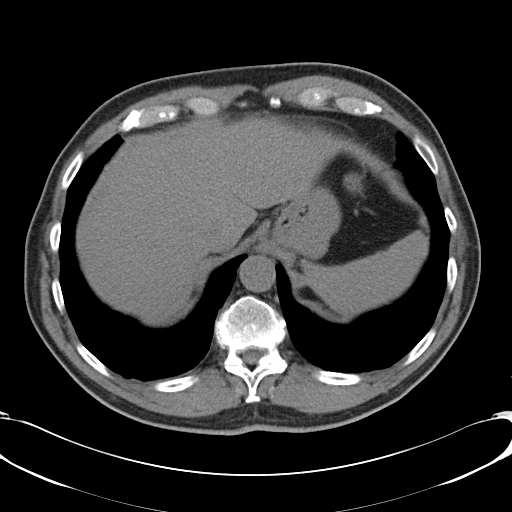
[im 83/87  soft-tissue]
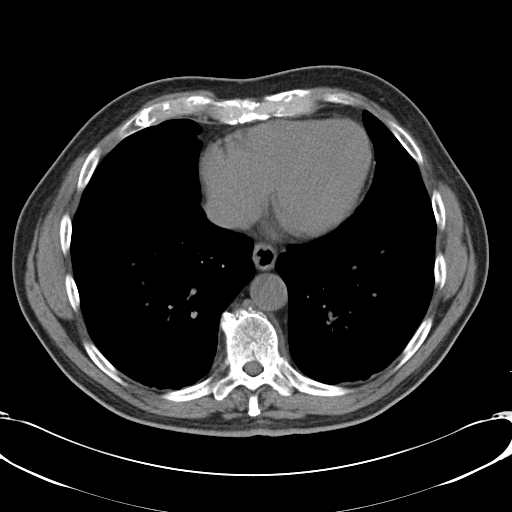

[Series 5: coronals cor · coronal · 0.88mm/px · 3 of 136 slices shown]
[im 46/136  soft-tissue]
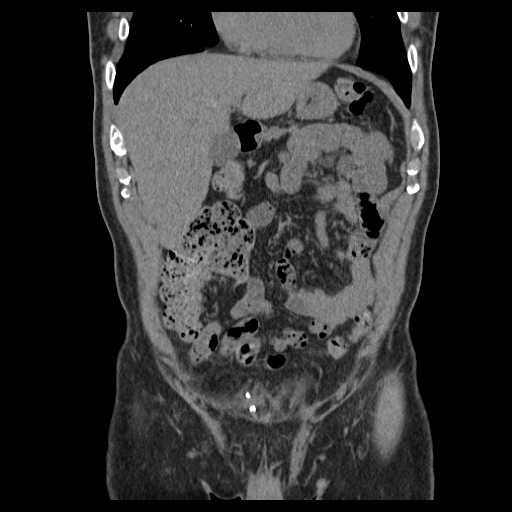
[im 61/136  soft-tissue]
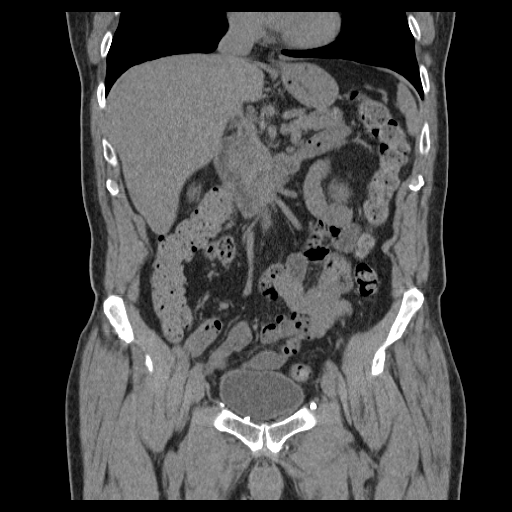
[im 76/136  soft-tissue]
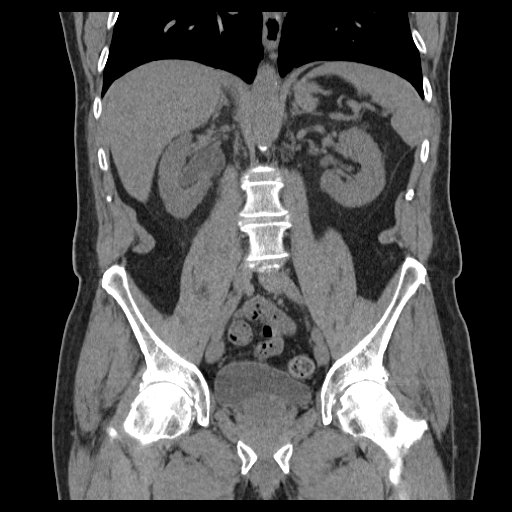

[16 of 46 positions shown; findings below may reference images not displayed]

FINDINGS: The lack of intravenous contrast limits the ability to evaluate
solid abdominal organs..

There is an approximately 4 mm stone within the right UVJ (image
67, series 2 which results in mild upstream ureterectasis and
pyelocaliectasis.  This finding is associated with a minimal amount
of asymmetric right-sided perinephric stranding.  No additional
stones are seen. No left-sided urinary obstruction.

Normal hepatic contour.  Normal noncontrast appearance of the
gallbladder.  No ascites.

Normal noncontrast appearance of the bilateral adrenal glands,
pancreas and spleen.

Colonic diverticulosis without evidence of diverticulitis on this
noncontrast examination.  The bowel is otherwise normal in course
and caliber without wall thickening or evidence of obstruction.
The appendix is not definitely identified, however there is no
inflammatory change within the right lower abdominal quadrant.
Postsurgical changes of the lower abdominal wall.  No
pneumoperitoneum, pneumatosis or portal venous gas.

Scattered atherosclerotic calcifications within a normal caliber
abdominal aorta.  No definite retroperitoneal, mesenteric, pelvic
or inguinal lymphadenopathy on this noncontrast examination.

Prostate remains enlarged.  Prostatic calcifications.  Multiple
phleboliths are seen in the pelvis.

Limited visualization of the lower thorax demonstrates minimal
dependent atelectasis.  No focal airspace opacities.  No pleural
effusion.  Normal heart size.  No pericardial effusion.

No acute or aggressive osseous abnormalities.  Degenerative change
of the lumbar spine, worst at L4 - L5 and L5 - S1 with unchanged
minimal (3 to 4 mm) of anterolisthesis of  L5 upon S1 with
associated bilateral facet degenerative change.
IMPRESSION: 1.  Approximately 4 mm stone within the right UVJ results in mild
upstream right-sided ureterectasis and pelvocaliectasis.  No
additional stones are seen.  No left-sided urinary obstruction.
2.  Colonic diverticulosis without evidence of diverticulitis.

3.  Lumbar spine degenerative change.

## 2013-08-05 ENCOUNTER — Encounter: Payer: Self-pay | Admitting: Pulmonary Disease

## 2013-08-05 ENCOUNTER — Ambulatory Visit (INDEPENDENT_AMBULATORY_CARE_PROVIDER_SITE_OTHER): Payer: Medicare Other | Admitting: Pulmonary Disease

## 2013-08-05 VITALS — BP 112/58 | HR 58 | Temp 98.0°F | Ht 67.0 in | Wt 156.8 lb

## 2013-08-05 DIAGNOSIS — G4733 Obstructive sleep apnea (adult) (pediatric): Secondary | ICD-10-CM

## 2013-08-05 NOTE — Patient Instructions (Addendum)
Try turning heater up on humidifier to see if you can get more moisture. Please call if this does not help your dryness problem followup with me in one year.

## 2013-08-05 NOTE — Assessment & Plan Note (Signed)
The patient has not been compliant with his CPAP most recently because of dryness issues.  He was not aware that he could adjust the heater settings on his CPAP machine, and I have instructed him on how to do this.  He is to let me know if he continues to have issues.

## 2013-08-05 NOTE — Progress Notes (Signed)
  Subjective:    Patient ID: Ronald Kelley, male    DOB: [birth year], 72 y.o.   MRN: [id number]  HPI Patient comes in today for followup of his obstructive sleep apnea.  He has not been wearing CPAP compliantly because of issues with nasal and mouth dryness.  However, he has not been testing the heater settings on his humidifier, and was not aware this could be done.  He is having no trouble with his pressure currently or his mask fit.   Review of Systems  Constitutional: Negative for fever and unexpected weight change.  HENT: Positive for trouble swallowing ( with CPAP). Negative for ear pain, nosebleeds, congestion, sore throat, rhinorrhea, sneezing, dental problem, postnasal drip and sinus pressure.   Eyes: Negative for redness and itching.  Respiratory: Negative for cough, chest tightness, shortness of breath and wheezing.   Cardiovascular: Negative for palpitations and leg swelling.  Gastrointestinal: Negative for nausea and vomiting.  Genitourinary: Negative for dysuria.  Musculoskeletal: Negative for joint swelling.  Skin: Negative for rash.  Neurological: Negative for headaches.  Hematological: Does not bruise/bleed easily.  Psychiatric/Behavioral: Negative for dysphoric mood. The patient is not nervous/anxious.        Objective:   Physical Exam Thin male in no acute distress Nose without ear discharge noted No skin breakdown or pressure necrosis from the CPAP mask Neck without lymphadenopathy or thyromegaly Lower extremities without edema, cyanosis Alert and oriented, moves all 4 extremities       Assessment & Plan:

## 2013-11-10 DIAGNOSIS — H25099 Other age-related incipient cataract, unspecified eye: Secondary | ICD-10-CM | POA: Diagnosis not present

## 2014-03-08 ENCOUNTER — Telehealth: Payer: Self-pay | Admitting: Pulmonary Disease

## 2014-03-08 ENCOUNTER — Telehealth: Payer: Self-pay | Admitting: Internal Medicine

## 2014-03-08 DIAGNOSIS — G4733 Obstructive sleep apnea (adult) (pediatric): Secondary | ICD-10-CM

## 2014-03-08 NOTE — Telephone Encounter (Signed)
Error wrong md.Stanley A Dalton

## 2014-03-08 NOTE — Telephone Encounter (Signed)
Pt states he has not been using his CPAP lately and has since found a mask (nasal pillows) that works better for him and needs to know what pressure he should be on-was told by [facility] that we have to send order for this.    I spoke with [facility] who is aware of above and states to just send order to patient on auto 5-20 and leave there; pt is aware and order sent to [facility] to send to [facility].

## 2014-04-05 DIAGNOSIS — L738 Other specified follicular disorders: Secondary | ICD-10-CM | POA: Diagnosis not present

## 2014-04-05 DIAGNOSIS — L570 Actinic keratosis: Secondary | ICD-10-CM | POA: Diagnosis not present

## 2014-04-05 DIAGNOSIS — L821 Other seborrheic keratosis: Secondary | ICD-10-CM | POA: Diagnosis not present

## 2014-04-05 DIAGNOSIS — L678 Other hair color and hair shaft abnormalities: Secondary | ICD-10-CM | POA: Diagnosis not present

## 2014-04-05 DIAGNOSIS — D235 Other benign neoplasm of skin of trunk: Secondary | ICD-10-CM | POA: Diagnosis not present

## 2014-04-25 DIAGNOSIS — N400 Enlarged prostate without lower urinary tract symptoms: Secondary | ICD-10-CM | POA: Diagnosis not present

## 2014-04-26 ENCOUNTER — Telehealth: Payer: Self-pay | Admitting: Pulmonary Disease

## 2014-04-26 DIAGNOSIS — G4733 Obstructive sleep apnea (adult) (pediatric): Secondary | ICD-10-CM

## 2014-04-26 DIAGNOSIS — M5137 Other intervertebral disc degeneration, lumbosacral region: Secondary | ICD-10-CM | POA: Diagnosis not present

## 2014-04-26 DIAGNOSIS — IMO0002 Reserved for concepts with insufficient information to code with codable children: Secondary | ICD-10-CM | POA: Diagnosis not present

## 2014-04-26 DIAGNOSIS — M999 Biomechanical lesion, unspecified: Secondary | ICD-10-CM | POA: Diagnosis not present

## 2014-04-26 NOTE — Telephone Encounter (Signed)
His machine is set on auto, and will give him the pressure he needs to treat his sleep apnea.  We can lower the pressure, but he will have some sleep apnea that is not treated. Decrease pressure to 10cm.  If pt cannot tolerate this, then cpap is not a viable therapy for him.

## 2014-04-26 NOTE — Telephone Encounter (Signed)
Pt returning call.Ronald Kelley ° °

## 2014-04-26 NOTE — Telephone Encounter (Signed)
LMTCB

## 2014-04-26 NOTE — Telephone Encounter (Signed)
Spoke with the pt  He states that he has not been able to use CPAP for the past 6 months  He reports that his pressure is too high and "blows me away" He states that he has tried several different masks and nothing helps  Has lost 10-12 lbs since last visit  Please advise, thanks!

## 2014-04-27 NOTE — Telephone Encounter (Signed)
Called and spoke with pt and he is aware of [facility] recs to decrease the cpap to 10cm.  Pt is aware that order has been sent to [facility].  Nothing further is needed.

## 2014-04-28 DIAGNOSIS — N529 Male erectile dysfunction, unspecified: Secondary | ICD-10-CM | POA: Diagnosis not present

## 2014-04-28 DIAGNOSIS — N400 Enlarged prostate without lower urinary tract symptoms: Secondary | ICD-10-CM | POA: Diagnosis not present

## 2014-04-28 DIAGNOSIS — R972 Elevated prostate specific antigen [PSA]: Secondary | ICD-10-CM | POA: Diagnosis not present

## 2014-05-12 DIAGNOSIS — N529 Male erectile dysfunction, unspecified: Secondary | ICD-10-CM | POA: Diagnosis not present

## 2014-06-14 DIAGNOSIS — M5137 Other intervertebral disc degeneration, lumbosacral region: Secondary | ICD-10-CM | POA: Diagnosis not present

## 2014-06-14 DIAGNOSIS — M999 Biomechanical lesion, unspecified: Secondary | ICD-10-CM | POA: Diagnosis not present

## 2014-06-14 DIAGNOSIS — IMO0002 Reserved for concepts with insufficient information to code with codable children: Secondary | ICD-10-CM | POA: Diagnosis not present

## 2014-07-28 ENCOUNTER — Encounter: Payer: Self-pay | Admitting: Internal Medicine

## 2014-07-28 ENCOUNTER — Encounter: Payer: Medicare Other | Admitting: Internal Medicine

## 2014-07-28 ENCOUNTER — Ambulatory Visit (INDEPENDENT_AMBULATORY_CARE_PROVIDER_SITE_OTHER): Payer: Medicare Other | Admitting: Internal Medicine

## 2014-07-28 VITALS — BP 124/68 | HR 50 | Temp 98.0°F | Wt 159.1 lb

## 2014-07-28 DIAGNOSIS — M8580 Other specified disorders of bone density and structure, unspecified site: Secondary | ICD-10-CM

## 2014-07-28 DIAGNOSIS — Z0000 Encounter for general adult medical examination without abnormal findings: Secondary | ICD-10-CM | POA: Diagnosis not present

## 2014-07-28 DIAGNOSIS — R972 Elevated prostate specific antigen [PSA]: Secondary | ICD-10-CM

## 2014-07-28 DIAGNOSIS — I7789 Other specified disorders of arteries and arterioles: Secondary | ICD-10-CM

## 2014-07-28 DIAGNOSIS — Z136 Encounter for screening for cardiovascular disorders: Secondary | ICD-10-CM

## 2014-07-28 DIAGNOSIS — E119 Type 2 diabetes mellitus without complications: Secondary | ICD-10-CM

## 2014-07-28 NOTE — Assessment & Plan Note (Addendum)
Td 2009 pneumonia shot 2009  prevnar-- offered  Palpable Ao-- check a u/s Got a Rx for shingles shot before x 2  Cscope 9-09, neg, next 10 years  diet and exercise discussed

## 2014-07-28 NOTE — Progress Notes (Signed)
Subjective:    Patient ID: Ronald Kelley, male    DOB: Oct 27, 1941, 73 y.o.   MRN: [id number]  DOS:  07/28/2014 Type of visit - description:   HPI 1.         Risk factors based on Past M, S, F history: reviewed   2.         Physical Activities: some yard work, occasionally golf ~ 1 week    3.         Depression/mood: (-) screening   4.         Hearing:  slt decreased? No worse , no tinnitus    5.         ADL's: independent   6.         Fall Risk: no recent falls, see instructions   7.         Home Safety:  does feel safe at home 8.         Height, weight, &visual acuity: sees VS, vision corrected w/ glasses, sees eye doctor 9.         Counseling: yes   10.       Labs ordered based on risk factors: yes   11.       Referral Coordination, if needed   12.       Care Plan, see a/p 13.       Cognitive Assessment: motor wnl, cognition appropiate , he is doing very well    in addition, we discussed the following Diabetes,Based on the last A1c metformin was prescribed, he decided not to take it,  is doing better with diet and exercise, has lost some weight, ambulatory CBGs fasting in the 120s, 140s Elevated PSA, recently saw urology H/o osteopenia, chart reviewed, due for a dexa   ROS Denies substernal chest pain, difficulty breathing or lower extremity edema No nausea, vomiting, diarrhea or blood in the stools No dysuria, gross hematuria or difficulty urinating. No lower extremity paresthesias except for some numbness of the left big toe after back surgery a few years ago    Past Medical History  Diagnosis Date  . Atrial fib/flutter, transient   . OSA on CPAP   . Degenerative disk disease   . Hyperthyroidism     remote h/o , no ablation  . Diabetes mellitus     a1c 6.5, 07/2008    Past Surgical History  Procedure Laterality Date  . Rotator cuff repair    . Hernia repair  2010  . L5 acute hnp      s/p surgery 09/21/08-- still has occasional paretheisa of the left foot      History   Social History  . Marital Status: Married    Spouse Name: N/A    Number of Children: 3  . Years of Education: N/A   Occupational History  . Insurance investment    Social History Main Topics  . Smoking status: Never Smoker   . Smokeless tobacco: Never Used  . Alcohol Use: No  . Drug Use: Not on file  . Sexual Activity: Not on file   Other Topics Concern  . Not on file   Social History Narrative   Married to [place]         Family History  Problem Relation Age of Onset  . Hypertension Mother   . Diabetes Brother   . Coronary artery disease Neg Hx   . Stroke Neg Hx   . Colon cancer Neg Hx   .  Prostate cancer Neg Hx   . Alzheimer's disease Father 26       Medication List    Notice As of 07/28/2014 11:59 PM   You have not been prescribed any medications.         Objective:   Physical Exam BP 124/68  Pulse 50  Temp(Src) 98 F (36.7 C) (Oral)  Wt 159 lb 2 oz (72.179 kg)  SpO2 98%  General -- alert, well-developed, NAD.  HEENT-- Not pale.  Lungs -- normal respiratory effort, no intercostal retractions, no accessory muscle use, and normal breath sounds.  Heart-- normal rate, regular rhythm, no murmur.  Abdomen-- Not distended, good bowel sounds,soft, non-tender. papable nontender aorta at the upper abdomen, no bruit Extremities-- no pretibial edema bilaterally  Neurologic--  alert & oriented X3. Speech normal, gait appropriate for age, strength symmetric and appropriate for age.  Psych-- Cognition and judgment appear intact. Cooperative with normal attention span and concentration. No anxious or depressed appearing.         Assessment & Plan:

## 2014-07-28 NOTE — Patient Instructions (Signed)
Please make an appointment to go  to our new building next week,  for labs: BMP, AST, ALT, A1c, TSH, FLP --- dx diabetes  If you like a pneumonia shot will called [facility]  please let us know and we'll provide to you.  Next visit to see me in 6 months   Fall Prevention and Home Safety Falls cause injuries and can affect all age groups. It is possible to use preventive measures to significantly decrease the likelihood of falls. There are many simple measures which can make your home safer and prevent falls. OUTDOORS  Repair cracks and edges of walkways and driveways.  Remove high doorway thresholds.  Trim shrubbery on the main path into your home.  Have good outside lighting.  Clear walkways of tools, rocks, debris, and clutter.  Check that handrails are not broken and are securely fastened. Both sides of steps should have handrails.  Have leaves, snow, and ice cleared regularly.  Use sand or salt on walkways during winter months.  In the garage, clean up grease or oil spills. BATHROOM  Install night lights.  Install grab bars by the toilet and in the tub and shower.  Use non-skid mats or decals in the tub or shower.  Place a plastic non-slip stool in the shower to sit on, if needed.  Keep floors dry and clean up all water on the floor immediately.  Remove soap buildup in the tub or shower on a regular basis.  Secure bath mats with non-slip, double-sided rug tape.  Remove throw rugs and tripping hazards from the floors. BEDROOMS  Install night lights.  Make sure a bedside light is easy to reach.  Do not use oversized bedding.  Keep a telephone by your bedside.  Have a firm chair with side arms to use for getting dressed.  Remove throw rugs and tripping hazards from the floor. KITCHEN  Keep handles on pots and pans turned toward the center of the stove. Use back burners when possible.  Clean up spills quickly and allow time for drying.  Avoid walking on  wet floors.  Avoid hot utensils and knives.  Position shelves so they are not too high or low.  Place commonly used objects within easy reach.  If necessary, use a sturdy step stool with a grab bar when reaching.  Keep electrical cables out of the way.  Do not use floor polish or wax that makes floors slippery. If you must use wax, use non-skid floor wax.  Remove throw rugs and tripping hazards from the floor. STAIRWAYS  Never leave objects on stairs.  Place handrails on both sides of stairways and use them. Fix any loose handrails. Make sure handrails on both sides of the stairways are as long as the stairs.  Check carpeting to make sure it is firmly attached along stairs. Make repairs to worn or loose carpet promptly.  Avoid placing throw rugs at the top or bottom of stairways, or properly secure the rug with carpet tape to prevent slippage. Get rid of throw rugs, if possible.  Have an electrician put in a light switch at the top and bottom of the stairs. OTHER FALL PREVENTION TIPS  Wear low-heel or rubber-soled shoes that are supportive and fit well. Wear closed toe shoes.  When using a stepladder, make sure it is fully opened and both spreaders are firmly locked. Do not climb a closed stepladder.  Add color or contrast paint or tape to grab bars and handrails in your home.  Place contrasting color strips on first and last steps.  Learn and use mobility aids as needed. Install an electrical emergency response system.  Turn on lights to avoid dark areas. Replace light bulbs that burn out immediately. Get light switches that glow.  Arrange furniture to create clear pathways. Keep furniture in the same place.  Firmly attach carpet with non-skid or double-sided tape.  Eliminate uneven floor surfaces.  Select a carpet pattern that does not visually hide the edge of steps.  Be aware of all pets. OTHER HOME SAFETY TIPS  Set the water temperature for 120 F (48.8  C).  Keep emergency numbers on or near the telephone.  Keep smoke detectors on every level of the home and near sleeping areas. Document Released: 11/07/2002 Document Revised: 05/18/2012 Document Reviewed: 02/06/2012 [facility] Patient Information 2015 [place], [place]. This information is not intended to replace advice given to you by your health care provider. Make sure you discuss any questions you have with your health care provider.

## 2014-07-28 NOTE — Assessment & Plan Note (Signed)
Last urology visit ~ 2 months ago

## 2014-07-28 NOTE — Assessment & Plan Note (Addendum)
Previous A1c is reviewed,I told the patient that cardiovascular risk factors increase once the A1c is more than 6.5. Was rec metformin but decided not to take Plan: A1c  further advice w/results, states he will remain skeptical about taking medications

## 2014-07-28 NOTE — Assessment & Plan Note (Signed)
Saw urology ~ 2 months ago, rx a local injection, decided not use it

## 2014-07-28 NOTE — Progress Notes (Signed)
Pre-visit discussion using our clinic review tool. No additional management support is needed unless otherwise documented below in the visit note.  

## 2014-07-30 DIAGNOSIS — M8580 Other specified disorders of bone density and structure, unspecified site: Secondary | ICD-10-CM | POA: Insufficient documentation

## 2014-07-30 NOTE — Assessment & Plan Note (Signed)
Dx w/ osteopenia in 2009, check a DEXA

## 2014-08-02 ENCOUNTER — Ambulatory Visit (HOSPITAL_BASED_OUTPATIENT_CLINIC_OR_DEPARTMENT_OTHER): Payer: Medicare Other

## 2014-08-03 ENCOUNTER — Other Ambulatory Visit (INDEPENDENT_AMBULATORY_CARE_PROVIDER_SITE_OTHER): Payer: Medicare Other

## 2014-08-03 ENCOUNTER — Other Ambulatory Visit: Payer: Medicare Other

## 2014-08-03 ENCOUNTER — Ambulatory Visit (HOSPITAL_BASED_OUTPATIENT_CLINIC_OR_DEPARTMENT_OTHER)
Admission: RE | Admit: 2014-08-03 | Discharge: 2014-08-03 | Disposition: A | Discharge status: Home / Self Care | Payer: Medicare Other | Source: Ambulatory Visit | Attending: Internal Medicine | Admitting: Internal Medicine

## 2014-08-03 DIAGNOSIS — Z136 Encounter for screening for cardiovascular disorders: Secondary | ICD-10-CM | POA: Diagnosis not present

## 2014-08-03 DIAGNOSIS — E119 Type 2 diabetes mellitus without complications: Secondary | ICD-10-CM

## 2014-08-03 LAB — LIPID PANEL
CHOL/HDL RATIO: 4
Cholesterol: 148 mg/dL (ref 0–200)
HDL: 36.7 mg/dL — ABNORMAL LOW (ref >39.00)
LDL Cholesterol: 92 mg/dL (ref 0–99)
NonHDL: 111.3
Triglycerides: 99 mg/dL (ref 0.0–149.0)
VLDL: 19.8 mg/dL (ref 0.0–40.0)

## 2014-08-03 LAB — AST: AST: 25 U/L (ref 0–37)

## 2014-08-03 LAB — TSH: TSH: 0.8 u[IU]/mL (ref 0.35–4.50)

## 2014-08-03 LAB — BASIC METABOLIC PANEL
BUN: 19 mg/dL (ref 6–23)
CALCIUM: 10.5 mg/dL (ref 8.4–10.5)
CO2: 29 mEq/L (ref 19–32)
CREATININE: 1 mg/dL (ref 0.4–1.5)
Chloride: 102 mEq/L (ref 96–112)
GFR: 75.77 mL/min (ref >60.00)
Glucose, Bld: 103 mg/dL — ABNORMAL HIGH (ref 70–99)
Potassium: 3.9 mEq/L (ref 3.5–5.1)
Sodium: 137 mEq/L (ref 135–145)

## 2014-08-03 LAB — ALT: ALT: 31 U/L (ref 0–53)

## 2014-08-03 LAB — HEMOGLOBIN A1C: Hgb A1c MFr Bld: 6.3 % (ref 4.6–6.5)

## 2014-08-03 NOTE — Addendum Note (Signed)
Addended by: Modena Morrow D on: 08/03/2014 10:22 AM   Modules accepted: Orders

## 2014-09-15 ENCOUNTER — Encounter: Payer: Self-pay | Admitting: Pulmonary Disease

## 2014-09-15 ENCOUNTER — Ambulatory Visit (INDEPENDENT_AMBULATORY_CARE_PROVIDER_SITE_OTHER): Payer: Medicare Other | Admitting: Pulmonary Disease

## 2014-09-15 ENCOUNTER — Encounter (INDEPENDENT_AMBULATORY_CARE_PROVIDER_SITE_OTHER): Payer: Self-pay

## 2014-09-15 VITALS — BP 112/60 | HR 75 | Temp 97.1°F | Ht 67.0 in | Wt 165.2 lb

## 2014-09-15 DIAGNOSIS — G4733 Obstructive sleep apnea (adult) (pediatric): Secondary | ICD-10-CM

## 2014-09-15 NOTE — Patient Instructions (Signed)
Continue on cpap, and will keep up with mask changes and supplies. followup with me in one year.

## 2014-09-15 NOTE — Assessment & Plan Note (Signed)
The patient is doing well with his current CPAP setup, and feels that it has helped his sleep and daytime alertness. I have asked him to continue with his device, and to keep up with his mask changes and supplies.

## 2014-09-15 NOTE — Progress Notes (Signed)
   Subjective:    Patient ID: Ronald Kelley, male    DOB: 1941/02/12, 73 y.o.   MRN: [id number]  HPI The patient comes in today for followup of his obstructive sleep apnea.  He is wearing CPAP compliantly, and although he does not like the mask, and he is wearing with improvement in his sleep and daytime alertness.   Review of Systems  Constitutional: Negative for fever and unexpected weight change.  HENT: Negative for congestion, dental problem, ear pain, nosebleeds, postnasal drip, rhinorrhea, sinus pressure, sneezing, sore throat and trouble swallowing.   Eyes: Negative for redness and itching.  Respiratory: Negative for cough, chest tightness, shortness of breath and wheezing.   Cardiovascular: Negative for palpitations and leg swelling.  Gastrointestinal: Negative for nausea and vomiting.  Genitourinary: Negative for dysuria.  Musculoskeletal: Negative for joint swelling.  Skin: Negative for rash.  Neurological: Negative for headaches.  Hematological: Does not bruise/bleed easily.  Psychiatric/Behavioral: Negative for dysphoric mood. The patient is not nervous/anxious.        Objective:   Physical Exam Well-developed male in no acute distress Nose without purulence or discharge noted No skin breakdown or pressure necrosis from the CPAP mask Neck without lymphadenopathy or thyromegaly Lower extremities without edema, no cyanosis Wert and oriented, moves all 4 extremities       Assessment & Plan:

## 2014-11-08 DIAGNOSIS — M9904 Segmental and somatic dysfunction of sacral region: Secondary | ICD-10-CM | POA: Diagnosis not present

## 2014-11-08 DIAGNOSIS — M5414 Radiculopathy, thoracic region: Secondary | ICD-10-CM | POA: Diagnosis not present

## 2014-11-08 DIAGNOSIS — M9903 Segmental and somatic dysfunction of lumbar region: Secondary | ICD-10-CM | POA: Diagnosis not present

## 2014-11-08 DIAGNOSIS — M5136 Other intervertebral disc degeneration, lumbar region: Secondary | ICD-10-CM | POA: Diagnosis not present

## 2014-12-26 DIAGNOSIS — D1801 Hemangioma of skin and subcutaneous tissue: Secondary | ICD-10-CM | POA: Diagnosis not present

## 2014-12-26 DIAGNOSIS — D225 Melanocytic nevi of trunk: Secondary | ICD-10-CM | POA: Diagnosis not present

## 2014-12-26 DIAGNOSIS — L814 Other melanin hyperpigmentation: Secondary | ICD-10-CM | POA: Diagnosis not present

## 2014-12-26 DIAGNOSIS — L820 Inflamed seborrheic keratosis: Secondary | ICD-10-CM | POA: Diagnosis not present

## 2014-12-26 DIAGNOSIS — L821 Other seborrheic keratosis: Secondary | ICD-10-CM | POA: Diagnosis not present

## 2015-01-12 DIAGNOSIS — E119 Type 2 diabetes mellitus without complications: Secondary | ICD-10-CM | POA: Diagnosis not present

## 2015-01-12 DIAGNOSIS — H25099 Other age-related incipient cataract, unspecified eye: Secondary | ICD-10-CM | POA: Diagnosis not present

## 2015-01-12 LAB — HM DIABETES EYE EXAM

## 2015-04-06 ENCOUNTER — Telehealth: Payer: Self-pay | Admitting: Pulmonary Disease

## 2015-04-09 DIAGNOSIS — M5414 Radiculopathy, thoracic region: Secondary | ICD-10-CM | POA: Diagnosis not present

## 2015-04-09 DIAGNOSIS — M9903 Segmental and somatic dysfunction of lumbar region: Secondary | ICD-10-CM | POA: Diagnosis not present

## 2015-04-09 DIAGNOSIS — M5136 Other intervertebral disc degeneration, lumbar region: Secondary | ICD-10-CM | POA: Diagnosis not present

## 2015-04-09 DIAGNOSIS — M9904 Segmental and somatic dysfunction of sacral region: Secondary | ICD-10-CM | POA: Diagnosis not present

## 2015-04-11 NOTE — Telephone Encounter (Signed)
Pt will need his followup with Dr. Halford Chessman when the time comes.

## 2015-04-12 NOTE — Telephone Encounter (Signed)
Called and left detailed message on voicemail advising patient that he will need to see Dr. Halford Chessman for his follow up appointment in October.  Requested that patient call back and schedule appointment with Dr. Halford Chessman.  Nothing further needed.

## 2015-05-16 DIAGNOSIS — L570 Actinic keratosis: Secondary | ICD-10-CM | POA: Diagnosis not present

## 2015-05-16 DIAGNOSIS — D1801 Hemangioma of skin and subcutaneous tissue: Secondary | ICD-10-CM | POA: Diagnosis not present

## 2015-05-16 DIAGNOSIS — L821 Other seborrheic keratosis: Secondary | ICD-10-CM | POA: Diagnosis not present

## 2015-05-16 DIAGNOSIS — L814 Other melanin hyperpigmentation: Secondary | ICD-10-CM | POA: Diagnosis not present

## 2015-05-16 DIAGNOSIS — D225 Melanocytic nevi of trunk: Secondary | ICD-10-CM | POA: Diagnosis not present

## 2015-07-19 ENCOUNTER — Telehealth: Payer: Self-pay | Admitting: Internal Medicine

## 2015-07-19 NOTE — Telephone Encounter (Signed)
pre visit letter mailed 07/10/15

## 2015-07-24 DIAGNOSIS — M5414 Radiculopathy, thoracic region: Secondary | ICD-10-CM | POA: Diagnosis not present

## 2015-07-24 DIAGNOSIS — M5136 Other intervertebral disc degeneration, lumbar region: Secondary | ICD-10-CM | POA: Diagnosis not present

## 2015-07-24 DIAGNOSIS — M9903 Segmental and somatic dysfunction of lumbar region: Secondary | ICD-10-CM | POA: Diagnosis not present

## 2015-07-24 DIAGNOSIS — M9904 Segmental and somatic dysfunction of sacral region: Secondary | ICD-10-CM | POA: Diagnosis not present

## 2015-07-31 ENCOUNTER — Encounter: Payer: Self-pay | Admitting: Internal Medicine

## 2015-07-31 ENCOUNTER — Ambulatory Visit (INDEPENDENT_AMBULATORY_CARE_PROVIDER_SITE_OTHER): Payer: Medicare Other | Admitting: Internal Medicine

## 2015-07-31 VITALS — BP 126/78 | HR 48 | Temp 97.5°F | Ht 67.0 in | Wt 158.4 lb

## 2015-07-31 DIAGNOSIS — G4733 Obstructive sleep apnea (adult) (pediatric): Secondary | ICD-10-CM | POA: Diagnosis not present

## 2015-07-31 DIAGNOSIS — E119 Type 2 diabetes mellitus without complications: Secondary | ICD-10-CM | POA: Diagnosis not present

## 2015-07-31 DIAGNOSIS — I480 Paroxysmal atrial fibrillation: Secondary | ICD-10-CM

## 2015-07-31 DIAGNOSIS — Z0000 Encounter for general adult medical examination without abnormal findings: Secondary | ICD-10-CM

## 2015-07-31 DIAGNOSIS — M8580 Other specified disorders of bone density and structure, unspecified site: Secondary | ICD-10-CM

## 2015-07-31 LAB — BASIC METABOLIC PANEL
BUN: 15 mg/dL (ref 6–23)
CO2: 31 mEq/L (ref 19–32)
Calcium: 10.6 mg/dL — ABNORMAL HIGH (ref 8.4–10.5)
Chloride: 104 mEq/L (ref 96–112)
Creatinine, Ser: 0.83 mg/dL (ref 0.40–1.50)
GFR: 95.86 mL/min (ref >60.00)
Glucose, Bld: 118 mg/dL — ABNORMAL HIGH (ref 70–99)
POTASSIUM: 3.9 meq/L (ref 3.5–5.1)
SODIUM: 138 meq/L (ref 135–145)

## 2015-07-31 LAB — CBC WITH DIFFERENTIAL/PLATELET
BASOS ABS: 0 10*3/uL (ref 0.0–0.1)
BASOS PCT: 0.4 % (ref 0.0–3.0)
Eosinophils Absolute: 0.1 10*3/uL (ref 0.0–0.7)
Eosinophils Relative: 2 % (ref 0.0–5.0)
HEMATOCRIT: 44.8 % (ref 39.0–52.0)
Hemoglobin: 14.9 g/dL (ref 13.0–17.0)
LYMPHS PCT: 39 % (ref 12.0–46.0)
Lymphs Abs: 2.7 10*3/uL (ref 0.7–4.0)
MCHC: 33.3 g/dL (ref 30.0–36.0)
MCV: 91.6 fl (ref 78.0–100.0)
MONOS PCT: 8 % (ref 3.0–12.0)
Monocytes Absolute: 0.6 10*3/uL (ref 0.1–1.0)
NEUTROS ABS: 3.5 10*3/uL (ref 1.4–7.7)
Neutrophils Relative %: 50.6 % (ref 43.0–77.0)
PLATELETS: 151 10*3/uL (ref 150.0–400.0)
RBC: 4.9 Mil/uL (ref 4.22–5.81)
RDW: 13.7 % (ref 11.5–15.5)
WBC: 7 10*3/uL (ref 4.0–10.5)

## 2015-07-31 LAB — MICROALBUMIN / CREATININE URINE RATIO
CREATININE, U: 129.3 mg/dL
Microalb Creat Ratio: 0.5 mg/g (ref 0.0–30.0)
Microalb, Ur: <0.7 mg/dL (ref 0.0–1.9)

## 2015-07-31 LAB — ALT: ALT: 22 U/L (ref 0–53)

## 2015-07-31 LAB — HM DIABETES FOOT EXAM: HM Diabetic Foot Exam: NORMAL

## 2015-07-31 LAB — AST: AST: 22 U/L (ref 0–37)

## 2015-07-31 LAB — LIPID PANEL
CHOLESTEROL: 139 mg/dL (ref 0–200)
HDL: 37.8 mg/dL — ABNORMAL LOW (ref >39.00)
LDL Cholesterol: 85 mg/dL (ref 0–99)
NonHDL: 100.71
TRIGLYCERIDES: 77 mg/dL (ref 0.0–149.0)
Total CHOL/HDL Ratio: 4
VLDL: 15.4 mg/dL (ref 0.0–40.0)

## 2015-07-31 LAB — TSH: TSH: 1.74 u[IU]/mL (ref 0.35–4.50)

## 2015-07-31 LAB — HEMOGLOBIN A1C: Hgb A1c MFr Bld: 6.3 % (ref 4.6–6.5)

## 2015-07-31 NOTE — Progress Notes (Signed)
Subjective:    Patient ID: Ronald Kelley, male    DOB: 09-15-1941, 74 y.o.   MRN: [id number]  DOS:  07/31/2015 Type of visit - description :  Here for Medicare AWV:  1. Risk factors based on Past M, S, F history: reviewed 2. Physical Activities:  Walks frequently, ~ 4/week 3. Depression/mood: neg screening  4. Hearing:  Moderate problem, considering a hearing aid, to call if referral needed  5. ADL's: independent, drives  6. Fall Risk: had a fall a month ago, accidental, prevention discussed , see AVS 7. home Safety: does feel safe at home  8. Height, weight, & visual acuity: see VS, sees eye doctor regulalrly 9. Counseling: provided 10. Labs ordered based on risk factors: if needed  11. Referral Coordination: if needed 12. Care Plan, see assessment and plan , written personalized plan provided , see AVS 13. Cognitive Assessment: motor skills and cognition appropriate for age 56. Care team updated   15. End-of-life care discussed, rec a HC-POA, see AVS  In addition, today we discussed the following: Sleep apnea, on CPAP, does not use it qhs,  last visit with pulmonary 08-2014. Increased PSA, ED: Urology note from 05-2014 reviewed Dm-- was rx metformin before but never did use it, trying to eat better-increase exercise     Review of Systems Constitutional: No fever. No chills. No unexplained wt changes. No unusual sweats  HEENT: No dental problems, no ear discharge, no facial swelling, no voice changes. No eye discharge, no eye  redness , no  intolerance to light   Respiratory: No wheezing , no  difficulty breathing. No cough , no mucus production  Cardiovascular: No CP, no leg swelling , no  Palpitations  GI: no nausea, no vomiting, no diarrhea , no  abdominal pain.  No blood in the stools. No dysphagia, no odynophagia    Endocrine: No polyphagia, no polyuria , no polydipsia  GU: No dysuria, gross hematuria, difficulty urinating. No urinary urgency, no frequency.  Occasional nocturia  Musculoskeletal: No joint swellings or unusual aches or pains  Skin: No change in the color of the skin, palor , no  Rash  Allergic, immunologic: + Seasonal allergies  Neurological: No dizziness no  syncope. No headaches. No diplopia, no slurred, no slurred speech, no motor deficits, no facial  Numbness  Hematological: No enlarged lymph nodes, no easy bruising , no unusual bleedings  Psychiatry: No suicidal ideas, no hallucinations, no beavior problems, no confusion.  No unusual/severe anxiety, no depression   Past Medical History  Diagnosis Date  . Atrial fib/flutter, transient   . OSA on CPAP   . Degenerative disk disease   . Hyperthyroidism     remote h/o , no ablation  . Diabetes mellitus     a1c 6.5, 07/2008    Past Surgical History  Procedure Laterality Date  . Rotator cuff repair    . Hernia repair  2010  . L5 acute hnp      s/p surgery 09/21/08-- still has occasional paretheisa of the left foot     Social History   Social History  . Marital Status: Married    Spouse Name: N/A  . Number of Children: 3  . Years of Education: N/A   Occupational History  . Insurance investment-- semi-retirement    Social History Main Topics  . Smoking status: Never Smoker   . Smokeless tobacco: Never Used  . Alcohol Use: No  . Drug Use: Not on file  . Sexual  Activity: Not on file   Other Topics Concern  . Not on file   Social History Narrative   Married to [place]         Family History  Problem Relation Age of Onset  . Hypertension Mother   . Diabetes Brother   . Coronary artery disease Neg Hx   . Stroke Neg Hx   . Colon cancer Neg Hx   . Prostate cancer Neg Hx   . Alzheimer's disease Father 32       Medication List       This list is accurate as of: 07/31/15  9:04 PM.  Always use your most recent med list.               Chromium 500 MCG Tabs  Take 1 tablet by mouth daily.     CINNAMON PO  Take 1 tablet by mouth 2 (two) times  daily.           Objective:   Physical Exam BP 126/78 mmHg  Pulse 48  Temp(Src) 97.5 F (36.4 C) (Oral)  Ht 5\' 7"  (1.702 m)  Wt 158 lb 6 oz (71.838 kg)  BMI 24.80 kg/m2  SpO2 98% General:   Well developed, well nourished . NAD.  Neck:  Full range of motion. Supple. No  thyromegaly , normal carotid pulse HEENT:  Normocephalic . Face symmetric, atraumatic Lungs:  CTA B Normal respiratory effort, no intercostal retractions, no accessory muscle use. Heart: RRR,  no murmur.  No pretibial edema bilaterally  Abdomen:  Not distended, soft, non-tender. No rebound or rigidity. Palpable, nontender aorta in the upper abdomen, no bruit. Diabetes foot exam: Good pedal pulses, normal pinprick examination, skin dry, toenails thick Skin: Exposed areas without rash. Not pale. Not jaundice Neurologic:  alert & oriented X3.  Speech normal, gait appropriate for age and unassisted Strength symmetric and appropriate for age.  Psych: Cognition and judgment appear intact.  Cooperative with normal attention span and concentration.  Behavior appropriate. No anxious or depressed appearing.    Assessment & Plan:

## 2015-07-31 NOTE — Patient Instructions (Signed)
Get your blood work before you leave    Please consider visit these websites for more information:  www.begintheconversation.org  theconversationproject.org    Fall Prevention and Home Safety Falls cause injuries and can affect all age groups. It is possible to use preventive measures to significantly decrease the likelihood of falls. There are many simple measures which can make your home safer and prevent falls. OUTDOORS  Repair cracks and edges of walkways and driveways.  Remove high doorway thresholds.  Trim shrubbery on the main path into your home.  Have good outside lighting.  Clear walkways of tools, rocks, debris, and clutter.  Check that handrails are not broken and are securely fastened. Both sides of steps should have handrails.  Have leaves, snow, and ice cleared regularly.  Use sand or salt on walkways during winter months.  In the garage, clean up grease or oil spills. BATHROOM  Install night lights.  Install grab bars by the toilet and in the tub and shower.  Use non-skid mats or decals in the tub or shower.  Place a plastic non-slip stool in the shower to sit on, if needed.  Keep floors dry and clean up all water on the floor immediately.  Remove soap buildup in the tub or shower on a regular basis.  Secure bath mats with non-slip, double-sided rug tape.  Remove throw rugs and tripping hazards from the floors. BEDROOMS  Install night lights.  Make sure a bedside light is easy to reach.  Do not use oversized bedding.  Keep a telephone by your bedside.  Have a firm chair with side arms to use for getting dressed.  Remove throw rugs and tripping hazards from the floor. KITCHEN  Keep handles on pots and pans turned toward the center of the stove. Use back burners when possible.  Clean up spills quickly and allow time for drying.  Avoid walking on wet floors.  Avoid hot utensils and knives.  Position shelves so they are not too high  or low.  Place commonly used objects within easy reach.  If necessary, use a sturdy step stool with a grab bar when reaching.  Keep electrical cables out of the way.  Do not use floor polish or wax that makes floors slippery. If you must use wax, use non-skid floor wax.  Remove throw rugs and tripping hazards from the floor. STAIRWAYS  Never leave objects on stairs.  Place handrails on both sides of stairways and use them. Fix any loose handrails. Make sure handrails on both sides of the stairways are as long as the stairs.  Check carpeting to make sure it is firmly attached along stairs. Make repairs to worn or loose carpet promptly.  Avoid placing throw rugs at the top or bottom of stairways, or properly secure the rug with carpet tape to prevent slippage. Get rid of throw rugs, if possible.  Have an electrician put in a light switch at the top and bottom of the stairs. OTHER FALL PREVENTION TIPS  Wear low-heel or rubber-soled shoes that are supportive and fit well. Wear closed toe shoes.  When using a stepladder, make sure it is fully opened and both spreaders are firmly locked. Do not climb a closed stepladder.  Add color or contrast paint or tape to grab bars and handrails in your home. Place contrasting color strips on first and last steps.  Learn and use mobility aids as needed. Install an electrical emergency response system.  Turn on lights to avoid dark areas.   Replace light bulbs that burn out immediately. Get light switches that glow.  Arrange furniture to create clear pathways. Keep furniture in the same place.  Firmly attach carpet with non-skid or double-sided tape.  Eliminate uneven floor surfaces.  Select a carpet pattern that does not visually hide the edge of steps.  Be aware of all pets. OTHER HOME SAFETY TIPS  Set the water temperature for 120 F (48.8 C).  Keep emergency numbers on or near the telephone.  Keep smoke detectors on every level of  the home and near sleeping areas. Document Released: 11/07/2002 Document Revised: 05/18/2012 Document Reviewed: 02/06/2012 ExitCare Patient Information 2015 [employer]. This information is not intended to replace advice given to you by your health care provider. Make sure you discuss any questions you have with your health care provider.   Preventive Care for Adults Ages 65 and over  Blood pressure check.** / Every 1 to 2 years.  Lipid and cholesterol check.**/ Every 5 years beginning at age 20.  Lung cancer screening. / Every year if you are aged 55-80 years and have a 30-pack-year history of smoking and currently smoke or have quit within the past 15 years. Yearly screening is stopped once you have quit smoking for at least 15 years or develop a health problem that would prevent you from having lung cancer treatment.  Fecal occult blood test (FOBT) of stool. / Every year beginning at age 50 and continuing until age 75. You may not have to do this test if you get a colonoscopy every 10 years.  Flexible sigmoidoscopy** or colonoscopy.** / Every 5 years for a flexible sigmoidoscopy or every 10 years for a colonoscopy beginning at age 50 and continuing until age 75.  Hepatitis C blood test.** / For all people born from 1945 through 1965 and any individual with known risks for hepatitis C.  Abdominal aortic aneurysm (AAA) screening.** / A one-time screening for ages 65 to 75 years who are current or former smokers.  Skin self-exam. / Monthly.  Influenza vaccine. / Every year.  Tetanus, diphtheria, and acellular pertussis (Tdap/Td) vaccine.** / 1 dose of Td every 10 years.  Varicella vaccine.** / Consult your health care provider.  Zoster vaccine.** / 1 dose for adults aged 60 years or older.  Pneumococcal 13-valent conjugate (PCV13) vaccine.** / Consult your health care provider.  Pneumococcal polysaccharide (PPSV23) vaccine.** / 1 dose for all adults aged 65 years and  older.  Meningococcal vaccine.** / Consult your health care provider.  Hepatitis A vaccine.** / Consult your health care provider.  Hepatitis B vaccine.** / Consult your health care provider.  Haemophilus influenzae type b (Hib) vaccine.** / Consult your health care provider. **Family history and personal history of risk and conditions may change your health care provider's recommendations. Document Released: 01/13/2002 Document Revised: 11/22/2013 Document Reviewed: 04/14/2011 ExitCare Patient Information 2015 [employer]. This information is not intended to replace advice given to you by your health care provider. Make sure you discuss any questions you have with your health care provider.   

## 2015-07-31 NOTE — Assessment & Plan Note (Signed)
Feet exam negative today, has chronic left great toe tingling since back surgery. No decrease feeling on his feet. Check A1c He decided will try to avoid taking any medication if possible, doing well with diet and exercise

## 2015-07-31 NOTE — Assessment & Plan Note (Signed)
DEXA ordered last year, not done

## 2015-07-31 NOTE — Assessment & Plan Note (Signed)
Td 2009; pneumonia shot 2009 ; prevnar-- offered, declined ;zostavax-- declined  Flu shot -- declined   Palpable Ao-- normal  U/s 08-2014 prostae cancer screening: per urology Cscope 9-09, neg, next 10 years  diet and exercise discussed, he is actually doing very well

## 2015-07-31 NOTE — Progress Notes (Signed)
Pre visit review using our clinic review tool, if applicable. No additional management support is needed unless otherwise documented below in the visit note. 

## 2015-07-31 NOTE — Assessment & Plan Note (Signed)
Uses a CPAP most days

## 2015-07-31 NOTE — Assessment & Plan Note (Addendum)
History of remote atrial fibrillation, episodes have always been linked to postsurgical state. Patient believes afib was induced by pain medication. Currently asymptomatic, check a CBC and TSH

## 2015-09-14 DIAGNOSIS — M9904 Segmental and somatic dysfunction of sacral region: Secondary | ICD-10-CM | POA: Diagnosis not present

## 2015-09-14 DIAGNOSIS — M9903 Segmental and somatic dysfunction of lumbar region: Secondary | ICD-10-CM | POA: Diagnosis not present

## 2015-09-14 DIAGNOSIS — M5414 Radiculopathy, thoracic region: Secondary | ICD-10-CM | POA: Diagnosis not present

## 2015-09-14 DIAGNOSIS — M5136 Other intervertebral disc degeneration, lumbar region: Secondary | ICD-10-CM | POA: Diagnosis not present

## 2016-01-11 ENCOUNTER — Ambulatory Visit (INDEPENDENT_AMBULATORY_CARE_PROVIDER_SITE_OTHER): Payer: Medicare Other | Admitting: Internal Medicine

## 2016-01-11 ENCOUNTER — Encounter: Payer: Self-pay | Admitting: Internal Medicine

## 2016-01-11 VITALS — BP 122/74 | HR 54 | Temp 98.4°F | Ht 67.0 in | Wt 163.2 lb

## 2016-01-11 DIAGNOSIS — M25512 Pain in left shoulder: Secondary | ICD-10-CM

## 2016-01-11 MED ORDER — PREDNISONE 10 MG PO TABS: ORAL_TABLET | ORAL | Status: DC

## 2016-01-11 MED ORDER — CYCLOBENZAPRINE HCL 10 MG PO TABS: 10.0000 mg | ORAL_TABLET | Freq: Every evening | ORAL | Status: DC | PRN

## 2016-01-11 NOTE — Progress Notes (Signed)
Pre visit review using our clinic review tool, if applicable. No additional management support is needed unless otherwise documented below in the visit note. 

## 2016-01-11 NOTE — Progress Notes (Signed)
Subjective:    Patient ID: Ronald Kelley, male    DOB: 1941-02-15, 75 y.o.   MRN: [id number]  DOS:  01/11/2016 Type of visit - description : acute Interval history: Sx started ~ 2 months ago, L shoulder pain worse at night, decrease w/ ibuprofen , takes it most nights w/o apparent side effects. Pain sometimes radiates medially to the upper trapezoid area. He does not use his left arm much during the daytime, can't tell if pain increases with arm motion. He is right-handed. Has some mild chronic pain at the right shoulder, since had surgery there few years ago.     Review of Systems Denies any neck pain per se  No overuse of the shoulder, he did have a fall 3 weeks ago at home, was an accidental fall, no syncope, landed on his knee. No upper or lower extremity paresthesias. No difficulty coordinating his legs  Past Medical History  Diagnosis Date  . Atrial fib/flutter, transient   . OSA on CPAP   . Degenerative disk disease   . Hyperthyroidism     remote h/o , no ablation  . Diabetes mellitus     a1c 6.5, 07/2008    Past Surgical History  Procedure Laterality Date  . Rotator cuff repair    . Hernia repair  2010  . L5 acute hnp      s/p surgery 09/21/08-- still has occasional paretheisa of the left foot     Social History   Social History  . Marital Status: Married    Spouse Name: N/A  . Number of Children: 3  . Years of Education: N/A   Occupational History  . Insurance investment-- semi-retirement    Social History Main Topics  . Smoking status: Never Smoker   . Smokeless tobacco: Never Used  . Alcohol Use: No  . Drug Use: Not on file  . Sexual Activity: Not on file   Other Topics Concern  . Not on file   Social History Narrative   Married to [place]            Medication List       This list is accurate as of: 01/11/16 11:59 PM.  Always use your most recent med list.               Chromium 500 MCG Tabs  Take 1 tablet by mouth daily.     CINNAMON PO  Take 1 tablet by mouth 2 (two) times daily.     cyclobenzaprine 10 MG tablet  Commonly known as:  FLEXERIL  Take 1 tablet (10 mg total) by mouth at bedtime as needed for muscle spasms.     predniSONE 10 MG tablet  Commonly known as:  DELTASONE  4 tablets x 2 days, 3 tabs x 2 days, 2 tabs x 2 days, 1 tab x 2 days           Objective:   Physical Exam BP 122/74 mmHg  Pulse 54  Temp(Src) 98.4 F (36.9 C) (Oral)  Ht 5\' 7"  (1.702 m)  Wt 163 lb 4 oz (74.05 kg)  BMI 25.56 kg/m2  SpO2 97% General:   Well developed, well nourished . NAD.  HEENT:  Normocephalic . Face symmetric, atraumatic. Neck: No TTP MSK: Shoulders symmetric, range of motion slightly limited bilaterally, mild pain with passive rotation of the left shoulder. No deformities. Skin: Not pale. Not jaundice Neurologic:  alert & oriented X3.  Speech normal, gait appropriate for age and unassisted.  DTRs and strength symmetric. Good coordination. Psych--  Cognition and judgment appear intact.  Cooperative with normal attention span and concentration.  Behavior appropriate. No anxious or depressed appearing.      Assessment & Plan:    Assessment DM -- A1C 6.5 (2009) Afib/flutter, transient (remote, no anticoag) OSA, on CPAP DJD Hyperthyroidism, remote, no ablation  PLAN 01-11-16 Left shoulder pain: Suspect primary shoulder issue, less likely radiculopathy (he does have some pain at the upper trapezoid area). Recommend treatment with prednisone, Flexeril, Tylenol. Minimize the use of ibuprofen, discussed GI and renal implications. Call if not better in few weeks, so referral? Watch for elevated CBGs.

## 2016-01-11 NOTE — Patient Instructions (Signed)
Take prednisone as prescribed   Flexeril at bedtime (muscle relaxant). watch for drowsiness   Tylenol  500 mg OTC 2 tabs a day every 8 hours as needed for pain   Try to minimize ibuprofen to once 2 or 3 times a week    Check your sugars, stop prednisone if more than 180   Call if no better in 2-3 weeks

## 2016-01-17 ENCOUNTER — Telehealth: Payer: Self-pay | Admitting: Internal Medicine

## 2016-01-17 NOTE — Telephone Encounter (Signed)
Recommend to take 2 tablets a day for 4 days and then stop.

## 2016-01-17 NOTE — Telephone Encounter (Signed)
Pt called and stated he took prednisone for 2 days (4 prednisone per day). His blood sugar was up to 198 on the second day. He just wanted to let Dr. Larose Kells know he stopped it. Blood sugar went back down to 116 the next day.

## 2016-01-17 NOTE — Telephone Encounter (Signed)
LMOM informing Pt, Dr. Larose Kells recommendations, informed him to continue monitoring blood sugars and instructed him to call if he had any questions or concerns.

## 2016-01-17 NOTE — Telephone Encounter (Signed)
FYI

## 2016-02-01 ENCOUNTER — Ambulatory Visit: Payer: BLUE CROSS/BLUE SHIELD | Admitting: Pulmonary Disease

## 2016-04-17 DIAGNOSIS — L570 Actinic keratosis: Secondary | ICD-10-CM | POA: Diagnosis not present

## 2016-04-17 DIAGNOSIS — L821 Other seborrheic keratosis: Secondary | ICD-10-CM | POA: Diagnosis not present

## 2016-04-17 DIAGNOSIS — L820 Inflamed seborrheic keratosis: Secondary | ICD-10-CM | POA: Diagnosis not present

## 2016-05-08 DIAGNOSIS — M5136 Other intervertebral disc degeneration, lumbar region: Secondary | ICD-10-CM | POA: Diagnosis not present

## 2016-05-08 DIAGNOSIS — M9904 Segmental and somatic dysfunction of sacral region: Secondary | ICD-10-CM | POA: Diagnosis not present

## 2016-05-08 DIAGNOSIS — M5414 Radiculopathy, thoracic region: Secondary | ICD-10-CM | POA: Diagnosis not present

## 2016-05-08 DIAGNOSIS — M9903 Segmental and somatic dysfunction of lumbar region: Secondary | ICD-10-CM | POA: Diagnosis not present

## 2016-06-13 DIAGNOSIS — E119 Type 2 diabetes mellitus without complications: Secondary | ICD-10-CM | POA: Diagnosis not present

## 2016-07-03 DIAGNOSIS — M5136 Other intervertebral disc degeneration, lumbar region: Secondary | ICD-10-CM | POA: Diagnosis not present

## 2016-07-03 DIAGNOSIS — M9903 Segmental and somatic dysfunction of lumbar region: Secondary | ICD-10-CM | POA: Diagnosis not present

## 2016-07-03 DIAGNOSIS — M5414 Radiculopathy, thoracic region: Secondary | ICD-10-CM | POA: Diagnosis not present

## 2016-07-03 DIAGNOSIS — M9904 Segmental and somatic dysfunction of sacral region: Secondary | ICD-10-CM | POA: Diagnosis not present

## 2016-09-24 ENCOUNTER — Encounter: Payer: Self-pay | Admitting: Family Medicine

## 2016-09-24 ENCOUNTER — Ambulatory Visit (INDEPENDENT_AMBULATORY_CARE_PROVIDER_SITE_OTHER): Payer: Medicare Other | Admitting: Family Medicine

## 2016-09-24 VITALS — BP 145/79 | HR 65 | Resp 16 | Wt 156.0 lb

## 2016-09-24 DIAGNOSIS — I480 Paroxysmal atrial fibrillation: Secondary | ICD-10-CM | POA: Diagnosis not present

## 2016-09-24 DIAGNOSIS — R7301 Impaired fasting glucose: Secondary | ICD-10-CM

## 2016-09-24 DIAGNOSIS — E119 Type 2 diabetes mellitus without complications: Secondary | ICD-10-CM

## 2016-09-24 DIAGNOSIS — G4733 Obstructive sleep apnea (adult) (pediatric): Secondary | ICD-10-CM

## 2016-09-24 DIAGNOSIS — N401 Enlarged prostate with lower urinary tract symptoms: Secondary | ICD-10-CM

## 2016-09-24 DIAGNOSIS — E0590 Thyrotoxicosis, unspecified without thyrotoxic crisis or storm: Secondary | ICD-10-CM

## 2016-09-24 DIAGNOSIS — N529 Male erectile dysfunction, unspecified: Secondary | ICD-10-CM

## 2016-09-24 DIAGNOSIS — R351 Nocturia: Secondary | ICD-10-CM

## 2016-09-24 DIAGNOSIS — H9110 Presbycusis, unspecified ear: Secondary | ICD-10-CM

## 2016-09-24 DIAGNOSIS — R972 Elevated prostate specific antigen [PSA]: Secondary | ICD-10-CM

## 2016-09-24 LAB — POCT GLYCOSYLATED HEMOGLOBIN (HGB A1C): Hemoglobin A1C: 6.1

## 2016-09-24 NOTE — Assessment & Plan Note (Addendum)
Suppose to use mask but doesn't.  Did see Dr. Gwenette Greet of pulmonology\ sleep medicine in the past

## 2016-09-24 NOTE — Assessment & Plan Note (Addendum)
Patient only experienced symptoms after he was on percocet 4 days;  Also occurs after too much coffee/ caffeine

## 2016-09-24 NOTE — Assessment & Plan Note (Addendum)
Occ takes TUMS only

## 2016-09-24 NOTE — Patient Instructions (Signed)
Please realize, EXERCISE IS MEDICINE!  -  American Heart Association [facility]) guidelines for exercise : If you are in good health, without any medical conditions, you should engage in 150 minutes of moderate intensity aerobic activity per week.  This means you should be huffing and puffing throughout your workout.   Engaging in regular exercise will improve brain function and memory, as well as improve mood, boost immune system and help with weight management.  As well as the other, more well-known effects of exercise such as decreasing blood sugar levels, decreasing blood pressure,  and decreasing bad cholesterol levels/ increasing good cholesterol levels.     -  The AHA strongly endorses consumption of a diet that contains a variety of foods from all the food categories with an emphasis on fruits and vegetables; fat-free and low-fat dairy products; cereal and grain products; legumes and nuts; and fish, poultry, and/or extra lean meats.    Excessive food intake, especially of foods high in saturated and trans fats, sugar, and salt, should be avoided.    Adequate water intake of roughly 1/2 of your weight in pounds, should equal the ounces of water per day you should drink.  So for instance, if you're 200 pounds, that would be 100 ounces of water per day.         Mediterranean Diet  Why follow it? Research shows. . Those who follow the Mediterranean diet have a reduced risk of heart disease  . The diet is associated with a reduced incidence of Parkinson's and Alzheimer's diseases . People following the diet may have longer life expectancies and lower rates of chronic diseases  . The Dietary Guidelines for Americans recommends the Mediterranean diet as an eating plan to promote health and prevent disease  What Is the Mediterranean Diet?  . Healthy eating plan based on typical foods and recipes of Mediterranean-style cooking . The diet is primarily a plant based diet; these foods should make up a  majority of meals   Starches - Plant based foods should make up a majority of meals - They are an important sources of vitamins, minerals, energy, antioxidants, and fiber - Choose whole grains, foods high in fiber and minimally processed items  - Typical grain sources include wheat, oats, barley, corn, brown rice, bulgar, farro, millet, polenta, couscous  - Various types of beans include chickpeas, lentils, fava beans, black beans, white beans   Fruits  Veggies - Large quantities of antioxidant rich fruits & veggies; 6 or more servings  - Vegetables can be eaten raw or lightly drizzled with oil and cooked  - Vegetables common to the traditional Mediterranean Diet include: artichokes, arugula, beets, broccoli, brussel sprouts, cabbage, carrots, celery, collard greens, cucumbers, eggplant, kale, leeks, lemons, lettuce, mushrooms, okra, onions, peas, peppers, potatoes, pumpkin, radishes, rutabaga, shallots, spinach, sweet potatoes, turnips, zucchini - Fruits common to the Mediterranean Diet include: apples, apricots, avocados, cherries, clementines, dates, figs, grapefruits, grapes, melons, nectarines, oranges, peaches, pears, pomegranates, strawberries, tangerines  Fats - Replace butter and margarine with healthy oils, such as olive oil, canola oil, and tahini  - Limit nuts to no more than a handful a day  - Nuts include walnuts, almonds, pecans, pistachios, pine nuts  - Limit or avoid candied, honey roasted or heavily salted nuts - Olives are central to the Mediterranean diet - can be eaten whole or used in a variety of dishes   Meats Protein - Limiting red meat: no more than a few times a month -  When eating red meat: choose lean cuts and keep the portion to the size of deck of cards - Eggs: approx. 0 to 4 times a week  - Fish and lean poultry: at least 2 a week  - Healthy protein sources include, chicken, [place], lean beef, lamb - Increase intake of seafood such as tuna, salmon, trout,  mackerel, shrimp, scallops - Avoid or limit high fat processed meats such as sausage and bacon  Dairy - Include moderate amounts of low fat dairy products  - Focus on healthy dairy such as fat free yogurt, skim milk, low or reduced fat cheese - Limit dairy products higher in fat such as whole or 2% milk, cheese, ice cream  Alcohol - Moderate amounts of red wine is ok  - No more than 5 oz daily for women (all ages) and men older than age 72  - No more than 10 oz of wine daily for men younger than 12  Other - Limit sweets and other desserts  - Use herbs and spices instead of salt to flavor foods  - Herbs and spices common to the traditional Mediterranean Diet include: basil, bay leaves, chives, cloves, cumin, fennel, garlic, lavender, marjoram, mint, oregano, parsley, pepper, rosemary, sage, savory, sumac, tarragon, thyme   It's not just a diet, it's a lifestyle:  . The Mediterranean diet includes lifestyle factors typical of those in the region  . Foods, drinks and meals are best eaten with others and savored . Daily physical activity is important for overall good health . This could be strenuous exercise like running and aerobics . This could also be more leisurely activities such as walking, housework, yard-work, or taking the stairs . Moderation is the key; a balanced and healthy diet accommodates most foods and drinks . Consider portion sizes and frequency of consumption of certain foods   Meal Ideas & Options:  . Breakfast:  o Whole wheat toast or whole wheat English muffins with peanut butter & hard boiled egg o Steel cut oats topped with apples & cinnamon and skim milk  o Fresh fruit: banana, strawberries, melon, berries, peaches  o Smoothies: strawberries, bananas, greek yogurt, peanut butter o Low fat greek yogurt with blueberries and granola  o Egg white omelet with spinach and mushrooms o Breakfast couscous: whole wheat couscous, apricots, skim milk, cranberries  . Sandwiches:   o Hummus and grilled vegetables (peppers, zucchini, squash) on whole wheat bread   o Grilled chicken on whole wheat pita with lettuce, tomatoes, cucumbers or tzatziki  o Tuna salad on whole wheat bread: tuna salad made with greek yogurt, olives, red peppers, capers, green onions o Garlic rosemary lamb pita: lamb sauted with garlic, rosemary, salt & pepper; add lettuce, cucumber, greek yogurt to pita - flavor with lemon juice and black pepper  . Seafood:  o Mediterranean grilled salmon, seasoned with garlic, basil, parsley, lemon juice and black pepper o Shrimp, lemon, and spinach whole-grain pasta salad made with low fat greek yogurt  o Seared scallops with lemon orzo  o Seared tuna steaks seasoned salt, pepper, coriander topped with tomato mixture of olives, tomatoes, olive oil, minced garlic, parsley, green onions and cappers  . Meats:  o Herbed greek chicken salad with kalamata olives, cucumber, feta  o Red bell peppers stuffed with spinach, bulgur, lean ground beef (or lentils) & topped with feta   o Kebabs: skewers of chicken, tomatoes, onions, zucchini, squash  o [place] burgers: made with red onions, mint, dill, lemon juice, feta  cheese topped with roasted red peppers . Vegetarian o Cucumber salad: cucumbers, artichoke hearts, celery, red onion, feta cheese, tossed in olive oil & lemon juice  o Hummus and whole grain pita points with a greek salad (lettuce, tomato, feta, olives, cucumbers, red onion) o Lentil soup with celery, carrots made with vegetable broth, garlic, salt and pepper  o Tabouli salad: parsley, bulgur, mint, scallions, cucumbers, tomato, radishes, lemon juice, olive oil, salt and pepper.     Prediabetes Eating Plan Prediabetes--also called impaired glucose tolerance or impaired fasting glucose--is a condition that causes blood sugar (blood glucose) levels to be higher than normal. Following a healthy diet can help to keep prediabetes under control. It can also help  to lower the risk of type 2 diabetes and heart disease, which are increased in people who have prediabetes. Along with regular exercise, a healthy diet:  Promotes weight loss.  Helps to control blood sugar levels.  Helps to improve the way that the body uses insulin. WHAT DO I NEED TO KNOW ABOUT THIS EATING PLAN?  Use the glycemic index (GI) to plan your meals. The index tells you how quickly a food will raise your blood sugar. Choose low-GI foods. These foods take a longer time to raise blood sugar.  Pay close attention to the amount of carbohydrates in the food that you eat. Carbohydrates increase blood sugar levels.  Keep track of how many calories you take in. Eating the right amount of calories will help you to achieve a healthy weight. Losing about 7 percent of your starting weight can help to prevent type 2 diabetes.  You may want to follow a Mediterranean diet. This diet includes a lot of vegetables, lean meats or fish, whole grains, fruits, and healthy oils and fats. WHAT FOODS CAN I EAT? Grains Whole grains, such as whole-wheat or whole-grain breads, crackers, cereals, and pasta. Unsweetened oatmeal. Bulgur. Barley. Quinoa. Brown rice. Corn or whole-wheat flour tortillas or taco shells. Vegetables Lettuce. Spinach. Peas. Beets. Cauliflower. Cabbage. Broccoli. Carrots. Tomatoes. Squash. Eggplant. Herbs. Peppers. Onions. Cucumbers. Brussels sprouts. Fruits Berries. Bananas. Apples. Oranges. Grapes. Papaya. Mango. Pomegranate. Kiwi. Grapefruit. Cherries. Meats and Other Protein Sources Seafood. Lean meats, such as chicken and [place] or lean cuts of pork and beef. Tofu. Eggs. Nuts. Beans. Dairy Low-fat or fat-free dairy products, such as yogurt, cottage cheese, and cheese. Beverages Water. Tea. Coffee. Sugar-free or diet soda. Seltzer water. Milk. Milk alternatives, such as soy or almond milk. Condiments Mustard. Relish. Low-fat, low-sugar ketchup. Low-fat, low-sugar barbecue  sauce. Low-fat or fat-free mayonnaise. Sweets and Desserts Sugar-free or low-fat pudding. Sugar-free or low-fat ice cream and other frozen treats. Fats and Oils Avocado. Walnuts. Olive oil. The items listed above may not be a complete list of recommended foods or beverages. Contact your dietitian for more options.  WHAT FOODS ARE NOT RECOMMENDED? Grains Refined white flour and flour products, such as bread, pasta, snack foods, and cereals. Beverages Sweetened drinks, such as sweet iced tea and soda. Sweets and Desserts Baked goods, such as cake, cupcakes, pastries, cookies, and cheesecake. The items listed above may not be a complete list of foods and beverages to avoid. Contact your dietitian for more information.   This information is not intended to replace advice given to you by your health care provider. Make sure you discuss any questions you have with your health care provider.   Document Released: 04/03/2015 Document Reviewed: 04/03/2015 Elsevier Interactive Patient Education [employer].     Risk factors  for prediabetes and type 2 diabetes  Researchers don't fully understand why some people develop prediabetes and type 2 diabetes and others don't.  It's clear that certain factors increase the risk, however, including:  Weight. The more fatty tissue you have, the more resistant your cells become to insulin.  Inactivity. The less active you are, the greater your risk. Physical activity helps you control your weight, uses up glucose as energy and makes your cells more sensitive to insulin.  Family history. Your risk increases if a parent or sibling has type 2 diabetes.  Race. Although it's unclear why, people of certain races - including blacks, Hispanics, American Indians and Asian-Americans - are at higher risk.  Age. Your risk increases as you get older. This may be because you tend to exercise less, lose muscle mass and gain weight as you age. But type 2 diabetes is  also increasing dramatically among children, adolescents and younger adults.  Gestational diabetes. If you developed gestational diabetes when you were pregnant, your risk of developing prediabetes and type 2 diabetes later increases. If you gave birth to a baby weighing more than 9 pounds (4 kilograms), you're also at risk of type 2 diabetes.  Polycystic ovary syndrome. For women, having polycystic ovary syndrome - a common condition characterized by irregular menstrual periods, excess hair growth and obesity - increases the risk of diabetes.  High blood pressure. Having blood pressure over 140/90 millimeters of mercury (mm Hg) is linked to an increased risk of type 2 diabetes.  Abnormal cholesterol and triglyceride levels. If you have low levels of high-density lipoprotein (HDL), or "good," cholesterol, your risk of type 2 diabetes is higher. Triglycerides are another type of fat carried in the blood. People with high levels of triglycerides have an increased risk of type 2 diabetes. Your doctor can let you know what your cholesterol and triglyceride levels are.    A good guide to good carbs: The glycemic index ---If you have diabetes, or at risk for diabetes, you know all too well that when you eat carbohydrates, your blood sugar goes up. The total amount of carbs you consume at a meal or in a snack mostly determines what your blood sugar will do. But the food itself also plays a role. A serving of white rice has almost the same effect as eating pure table sugar - a quick, high spike in blood sugar. A serving of lentils has a slower, smaller effect.  ---Picking good sources of carbs can help you control your blood sugar and your weight. Even if you don't have diabetes, eating healthier carbohydrate-rich foods can help ward off a host of chronic conditions, from heart disease to various cancers to, well, diabetes.  ---One way to choose foods is with the glycemic index (GI). This tool measures how much  a food boosts blood sugar.  The glycemic index rates the effect of a specific amount of a food on blood sugar compared with the same amount of pure glucose. A food with a glycemic index of 28 boosts blood sugar only 28% as much as pure glucose. One with a GI of 95 acts like pure glucose.  High glycemic foods result in a quick spike in insulin and blood sugar (also known as blood glucose).  Low glycemic foods have a slower, smaller effect- these are healthier for you.   Using the glycemic index Using the glycemic index is easy: choose foods in the low GI category instead of those in the high GI  category (see below), and go easy on those in between. Low glycemic index (GI of 55 or less): Most fruits and vegetables, beans, minimally processed grains, pasta, low-fat dairy foods, and nuts.  Moderate glycemic index (GI 56 to 69): White and sweet potatoes, corn, white rice, couscous, breakfast cereals such as Cream of Wheat and Mini Wheats.  High glycemic index (GI of 70 or higher): White bread, rice cakes, most crackers, bagels, cakes, doughnuts, croissants, most packaged breakfast cereals. You can see the values for 100 commons foods and get links to more at www.health.CheapToothpicks.si.  Swaps for lowering glycemic index  Instead of this high-glycemic index food Eat this lower-glycemic index food  White rice Brown rice or converted rice  Instant oatmeal Steel-cut oats  Cornflakes Bran flakes  Baked potato Pasta, bulgur  White bread Whole-grain bread  Corn Peas or leafy greens

## 2016-09-24 NOTE — Assessment & Plan Note (Addendum)
[name], MD- [facility] follows patient yearly.

## 2016-09-24 NOTE — Progress Notes (Signed)
New patient office visit note:  Impression and Recommendations:    1. Diet-controlled diabetes mellitus ([place])   2. Impaired fasting glucose   3. Paroxysmal atrial fibrillation ([facility])   4. Obstructive sleep apnea   5. Elevated PSA   6. BPH associated with nocturia   7. Erectile dysfunction, unspecified erectile dysfunction type   8. h/o Hyperthyroidism   9. Presbycusis, unspecified laterality     H/O ATRIAL FIBRILLATION, PAROXYSMAL Patient only experienced symptoms after he was on percocet 4 days;  Also occurs after too much coffee/ caffeine  OSA on CPAP Suppose to use mask but doesn't.  Did see Dr. Gwenette Greet of pulmonology\ sleep medicine in the past  Elevated PSA/ nocturia sx [name], MD- [facility] follows patient yearly.   GERD (gastroesophageal reflux disease) Occ takes TUMS only  Diet-controlled diabetes mellitus ([place]) Diet controlled - Please have glucometer available and check if any symptoms of hypo-or hyperglycemia arise. - Advised to check fasting blood sugar after he eats especially healthy the night before and especially unhealthy.  h/o Hyperthyroidism We'll obtain TSH, free T4 in near future  Erectile dysfunction Apparently T levels were - WNL's.    - Failed penile injections in the past.  - Has Bad HA- and cannot tolerate ED meds. - Patient will follow-up with Dr. Junious Silk of urology for further management of his ED and elevated PSA\ nocturia  Presbycusis Patient declined referral and said he would never wear "those things in his ears ".  Elevated blood pressure:  Patient will monitor his blood pressure at home as he is borderline high. Asked him to bring in blood pressure log next office visit.   - Patient with a lot of questions regarding disease prevention and lifestyle changes he can take to maintain or enhance his health and well-being - Pt was in the office today for 40+ minutes, with over 50% time spent in face to face  counseling of various medical concerns and in coordination of care Orders Placed This Encounter  Procedures  . CBC with Differential/Platelet  . COMPLETE METABOLIC PANEL WITH GFR  . Lipid panel  . PSA, total and free  . T4, free  . TSH  . VITAMIN D 25 Hydroxy (Vit-D Deficiency, Fractures)  . Vitamin B12  . POCT HgB A1C     New Prescriptions   No medications on file    Modified Medications   No medications on file   [facility]MA updated med list- Discontinued Medications   CYCLOBENZAPRINE (FLEXERIL) 10 MG TABLET    Take 1 tablet (10 mg total) by mouth at bedtime as needed for muscle spasms.   PREDNISONE (DELTASONE) 10 MG TABLET    4 tablets x 2 days, 3 tabs x 2 days, 2 tabs x 2 days, 1 tab x 2 days    Return in about 4 weeks (around 10/22/2016) for fbw near future--> then OV with me to discuss results.  The patient was counseled, risk factors were discussed, anticipatory guidance given.  Gross side effects, risk and benefits, and alternatives of medications discussed with patient.  Patient is aware that all medications have potential side effects and we are unable to predict every side effect or drug-drug interaction that may occur.  Expresses verbal understanding and consents to current therapy plan and treatment regimen.  Please see AVS handed out to patient at the end of our visit for further patient instructions/ counseling done pertaining to today's office visit.    Note:  This document was prepared using Dragon voice recognition software and may include unintentional dictation errors.  ----------------------------------------------------------------------------------------------------------------------    Subjective:    Chief Complaint  Patient presents with  . Establish Care    HPI: Ronald Kelley is a pleasant 75 y.o. male who presents to [place] [facility] today to review their medical history with me and establish care.   I asked the patient to  review their chronic problem list with me to ensure everything was updated and accurate.     Dr Larose Kells was former PCP.   Here to establish care.  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  Now it's run by his son.  Wife Patricia-married 64 years. 3 children ages 94, 45 and 70. Has grandchildren.  Has never smoked in his life. -  Patient has concerns with his erectile dysfunction today.  He also has questions about nighttime urination.  Also complains of some difficulty hearing.  -  Patient denies history of hypertension. Denies any cardiovascular related symptoms today.    Patient Care Team    Relationship Specialty Notifications Start End  [name], DO PCP - General Family Medicine  08/28/16   Kathee Delton, MD Referring Physician Pulmonary Disease  09/24/16    Comment: Sleep medicine-  sees patient for his OSA  Kristeen Miss, MD Consulting Physician Neurosurgery  09/27/16   Milus Banister, MD Attending Physician Gastroenterology  09/27/16   Festus Aloe, MD Consulting Physician Urology  09/27/16      Wt Readings from Last 3 Encounters:  09/24/16 156 lb (70.8 kg)  01/11/16 163 lb 4 oz (74 kg)  07/31/15 158 lb 6 oz (71.8 kg)   BP Readings from Last 3 Encounters:  09/24/16 (!) 145/79  01/11/16 122/74  07/31/15 126/78   Pulse Readings from Last 3 Encounters:  09/24/16 65  01/11/16 (!) 54  07/31/15 (!) 48   BMI Readings from Last 3 Encounters:  09/24/16 24.43 kg/m  01/11/16 25.57 kg/m  07/31/15 24.81 kg/m   Lab Results  Component Value Date   HGBA1C 6.1% 09/24/2016   HGBA1C 6.3 07/31/2015   HGBA1C 6.3 08/03/2014    Patient Active Problem List   Diagnosis Date Noted  . Impaired fasting glucose 09/27/2016  . Degenerative disk disease 09/27/2016  . Presbycusis 09/27/2016  . History of vitamin D deficiency 09/27/2016  . Diet-controlled diabetes mellitus ([place]) 09/24/2016  . BPH associated with nocturia  09/24/2016  . h/o Osteopenia 07/30/2014  . GERD (gastroesophageal reflux disease) 04/28/2013  . Annual physical exam 04/27/2013  . Erectile dysfunction 10/08/2011  . Elevated PSA/ nocturia sx 09/15/2011  . OSA on CPAP 05/13/2010  . H/O ATRIAL FIBRILLATION, PAROXYSMAL 09/28/2008  . h/o Hyperthyroidism 06/23/2008  . INGUINAL HERNIA, LEFT 06/23/2008     Past Medical History:  Diagnosis Date  . Atrial fib/flutter, transient   . Degenerative disk disease   . Diabetes mellitus    a1c 6.5, 07/2008  . Hyperthyroidism    remote h/o , no ablation  . OSA on CPAP      Past Surgical History:  Procedure Laterality Date  . HERNIA REPAIR  2010  . 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 ROTATOR CUFF REPAIR       Family History  Problem Relation Age of Onset  . Hypertension Mother   . Diabetes  Brother   . Alzheimer's disease Father 1  . Coronary artery disease Neg Hx   . Stroke Neg Hx   . Colon cancer Neg Hx   . Prostate cancer Neg Hx      History  Drug use: Unknown    History  Alcohol Use No    History  Smoking Status  . Never Smoker  Smokeless Tobacco  . Never Used    Patient's Medications  New Prescriptions   No medications on file  Previous Medications   CHOLECALCIFEROL (VITAMIN D) 1000 UNITS TABLET    Take 1,000 Units by mouth daily.   CHROMIUM 500 MCG TABS    Take 1 tablet by mouth daily.   CINNAMON PO    Take 1 tablet by mouth 2 (two) times daily.  Modified Medications   No medications on file  Discontinued Medications   CYCLOBENZAPRINE (FLEXERIL) 10 MG TABLET    Take 1 tablet (10 mg total) by mouth at bedtime as needed for muscle spasms.   PREDNISONE (DELTASONE) 10 MG TABLET    4 tablets x 2 days, 3 tabs x 2 days, 2 tabs x 2 days, 1 tab x 2 days    Allergies: Hydrocodone-acetaminophen; Oxycodone; and Oxycodone-acetaminophen  ROS   Objective:   Blood pressure (!) 145/79, pulse 65, resp. rate  16, weight 156 lb (70.8 kg), SpO2 97 %. Body mass index is 24.43 kg/m. General: Well Developed, well nourished, and in no acute distress.  Neuro: Alert and oriented x3, extra-ocular muscles intact, sensation grossly intact.  HEENT: Normocephalic, atraumatic, pupils equal round reactive to light, neck supple Skin: no gross suspicious lesions or rashes  Cardiac: Regular rate and rhythm, no murmurs rubs or gallops.  Respiratory: Essentially clear to auscultation bilaterally. Not using accessory muscles, speaking in full sentences.  Abdominal: Soft, not grossly distended Musculoskeletal: Ambulates w/o diff, FROM * 4 ext.  Vasc: less 2 sec cap RF, warm and pink  Psych:  No HI/SI, judgement and insight good, Euthymic mood. Full Affect.

## 2016-09-27 DIAGNOSIS — H9110 Presbycusis, unspecified ear: Secondary | ICD-10-CM | POA: Insufficient documentation

## 2016-09-27 DIAGNOSIS — R7301 Impaired fasting glucose: Secondary | ICD-10-CM | POA: Insufficient documentation

## 2016-09-27 DIAGNOSIS — Z8639 Personal history of other endocrine, nutritional and metabolic disease: Secondary | ICD-10-CM | POA: Insufficient documentation

## 2016-09-27 DIAGNOSIS — IMO0002 Reserved for concepts with insufficient information to code with codable children: Secondary | ICD-10-CM | POA: Insufficient documentation

## 2016-09-27 HISTORY — DX: Personal history of other endocrine, nutritional and metabolic disease: Z86.39

## 2016-09-27 NOTE — Assessment & Plan Note (Signed)
>>  ASSESSMENT AND PLAN FOR H/O HYPERTHYROIDISM WRITTEN ON 09/27/2016 11:17 PM BY OPALSKI, DEBORAH, DO  We'll obtain TSH, free T4 in near future

## 2016-09-27 NOTE — Assessment & Plan Note (Signed)
Diet controlled - Please have glucometer available and check if any symptoms of hypo-or hyperglycemia arise. - Advised to check fasting blood sugar after he eats especially healthy the night before and especially unhealthy.

## 2016-09-27 NOTE — Assessment & Plan Note (Addendum)
Apparently T levels were - WNL's.    - Failed penile injections in the past.  - Has Bad HA- and cannot tolerate ED meds. - Patient will follow-up with Dr. Junious Silk of urology for further management of his ED and elevated PSA\ nocturia

## 2016-09-27 NOTE — Assessment & Plan Note (Signed)
We'll obtain TSH, free T4 in near future

## 2016-09-27 NOTE — Assessment & Plan Note (Signed)
Patient declined referral and said he would never wear "those things in his ears ".

## 2016-09-29 DIAGNOSIS — D485 Neoplasm of uncertain behavior of skin: Secondary | ICD-10-CM | POA: Diagnosis not present

## 2016-09-29 DIAGNOSIS — L570 Actinic keratosis: Secondary | ICD-10-CM | POA: Diagnosis not present

## 2016-09-29 DIAGNOSIS — D225 Melanocytic nevi of trunk: Secondary | ICD-10-CM | POA: Diagnosis not present

## 2016-10-29 ENCOUNTER — Other Ambulatory Visit: Payer: Self-pay

## 2016-10-29 ENCOUNTER — Other Ambulatory Visit (INDEPENDENT_AMBULATORY_CARE_PROVIDER_SITE_OTHER): Payer: Medicare Other

## 2016-10-29 VITALS — BP 120/74 | HR 79 | Temp 97.4°F

## 2016-10-29 DIAGNOSIS — E559 Vitamin D deficiency, unspecified: Secondary | ICD-10-CM | POA: Diagnosis not present

## 2016-10-29 DIAGNOSIS — N401 Enlarged prostate with lower urinary tract symptoms: Secondary | ICD-10-CM

## 2016-10-29 DIAGNOSIS — N138 Other obstructive and reflux uropathy: Secondary | ICD-10-CM

## 2016-10-29 DIAGNOSIS — G4733 Obstructive sleep apnea (adult) (pediatric): Secondary | ICD-10-CM | POA: Diagnosis not present

## 2016-10-29 DIAGNOSIS — R7301 Impaired fasting glucose: Secondary | ICD-10-CM

## 2016-10-29 DIAGNOSIS — E119 Type 2 diabetes mellitus without complications: Secondary | ICD-10-CM | POA: Diagnosis not present

## 2016-10-29 DIAGNOSIS — I480 Paroxysmal atrial fibrillation: Secondary | ICD-10-CM | POA: Diagnosis not present

## 2016-10-29 DIAGNOSIS — R351 Nocturia: Secondary | ICD-10-CM

## 2016-10-29 DIAGNOSIS — R972 Elevated prostate specific antigen [PSA]: Secondary | ICD-10-CM

## 2016-10-29 DIAGNOSIS — R350 Frequency of micturition: Secondary | ICD-10-CM

## 2016-10-29 LAB — POCT URINALYSIS DIPSTICK
Bilirubin, UA: NEGATIVE
GLUCOSE UA: NEGATIVE
Ketones, UA: NEGATIVE
Leukocytes, UA: NEGATIVE
NITRITE UA: NEGATIVE
PH UA: 5.5
Spec Grav, UA: >=1.03
UROBILINOGEN UA: 0.2

## 2016-10-29 NOTE — Progress Notes (Unsigned)
Pt states that the last few nights he has been having increased urination, especially during the night and is having to get up more to urinate.  Pt denies fever, chills, nausea and vomiting.  Pt states that he has not been on any antibiotics in the recent past.  Charyl Bigger, CMA

## 2016-10-30 LAB — COMPLETE METABOLIC PANEL WITH GFR
ALT: 24 U/L (ref 9–46)
AST: 20 U/L (ref 10–35)
Albumin: 4.3 g/dL (ref 3.6–5.1)
Alkaline Phosphatase: 100 U/L (ref 40–115)
BUN: 18 mg/dL (ref 7–25)
CHLORIDE: 103 mmol/L (ref 98–110)
CO2: 30 mmol/L (ref 20–31)
CREATININE: 1.09 mg/dL (ref 0.70–1.18)
Calcium: 11.7 mg/dL — ABNORMAL HIGH (ref 8.6–10.3)
GFR, Est African American: 76 mL/min (ref >=60)
GFR, Est Non African American: 66 mL/min (ref >=60)
Glucose, Bld: 153 mg/dL — ABNORMAL HIGH (ref 65–99)
POTASSIUM: 4.2 mmol/L (ref 3.5–5.3)
Sodium: 142 mmol/L (ref 135–146)
Total Bilirubin: 1.4 mg/dL — ABNORMAL HIGH (ref 0.2–1.2)
Total Protein: 7.3 g/dL (ref 6.1–8.1)

## 2016-10-30 LAB — LIPID PANEL
CHOL/HDL RATIO: 3.6 ratio (ref <5.0)
Cholesterol: 170 mg/dL (ref <200)
HDL: 47 mg/dL (ref >40)
LDL CALC: 101 mg/dL — AB (ref <100)
TRIGLYCERIDES: 111 mg/dL (ref <150)
VLDL: 22 mg/dL (ref <30)

## 2016-10-30 LAB — CBC WITH DIFFERENTIAL/PLATELET
BASOS PCT: 0 %
Basophils Absolute: 0 cells/uL (ref 0–200)
EOS PCT: 12 %
Eosinophils Absolute: 984 cells/uL — ABNORMAL HIGH (ref 15–500)
HCT: 50.8 % — ABNORMAL HIGH (ref 38.5–50.0)
Hemoglobin: 17.3 g/dL — ABNORMAL HIGH (ref 13.2–17.1)
LYMPHS PCT: 37 %
Lymphs Abs: 3034 cells/uL (ref 850–3900)
MCH: 31.1 pg (ref 27.0–33.0)
MCHC: 34.1 g/dL (ref 32.0–36.0)
MCV: 91.2 fL (ref 80.0–100.0)
MONOS PCT: 8 %
MPV: 12.4 fL (ref 7.5–12.5)
Monocytes Absolute: 656 cells/uL (ref 200–950)
Neutro Abs: 3526 cells/uL (ref 1500–7800)
Neutrophils Relative %: 43 %
PLATELETS: 106 10*3/uL — AB (ref 140–400)
RBC: 5.57 MIL/uL (ref 4.20–5.80)
RDW: 13.6 % (ref 11.0–15.0)
WBC: 8.2 10*3/uL (ref 3.8–10.8)

## 2016-10-30 LAB — TSH: TSH: 3.57 mIU/L (ref 0.40–4.50)

## 2016-10-30 LAB — T4, FREE: FREE T4: 1 ng/dL (ref 0.8–1.8)

## 2016-10-30 LAB — VITAMIN D 25 HYDROXY (VIT D DEFICIENCY, FRACTURES): Vit D, 25-Hydroxy: 23 ng/mL — ABNORMAL LOW (ref 30–100)

## 2016-10-30 LAB — PSA, TOTAL AND FREE
PSA, % Free: 25 % — ABNORMAL LOW (ref >25)
PSA, FREE: 1.3 ng/mL
PSA, TOTAL: 5.2 ng/mL — AB (ref <=4.0)

## 2016-10-30 NOTE — Progress Notes (Signed)
Dr Madilyn Fireman,  Patient came by for a lab draw and reported some increased urination. We collected a UA. Please advise. See office note.

## 2016-10-30 NOTE — Progress Notes (Signed)
Tonya,  Please call patient to have him return for Urine culture and Microscopic urine.

## 2016-10-30 NOTE — Progress Notes (Signed)
Overall the urine does like negative. Though there is some blood. See if they can look at it with a microscopic review to see if there any whole red blood cells. If not then he made come back to re-collect a specimen. We can also happen go ahead and do a urine culture on it just to see if it grows anything out.

## 2016-10-31 ENCOUNTER — Telehealth: Payer: Self-pay

## 2016-10-31 NOTE — Telephone Encounter (Signed)
-----   Message from Narda Rutherford, [place] sent at 10/30/2016  2:40 PM EST -----   ----- Message ----- From: Hali Marry, MD Sent: 10/30/2016   2:20 PM To: Narda Rutherford, CMA    ----- Message ----- From: Narda Rutherford, CMA Sent: 10/30/2016   9:49 AM To: Hali Marry, MD    ----- Message ----- From: Fonnie Mu, CMA Sent: 10/29/2016   4:38 PM To: Narda Rutherford, CMA

## 2016-10-31 NOTE — Telephone Encounter (Signed)
Ronald Kelley, I do not see any notes about why pt called from anyone.  Not sure what needs to be done for pt. sorry

## 2016-11-05 ENCOUNTER — Ambulatory Visit (INDEPENDENT_AMBULATORY_CARE_PROVIDER_SITE_OTHER): Payer: Medicare Other | Admitting: Family Medicine

## 2016-11-05 ENCOUNTER — Encounter: Payer: Self-pay | Admitting: Family Medicine

## 2016-11-05 VITALS — BP 127/72 | HR 66 | Ht 67.0 in | Wt 159.6 lb

## 2016-11-05 DIAGNOSIS — R7301 Impaired fasting glucose: Secondary | ICD-10-CM

## 2016-11-05 DIAGNOSIS — Z8639 Personal history of other endocrine, nutritional and metabolic disease: Secondary | ICD-10-CM

## 2016-11-05 DIAGNOSIS — E0590 Thyrotoxicosis, unspecified without thyrotoxic crisis or storm: Secondary | ICD-10-CM

## 2016-11-05 DIAGNOSIS — D72819 Decreased white blood cell count, unspecified: Secondary | ICD-10-CM

## 2016-11-05 DIAGNOSIS — J329 Chronic sinusitis, unspecified: Secondary | ICD-10-CM | POA: Insufficient documentation

## 2016-11-05 DIAGNOSIS — J019 Acute sinusitis, unspecified: Secondary | ICD-10-CM

## 2016-11-05 DIAGNOSIS — R972 Elevated prostate specific antigen [PSA]: Secondary | ICD-10-CM

## 2016-11-05 DIAGNOSIS — E785 Hyperlipidemia, unspecified: Secondary | ICD-10-CM

## 2016-11-05 DIAGNOSIS — T50905A Adverse effect of unspecified drugs, medicaments and biological substances, initial encounter: Secondary | ICD-10-CM

## 2016-11-05 DIAGNOSIS — D6959 Other secondary thrombocytopenia: Secondary | ICD-10-CM

## 2016-11-05 MED ORDER — LEVOCETIRIZINE DIHYDROCHLORIDE 5 MG PO TABS: ORAL_TABLET | ORAL | 1 refills | Status: DC

## 2016-11-05 MED ORDER — VITAMIN D3 125 MCG (5000 UT) PO TABS: ORAL_TABLET | ORAL | 3 refills | Status: DC

## 2016-11-05 MED ORDER — METHYLPREDNISOLONE ACETATE 40 MG/ML IJ SUSP
40.0000 mg | Freq: Once | INTRAMUSCULAR | Status: AC
  Administered 2016-11-05: 40 mg via INTRAMUSCULAR

## 2016-11-05 MED ORDER — FLUTICASONE PROPIONATE 50 MCG/ACT NA SUSP: 2.0000 | Freq: Every day | NASAL | 6 refills | Status: DC

## 2016-11-05 NOTE — Progress Notes (Signed)
Assessment and plan:  1. Impaired fasting glucose   2. History of vitamin D deficiency   3. h/o Hyperthyroidism   4. likely allergic pansinusitis vs viral etiology   5. h/o low platelet count   6. Elevated PSA/ nocturia sx   7. Leukopenia, unspecified type   8. Hyperlipidemia, unspecified hyperlipidemia type     History of vitamin D deficiency Reck CA and vit D in 67mo Encouraged to take his 2000 IUs vitamin D3 daily.  Elevated PSA/ nocturia sx MRichrd Sox MD- [facility] follows patient yearly.   h/o Hyperthyroidism Levels within normal limits currently.  Impaired fasting glucose A1c was 6.1 on 10\25\17.    Advised to use glucometer more and check fasting as well as 2 hour postprandial blood sugars. Keep a log and write it down, bring in next office visit.   Will need to be rechecked towards the end of January-February.  - Reminded patient of importance of prudent diet and keeping up his activity levels as much as possible.  h/o Leukopenia CBC within normal limits-   white count 10.1  HLD  Your cholesterol panel looks better than prior because your good cholesterol went up. Please work on exercising 30 minutes daily at least 5 days a week.   Sinusitis Likely due to allergies but could be viral  - Seasonal and environmental allergies discussed with patient.    Preventative strategies as first line for management discussed ( such as use of Ronald-95 mask )   -  I encouraged use of sterile saline rinses such as NMilta Kelley or Ronald Kelley sinus rinses to be done twice daily and after any prolonged exposure to the environment or allergen.   - otc medicines prn discussed with pt.  - Pt desired a steroid injection today       New Prescriptions   CHOLECALCIFEROL (VITAMIN D3) 5000 UNITS TABS    5,000 IU OTC vitamin D3 daily.   FLUTICASONE (FLONASE) 50 MCG/ACT NASAL SPRAY    Place 2 sprays into both  nostrils daily.   LEVOCETIRIZINE (XYZAL) 5 MG TABLET    1 po q hs prn allergies    Modified Medications   No medications on file    Discontinued Medications   CHROMIUM 500 MCG TABS    Take 1 tablet by mouth daily.     Return for dec 18th-  repeat platlet count;  o/w 461mo/upvit d, Ca.  Anticipatory guidance and routine counseling done re: condition, txmnt options and need for follow up. All questions of patient's were answered.   Gross side effects, risk and benefits, and alternatives of medications discussed with patient.  Patient is aware that all medications have potential side effects and we are unable to predict every sideeffect or drug-drug interaction that may occur.  Expresses verbal understanding and consents to current therapy plan and treatment regiment.  Please see AVS handed out to patient at the end of our visit for additional patient instructions/ counseling done pertaining to today's office visit.  Note: This document was prepared using Dragon voice recognition software and may include unintentional dictation errors.   ----------------------------------------------------------------------------------------------------------------------  Subjective:   CC:   HaDERRIEN ANSCHUTZs a [age].o. male [name]work that was done.  1. All recent blood work that we ordered was reviewed with patient today.  Patient was counseled on all abnormalities and  we discussed dietary and lifestyle changes that could help those values (also medications when appropriate).    -  Extensive health counseling performed and all patient's concerns/ questions were addressed.   2. Acute complaints of:    Sinus congestion and drainage 4 wks-  A lot clear water blowing out his nose.   pressure in bilateral peri-nasal sinuses.  Clear d/c, no F/C,  occ cough- PND.  Tried mucinex some, alka seltzer allergy/cold.  And something for allergies. Nothing making it go away.   Not worse and not better but just persisting.     Patient Care Team    Relationship Specialty Notifications Start End  Ronald Dance, DO PCP - General Family Medicine  08/28/16   Ronald Delton, MD Referring Physician Pulmonary Disease  09/24/16    Comment: Sleep medicine-  sees patient for his OSA  Ronald Miss, MD Consulting Physician Neurosurgery  09/27/16   Ronald Banister, MD Attending Physician Gastroenterology  09/27/16   Ronald Aloe, MD Consulting Physician Urology  09/27/16      Wt Readings from Last 3 Encounters:  11/05/16 159 lb 9.6 oz (72.4 kg)  09/24/16 156 lb (70.8 kg)  01/11/16 163 lb 4 oz (74 kg)   BP Readings from Last 3 Encounters:  11/05/16 127/72  10/29/16 120/74  09/24/16 (!) 145/79   Pulse Readings from Last 3 Encounters:  11/05/16 66  10/29/16 79  09/24/16 65   BMI Readings from Last 3 Encounters:  11/05/16 25.00 kg/m  09/24/16 24.43 kg/m  01/11/16 25.57 kg/m     Patient Care Team    Relationship Specialty Notifications Start End  Ronald Dance, DO PCP - General Family Medicine  08/28/16   Ronald Delton, MD Referring Physician Pulmonary Disease  09/24/16    Comment: Sleep medicine-  sees patient for his OSA  Ronald Miss, MD Consulting Physician Neurosurgery  09/27/16   Ronald Banister, MD Attending Physician Gastroenterology  09/27/16   Ronald Aloe, MD Consulting Physician Urology  09/27/16     Full medical history updated and reviewed in the office today  Patient Active Problem List   Diagnosis Date Noted  . Impaired fasting glucose 09/27/2016    Priority: High  . History of vitamin D deficiency 09/27/2016    Priority: High  . Diet-controlled diabetes mellitus ([place]) 09/24/2016    Priority: High  . H/O ATRIAL FIBRILLATION, PAROXYSMAL 09/28/2008    Priority: High  . h/o Hyperthyroidism 06/23/2008    Priority: High  . h/o Leukopenia 11/05/2016    Priority:  Medium  . GERD (gastroesophageal reflux disease) 04/28/2013    Priority: Medium  . OSA on CPAP 05/13/2010    Priority: Medium  . h/o Osteopenia 07/30/2014    Priority: Low  . HLD 11/25/2016  . Sinusitis 11/05/2016  . Drug-induced low platelet count 11/05/2016  . Degenerative disk disease 09/27/2016  . Presbycusis 09/27/2016  . BPH associated with nocturia 09/24/2016  . Annual physical exam 04/27/2013  . Erectile dysfunction 10/08/2011  . Elevated PSA/ nocturia sx 09/15/2011  . INGUINAL HERNIA, LEFT 06/23/2008    Past Medical History:  Diagnosis Date  . Atrial fib/flutter, transient   . Degenerative disk disease   . Diabetes mellitus    a1c 6.5, 07/2008  . Hyperthyroidism    remote h/o , no ablation  . OSA on CPAP     Past Surgical History:  Procedure Laterality Date  . HERNIA REPAIR  2010  . 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 ROTATOR CUFF REPAIR      Social History  Substance Use Topics  . Smoking status: Never Smoker  . Smokeless tobacco: Never Used  . Alcohol use No    Family Hx: Family History  Problem Relation Age of Onset  . Hypertension Mother   . Diabetes Brother   . Alzheimer's disease Father 40  . Coronary artery disease Neg Hx   . Stroke Neg Hx   . Colon cancer Neg Hx   . Prostate cancer Neg Hx      Medications: Current Outpatient Prescriptions  Medication Sig Dispense Refill  . cholecalciferol (VITAMIN D) 1000 units tablet Take 1,000 Units by mouth daily.    Marland Kitchen CINNAMON PO Take 1 tablet by mouth 2 (two) times daily.    . Cholecalciferol (VITAMIN D3) 5000 units TABS 5,000 IU OTC vitamin D3 daily. 90 tablet 3  . fluticasone (FLONASE) 50 MCG/ACT nasal spray Place 2 sprays into both nostrils daily. 16 g 6  . levocetirizine (XYZAL) 5 MG tablet 1 po q hs prn allergies 90 tablet 1   No current facility-administered medications for this visit.     Allergies:  Allergies  Allergen  Reactions  . Hydrocodone-Acetaminophen Other (See Comments)    REACTION: atrial fibrilation  . Oxycodone Other (See Comments)    Atrial fibrilation  . Oxycodone-Acetaminophen Other (See Comments)    REACTION: atrial fibrilation   ok Darvocet     ROS: Review of Systems  Constitutional: Negative for chills and fever.  HENT: Positive for congestion and sore throat. Negative for ear discharge, ear pain and sinus pain.        Blowing out a lot of "clear water " from his nose  Eyes: Negative for pain and discharge.  Respiratory: Positive for cough. Negative for sputum production, shortness of breath, wheezing and stridor.        Due to PNDrip  Cardiovascular: Negative for chest pain.  Gastrointestinal: Negative for diarrhea, nausea and vomiting.  Genitourinary: Negative for dysuria.  Musculoskeletal: Negative for myalgias and neck pain.  Skin: Negative for rash.  Neurological: Negative for dizziness and headaches.  Endo/Heme/Allergies: Positive for environmental allergies.       H/o   Psychiatric/Behavioral: The patient does not have insomnia.     Objective:  Blood pressure 127/72, pulse 66, height '5\' 7"'  (1.702 m), weight 159 lb 9.6 oz (72.4 kg). Body mass index is 25 kg/m. Gen:   Well NAD, A and O *3 HEENT:    [place]/AT, EOMI,  MMM, OP- clr, TMs within normal limits bilaterally. No LAD, no TTP bilateral sinuses Lungs:   Normal work of breathing. CTA B/L, no Wh, rhonchi Heart:  Appears to be in RRR, + S1, S2 Abd:   No gross distention Exts:    warm, pink,  Brisk capillary refill, warm and well perfused.  Psych:    No HI/SI, judgement and insight good, Euthymic mood. Full Affect.    Recent Results (from the past 2160 hour(s))  POCT HgB A1C     Status: Abnormal   Collection Time: 09/24/16  1:42 PM  Result Value Ref Range   Hemoglobin A1C 6.1%   CBC with Differential/Platelet     Status: Abnormal   Collection Time: 10/29/16  7:50 AM  Result Value Ref Range   WBC 8.2 3.8 - 10.8  K/uL   RBC 5.57 4.20 - 5.80 MIL/uL   Hemoglobin 17.3 (H) 13.2 -  17.1 g/dL   HCT 50.8 (H) 38.5 - 50.0 %   MCV 91.2 80.0 - 100.0 fL   MCH 31.1 27.0 - 33.0 pg   MCHC 34.1 32.0 - 36.0 g/dL   RDW 13.6 11.0 - 15.0 %   Platelets 106 (L) 140 - 400 K/uL    Comment: Platelet clumps noted on smear-count appears adequate.   MPV 12.4 7.5 - 12.5 fL   Neutro Abs 3,526 1,500 - 7,800 cells/uL   Lymphs Abs 3,034 850 - 3,900 cells/uL   Monocytes Absolute 656 200 - 950 cells/uL   Eosinophils Absolute 984 (H) 15 - 500 cells/uL   Basophils Absolute 0 0 - 200 cells/uL   Neutrophils Relative % 43 %   Lymphocytes Relative 37 %   Monocytes Relative 8 %   Eosinophils Relative 12 %   Basophils Relative 0 %   Smear Review SEE NOTE     Comment: WBC and RBC morphology unremarkable.  COMPLETE METABOLIC PANEL WITH GFR     Status: Abnormal   Collection Time: 10/29/16  7:50 AM  Result Value Ref Range   Sodium 142 135 - 146 mmol/L   Potassium 4.2 3.5 - 5.3 mmol/L   Chloride 103 98 - 110 mmol/L   CO2 30 20 - 31 mmol/L   Glucose, Bld 153 (H) 65 - 99 mg/dL   BUN 18 7 - 25 mg/dL   Creat 1.09 0.70 - 1.18 mg/dL    Comment:   For patients > or = 76 years of age: The upper reference limit for Creatinine is approximately 13% higher for people identified as African-American.      Total Bilirubin 1.4 (H) 0.2 - 1.2 mg/dL   Alkaline Phosphatase 100 40 - 115 U/L   AST 20 10 - 35 U/L   ALT 24 9 - 46 U/L   Total Protein 7.3 6.1 - 8.1 g/dL   Albumin 4.3 3.6 - 5.1 g/dL   Calcium 11.7 (H) 8.6 - 10.3 mg/dL   GFR, Est African American 76 >=60 mL/min   GFR, Est Non African American 66 >=60 mL/min  Lipid panel     Status: Abnormal   Collection Time: 10/29/16  7:50 AM  Result Value Ref Range   Cholesterol 170 <200 mg/dL    Comment: ** Please note change in reference range(s). **      Triglycerides 111 <150 mg/dL    Comment: ** Please note change in reference range(s). **      HDL 47 >40 mg/dL    Comment: ** Please  note change in reference range(s). **      Total CHOL/HDL Ratio 3.6 <5.0 Ratio   VLDL 22 <30 mg/dL   LDL Cholesterol 101 (H) <100 mg/dL    Comment: ** Please note change in reference range(s). **     T4, free     Status: None   Collection Time: 10/29/16  7:50 AM  Result Value Ref Range   Free T4 1.0 0.8 - 1.8 ng/dL  TSH     Status: None   Collection Time: 10/29/16  7:50 AM  Result Value Ref Range   TSH 3.57 0.40 - 4.50 mIU/L  VITAMIN D 25 Hydroxy (Vit-D Deficiency, Fractures)     Status: Abnormal   Collection Time: 10/29/16  7:50 AM  Result Value Ref Range   Vit D, 25-Hydroxy 23 (L) 30 - 100 ng/mL    Comment: Vitamin D Status           25-OH Vitamin  D        Deficiency                <20 ng/mL        Insufficiency         20 - 29 ng/mL        Optimal             > or = 30 ng/mL   For 25-OH Vitamin D testing on patients on D2-supplementation and patients for whom quantitation of D2 and D3 fractions is required, the QuestAssureD 25-OH VIT D, (D2,D3), LC/MS/MS is recommended: order code [phone] (patients > 2 yrs).   PSA, total and free     Status: Abnormal   Collection Time: 10/29/16  7:50 AM  Result Value Ref Range   PSA, Total 5.2 (H) <=4.0 ng/mL   PSA, Free 1.3 Not Estab ng/mL   PSA, % Free 25 (L) >25 %    Comment:   PSA(ng/mL)      Free PSA(%)     Estimated(x) Probability                                      of Cancer(as%) 0-2.5              (*)               Approx. 1 2.6-4.0(1)         0-27(2)                   24(3) 4.1-10(4)          0-10                      56                    11-15                     28                    16-20                     20                    21-25                     16                    >or =26                   8 >10(+)             Ronald/A                      >50   References:   (1)Catalona et al.:Urology 60: 469-474 (2002)               (2)Catalona et al.:J.Urol 168: 922-925 (2002)                  Free PSA(%)   Sensitivity(%)   Specificity(%)                  < or = 25          85  19                  < or = 30          93               9               (3)Catalona et al.:JAMA 277: [id number] (1997)               (4)Catalona et al.:JAMA 279: 7510-2585 (1998)   (x)These estimates vary with age, ethnicity, family    history and  DRE results. (*)The diagnostic usefulness of % Free PSA has not been    established in patients with total PSA below 2.6 ng/mL (+)In men with PSA above 10 ng/mL, prostate cancer risk is    determined by total PSA alone.   The Total PSA value from this assay system is standardized against the equimolar PSA standard. The test result will be approximately 20% higher when compared to the [facility] Total PSA (Siemens assay). Comparison of serial PSA results should be interpreted with this fact in mind.   PSA was performed using the Beckman Coulter Immunoassay method. Values obtained from different assay methods cannot be used interchangeably. PSA levels, regardless of value, should not be interpreted as absolute evidence of the presence or absence of disease.     POCT urinalysis dipstick     Status: Abnormal   Collection Time: 10/29/16  8:19 AM  Result Value Ref Range   Color, UA yellow    Clarity, UA clear    Glucose, UA negative    Bilirubin, UA negative    Ketones, UA negative    Spec Grav, UA >=1.030    Blood, UA small    pH, UA 5.5    Protein, UA trace    Urobilinogen, UA 0.2    Nitrite, UA negative    Leukocytes, UA Negative Negative  CBC with Differential/Platelet     Status: Abnormal   Collection Time: 11/10/16  8:07 AM  Result Value Ref Range   WBC 10.1 3.8 - 10.8 K/uL   RBC 5.14 4.20 - 5.80 MIL/uL   Hemoglobin 15.7 13.2 - 17.1 g/dL   HCT 46.6 38.5 - 50.0 %   MCV 90.7 80.0 - 100.0 fL   MCH 30.5 27.0 - 33.0 pg   MCHC 33.7 32.0 - 36.0 g/dL   RDW 13.6 11.0 - 15.0 %   Platelets 163 140 - 400 K/uL   MPV 11.6 7.5 - 12.5 fL   Neutro Abs 4,949 1,500 -  7,800 cells/uL   Lymphs Abs 3,939 (H) 850 - 3,900 cells/uL   Monocytes Absolute 909 200 - 950 cells/uL   Eosinophils Absolute 303 15 - 500 cells/uL   Basophils Absolute 0 0 - 200 cells/uL   Neutrophils Relative % 49 %   Lymphocytes Relative 39 %   Monocytes Relative 9 %   Eosinophils Relative 3 %   Basophils Relative 0 %   Smear Review Criteria for review not met

## 2016-11-05 NOTE — Patient Instructions (Addendum)
depomedrol 40mg .    Your vitamin D is low which could be causing the abnormality with your calcium.   Please take 5000 IUs of vitamin D3 daily.  Over-the-counter. We will recheck in 4 months your vitamin D level and calcium level.   Your cholesterol panel looks better than prior because your good cholesterol went up. Please work on exercising 30 minutes daily at least 5 days a week.   - Seasonal and environmental allergies discussed with patient.  Preventative strategies as first line for management discussed ( such as use of N-95 mask ) and I encouraged use of sterile saline rinses such as Milta Deiters Med or [name] rinses to be done twice daily and after any prolonged exposure to the environment or allergen.        -Allergic Rhinitis Allergic rhinitis is when the mucous membranes in the nose respond to allergens. Allergens are particles in the air that cause your body to have an allergic reaction. This causes you to release allergic antibodies. Through a chain of events, these eventually cause you to release histamine into the blood stream. Although meant to protect the body, it is this release of histamine that causes your discomfort, such as frequent sneezing, congestion, and an itchy, runny nose. What are the causes? Seasonal allergic rhinitis (hay fever) is caused by pollen allergens that may come from grasses, trees, and weeds. Year-round allergic rhinitis (perennial allergic rhinitis) is caused by allergens such as house dust mites, pet dander, and mold spores. What are the signs or symptoms?  Nasal stuffiness (congestion).  Itchy, runny nose with sneezing and tearing of the eyes. How is this diagnosed? Your health care provider can help you determine the allergen or allergens that trigger your symptoms. If you and your health care provider are unable to determine the allergen, skin or blood testing may be used. Your health care provider will diagnose your condition after taking your health  history and performing a physical exam. Your health care provider may assess you for other related conditions, such as asthma, pink eye, or an ear infection. How is this treated? Allergic rhinitis does not have a cure, but it can be controlled by:  Medicines that block allergy symptoms. These may include allergy shots, nasal sprays, and oral antihistamines.  Avoiding the allergen. Hay fever may often be treated with antihistamines in pill or nasal spray forms. Antihistamines block the effects of histamine. There are over-the-counter medicines that may help with nasal congestion and swelling around the eyes. Check with your health care provider before taking or giving this medicine. If avoiding the allergen or the medicine prescribed do not work, there are many new medicines your health care provider can prescribe. Stronger medicine may be used if initial measures are ineffective. Desensitizing injections can be used if medicine and avoidance does not work. Desensitization is when a patient is given ongoing shots until the body becomes less sensitive to the allergen. Make sure you follow up with your health care provider if problems continue. Follow these instructions at home: It is not possible to completely avoid allergens, but you can reduce your symptoms by taking steps to limit your exposure to them. It helps to know exactly what you are allergic to so that you can avoid your specific triggers. Contact a health care provider if:  You have a fever.  You develop a cough that does not stop easily (persistent).  You have shortness of breath.  You start wheezing.  Symptoms interfere with normal  daily activities. This information is not intended to replace advice given to you by your health care provider. Make sure you discuss any questions you have with your health care provider. Document Released: 08/12/2001 Document Revised: 07/18/2016 Document Reviewed: 07/25/2013 Elsevier Interactive  Patient Education  2017 [employer].

## 2016-11-05 NOTE — Assessment & Plan Note (Addendum)
Reck CA and vit D in 83mo  Your vitamin D is low which could be causing the abnormality with your calcium.   Please take 5000 IUs of vitamin D3 daily.  Over-the-counter. We will recheck in 4 months your vitamin D level and calcium level.

## 2016-11-10 ENCOUNTER — Other Ambulatory Visit (INDEPENDENT_AMBULATORY_CARE_PROVIDER_SITE_OTHER): Payer: Medicare Other

## 2016-11-10 DIAGNOSIS — D696 Thrombocytopenia, unspecified: Secondary | ICD-10-CM

## 2016-11-11 LAB — CBC WITH DIFFERENTIAL/PLATELET
BASOS PCT: 0 %
Basophils Absolute: 0 cells/uL (ref 0–200)
EOS PCT: 3 %
Eosinophils Absolute: 303 cells/uL (ref 15–500)
HEMATOCRIT: 46.6 % (ref 38.5–50.0)
HEMOGLOBIN: 15.7 g/dL (ref 13.2–17.1)
LYMPHS ABS: 3939 {cells}/uL — AB (ref 850–3900)
Lymphocytes Relative: 39 %
MCH: 30.5 pg (ref 27.0–33.0)
MCHC: 33.7 g/dL (ref 32.0–36.0)
MCV: 90.7 fL (ref 80.0–100.0)
MONO ABS: 909 {cells}/uL (ref 200–950)
MPV: 11.6 fL (ref 7.5–12.5)
Monocytes Relative: 9 %
NEUTROS ABS: 4949 {cells}/uL (ref 1500–7800)
NEUTROS PCT: 49 %
Platelets: 163 10*3/uL (ref 140–400)
RBC: 5.14 MIL/uL (ref 4.20–5.80)
RDW: 13.6 % (ref 11.0–15.0)
WBC: 10.1 10*3/uL (ref 3.8–10.8)

## 2016-11-17 ENCOUNTER — Other Ambulatory Visit: Payer: BLUE CROSS/BLUE SHIELD

## 2016-11-25 DIAGNOSIS — E785 Hyperlipidemia, unspecified: Secondary | ICD-10-CM | POA: Insufficient documentation

## 2016-11-25 DIAGNOSIS — E1169 Type 2 diabetes mellitus with other specified complication: Secondary | ICD-10-CM | POA: Insufficient documentation

## 2016-11-25 NOTE — Assessment & Plan Note (Signed)
  Your cholesterol panel looks better than prior because your good cholesterol went up. Please work on exercising 30 minutes daily at least 5 days a week.

## 2016-11-25 NOTE — Assessment & Plan Note (Signed)
Ronald Sox, MD- [facility] follows patient yearly.

## 2016-11-25 NOTE — Assessment & Plan Note (Addendum)
Likely due to allergies but could be viral  - Seasonal and environmental allergies discussed with patient.    Preventative strategies as first line for management discussed ( such as use of N-95 mask )   -  I encouraged use of sterile saline rinses such as Milta Deiters Med or [name] rinses to be done twice daily and after any prolonged exposure to the environment or allergen.   - otc medicines prn discussed with pt.  - Pt desired a steroid injection today

## 2016-11-25 NOTE — Assessment & Plan Note (Addendum)
A1c was 6.1 on 10\25\17.    Advised to use glucometer more and check fasting as well as 2 hour postprandial blood sugars. Keep a log and write it down, bring in next office visit.   Will need to be rechecked towards the end of January-February.  - Reminded patient of importance of prudent diet and keeping up his activity levels as much as possible.

## 2016-11-25 NOTE — Assessment & Plan Note (Deleted)
A1c was 6.1 on 10\25\17.    Advised to use glucometer more and check fasting as well as 2 hour postprandial blood sugars. Keep a log and write it down, bring in next office visit.   Will need to be rechecked towards the end of January-February.  - Reminded patient of importance of prudent diet and keeping up his activity levels as much as possible.

## 2016-11-25 NOTE — Assessment & Plan Note (Signed)
CBC within normal limits-   white count 10.1

## 2016-11-25 NOTE — Assessment & Plan Note (Signed)
>>  ASSESSMENT AND PLAN FOR H/O HYPERTHYROIDISM WRITTEN ON 11/25/2016  2:49 PM BY OPALSKI, DEBORAH, DO  Levels within normal limits currently.

## 2016-11-25 NOTE — Assessment & Plan Note (Signed)
Levels within normal limits currently.

## 2017-01-05 ENCOUNTER — Telehealth: Payer: Self-pay | Admitting: Family Medicine

## 2017-01-05 NOTE — Telephone Encounter (Signed)
Reached out to patient regarding scheduling AWV appointment, spouse informed patient transferred physicians due to [facility] location being far.

## 2017-04-17 DIAGNOSIS — H5213 Myopia, bilateral: Secondary | ICD-10-CM | POA: Diagnosis not present

## 2017-04-17 DIAGNOSIS — H2513 Age-related nuclear cataract, bilateral: Secondary | ICD-10-CM | POA: Diagnosis not present

## 2017-04-23 DIAGNOSIS — H25812 Combined forms of age-related cataract, left eye: Secondary | ICD-10-CM | POA: Diagnosis not present

## 2017-04-23 DIAGNOSIS — H2512 Age-related nuclear cataract, left eye: Secondary | ICD-10-CM | POA: Diagnosis not present

## 2017-05-07 DIAGNOSIS — H2511 Age-related nuclear cataract, right eye: Secondary | ICD-10-CM | POA: Diagnosis not present

## 2017-05-07 DIAGNOSIS — H25811 Combined forms of age-related cataract, right eye: Secondary | ICD-10-CM | POA: Diagnosis not present

## 2017-05-07 DIAGNOSIS — H21561 Pupillary abnormality, right eye: Secondary | ICD-10-CM | POA: Diagnosis not present

## 2017-06-01 DIAGNOSIS — L570 Actinic keratosis: Secondary | ICD-10-CM | POA: Diagnosis not present

## 2017-07-22 ENCOUNTER — Telehealth: Payer: Self-pay | Admitting: Family Medicine

## 2017-07-27 ENCOUNTER — Encounter: Payer: Self-pay | Admitting: Family Medicine

## 2017-07-27 ENCOUNTER — Ambulatory Visit (INDEPENDENT_AMBULATORY_CARE_PROVIDER_SITE_OTHER): Payer: Medicare Other | Admitting: Family Medicine

## 2017-07-27 VITALS — BP 127/66 | HR 67 | Wt 149.0 lb

## 2017-07-27 DIAGNOSIS — I950 Idiopathic hypotension: Secondary | ICD-10-CM

## 2017-07-27 DIAGNOSIS — R42 Dizziness and giddiness: Secondary | ICD-10-CM

## 2017-07-27 DIAGNOSIS — H6980 Other specified disorders of Eustachian tube, unspecified ear: Secondary | ICD-10-CM

## 2017-07-27 DIAGNOSIS — J329 Chronic sinusitis, unspecified: Secondary | ICD-10-CM

## 2017-07-27 DIAGNOSIS — R2689 Other abnormalities of gait and mobility: Secondary | ICD-10-CM

## 2017-07-27 NOTE — Progress Notes (Signed)
Pt here for an acute care OV today   Impression and Recommendations:    1. Idiopathic hypotension   2. Balance disorder   3. ETD (Eustachian tube dysfunction), unspecified laterality   4. Vertigo- BPPV   5. Chronic sinusitis, unspecified location     1) drink more water-  One half of your weigh in uncs of water per day  2) slow changes in position over 1 min with each change- lying to sitting, and sitting to standing- take a minute for your body to adjust.  3) Room spinning- is vertigo from allergies/ sinuses.  Take flonase daily after neil med sinus rinses twice daily- everyday.  Do exercises daily - see below    No problem-specific Assessment & Plan notes found for this encounter.   The patient was counseled, risk factors were discussed, anticipatory guidance given.  New Prescriptions   No medications on file    Discontinued Medications   FLUTICASONE (FLONASE) 50 MCG/ACT NASAL SPRAY    Place 2 sprays into both nostrils daily.   LEVOCETIRIZINE (XYZAL) 5 MG TABLET    1 po q hs prn allergies    Modified Medications   No medications on file    No orders of the defined types were placed in this encounter.    Gross side effects, risk and benefits, and alternatives of medications and treatment plan in general discussed with patient.  Patient is aware that all medications have potential side effects and we are unable to predict every side effect or drug-drug interaction that may occur.   Patient will call with any questions prior to using medication if they have concerns.  Expresses verbal understanding and consents to current therapy and treatment regimen.  No barriers to understanding were identified.  Red flag symptoms and signs discussed in detail.  Patient expressed understanding regarding what to do in case of emergency\urgent symptoms  Please see AVS handed out to patient at the end of our visit for further patient instructions/ counseling done pertaining to today's  office visit.   Return if symptoms worsen or fail to improve over next c wks.     Note: This document was prepared using Dragon voice recognition software and may include unintentional dictation errors.  [name] 9:48 AM --------------------------------------------------------------------------------------------------------------------------------------------------------------------------------------------------------------------------------------------    Subjective:    CC:  Chief Complaint  Patient presents with  . Dizziness    x 6 months  . Knee Pain    x 3 months    HPI: HAEDEN HUDOCK is a 76 y.o. male who presents to [place] at [facility] today for issues as discussed below.  Dizzy- 6 mo or so ago.  Notices it when he gets up at night  from sleep to go to the BR.  Turns over in bed--> makes room spin. Doesn't happen all the time- just occ.  Recently felt off balance so much he felt like the had to take a step backwrds.     No problems updated.   Wt Readings from Last 3 Encounters:  07/27/17 149 lb (67.6 kg)  11/05/16 159 lb 9.6 oz (72.4 kg)  09/24/16 156 lb (70.8 kg)   BP Readings from Last 3 Encounters:  07/27/17 127/66  11/05/16 127/72  10/29/16 120/74   BMI Readings from Last 3 Encounters:  07/27/17 23.34 kg/m  11/05/16 25.00 kg/m  09/24/16 24.43 kg/m     Patient Care Team    Relationship Specialty Notifications Start End  [name], DO  PCP - General Family Medicine  08/28/16   Clance, Armando Reichert, MD Referring Physician Pulmonary Disease  09/24/16    Comment: Sleep medicine-  sees patient for his OSA  Kristeen Miss, MD Consulting Physician Neurosurgery  09/27/16   Milus Banister, MD Attending Physician Gastroenterology  09/27/16   Festus Aloe, MD Consulting Physician Urology  09/27/16      Patient Active Problem List   Diagnosis Date Noted  . Impaired fasting glucose 09/27/2016    Priority: High  .  History of vitamin D deficiency 09/27/2016    Priority: High  . Diet-controlled diabetes mellitus ([place]) 09/24/2016    Priority: High  . H/O ATRIAL FIBRILLATION, PAROXYSMAL 09/28/2008    Priority: High  . h/o Hyperthyroidism 06/23/2008    Priority: High  . h/o Leukopenia 11/05/2016    Priority: Medium  . GERD (gastroesophageal reflux disease) 04/28/2013    Priority: Medium  . OSA on CPAP 05/13/2010    Priority: Medium  . h/o Osteopenia 07/30/2014    Priority: Low  . HLD 11/25/2016  . Sinusitis 11/05/2016  . Drug-induced low platelet count 11/05/2016  . Degenerative disk disease 09/27/2016  . Presbycusis 09/27/2016  . BPH associated with nocturia 09/24/2016  . Annual physical exam 04/27/2013  . Erectile dysfunction 10/08/2011  . Elevated PSA/ nocturia sx 09/15/2011  . INGUINAL HERNIA, LEFT 06/23/2008    Past Medical history, Surgical history, Family history, Social history, Allergies and Medications have been entered into the medical record, reviewed and changed as needed.    Current Meds  Medication Sig  . CINNAMON PO Take 1 tablet by mouth 2 (two) times daily.  Marland Kitchen ibuprofen (ADVIL,MOTRIN) 200 MG tablet Take 200 mg by mouth every 6 (six) hours as needed.  . [DISCONTINUED] fluticasone (FLONASE) 50 MCG/ACT nasal spray Place 2 sprays into both nostrils daily.  . [DISCONTINUED] levocetirizine (XYZAL) 5 MG tablet 1 po q hs prn allergies    Allergies:  Allergies  Allergen Reactions  . Hydrocodone-Acetaminophen Other (See Comments)    REACTION: atrial fibrilation  . Oxycodone Other (See Comments)    Atrial fibrilation  . Oxycodone-Acetaminophen Other (See Comments)    REACTION: atrial fibrilation   ok Darvocet     Review of Systems: General:   Denies fever, chills, unexplained weight loss.  Optho/Auditory:   Denies visual changes, blurred vision/LOV Respiratory:   Denies wheeze, DOE more than baseline levels.  Cardiovascular:   Denies chest pain, palpitations, new  onset peripheral edema  Gastrointestinal:   Denies nausea, vomiting, diarrhea, abd pain.  Genitourinary: Denies dysuria, freq/ urgency, flank pain or discharge from genitals.  Endocrine:     Denies hot or cold intolerance, polyuria, polydipsia. Musculoskeletal:   Denies unexplained myalgias, joint swelling, unexplained arthralgias, gait problems.  Skin:  Denies new onset rash, suspicious lesions Neurological:     Denies dizziness, unexplained weakness, numbness  Psychiatric/Behavioral:   Denies mood changes, suicidal or homicidal ideations, hallucinations    Objective:   Blood pressure 127/66, pulse 67, weight 149 lb (67.6 kg), SpO2 98 %. Body mass index is 23.34 kg/m. General:  Well Developed, well nourished, appropriate for stated age.  Neuro:  Alert and oriented,  extra-ocular muscles intact  HEENT:  Normocephalic, atraumatic, neck supple Skin:  no gross rash, warm, pink. Cardiac:  RRR, S1 S2 Respiratory:  ECTA B/L and A/P, Not using accessory muscles, speaking in full sentences- unlabored. Vascular:  Ext warm, no cyanosis apprec.; cap RF less 2 sec. Psych:  No HI/SI, judgement and  insight good, Euthymic mood. Full Affect.

## 2017-07-27 NOTE — Patient Instructions (Addendum)
-  Because of the arteries naturally harening withage U hey ae not as plable and cannot adjust t hages I psitin as quickl and eail a t did whn you are younger.  So peasemake ajustens to [employer] with slow change when yo get up from bed stand fr 1 minute before you even bein to walk liewise if yo'r laying down and it o the ede of the bed for  mnute befre o g o sand.  So  make changes in positon very slowly.   1) drink more water 2) slow changes in position over 1 min with each change- lying to sitting, and sitting to standing- take a minute for your body to adjust. 3) Room spinning- is vertigo from allergies/ sinuses.  Take flonase daily after neil med sinus rinses twice daily- everyday.  Do exercises daily - see below  Please realize, EXERCISE IS MEDICINE!  -  American Heart Association [facility]) guidelines for exercise : If you are in good health, without any medical conditions, you should engage in 150 minutes of moderate intensity aerobic activity per week.  This means you should be huffing and puffing throughout your workout.   Engaging in regular exercise will improve brain function and memory, as well as improve mood, boost immune system and help with weight management.  As well as the other, more well-known effects of exercise such as decreasing blood sugar levels, decreasing blood pressure,  and decreasing bad cholesterol levels/ increasing good cholesterol levels.     -  The AHA strongly endorses consumption of a diet that contains a variety of foods from all the food categories with an emphasis on fruits and vegetables; fat-free and low-fat dairy products; cereal and grain products; legumes and nuts; and fish, poultry, and/or extra lean meats.    Excessive food intake, especially of foods high in saturated and trans fats, sugar, and salt, should be avoided.    Adequate water intake of roughly 1/2 of your weight in pounds, should equal the ounces of water per day you should drink.  So for  instance, if you're 200 pounds, that would be 100 ounces of water per day.           Orthostatic Hypotension Orthostatic hypotension is a sudden drop in blood pressure that happens when you quickly change positions, such as when you get up from a seated or lying position. Blood pressure is a measurement of how strongly, or weakly, your blood is pressing against the walls of your arteries. Arteries are blood vessels that carry blood from your heart throughout your body. When blood pressure is too low, you may not get enough blood to your brain or to the rest of your organs. This can cause weakness, light-headedness, rapid heartbeat, and fainting. This can last for just a few seconds or for up to a few minutes. Orthostatic hypotension is usually not a serious problem. However, if it happens frequently or gets worse, it may be a sign of something more serious. What are the causes? This condition may be caused by:  Sudden changes in posture, such as standing up quickly after you have been sitting or lying down.  Blood loss.  Loss of body fluids (dehydration).  Heart problems.  Hormone (endocrine) problems.  Pregnancy.  Severe infection.  Lack of certain nutrients.  Severe allergic reactions (anaphylaxis).  Certain medicines, such as blood pressure medicine or medicines that make the body lose excess fluids (diuretics). Sometimes, this condition can be caused by not  taking medicine as directed, such as taking too much of a certain medicine.  What increases the risk? Certain factors can make you more likely to develop orthostatic hypotension, including:  Age. Risk increases as you get older.  Conditions that affect the heart or the central nervous system.  Taking certain medicines, such as blood pressure medicine or diuretics.  Being pregnant.  What are the signs or symptoms? Symptoms of this condition may include:  Weakness.  Light-headedness.  Dizziness.  Blurred  vision.  Fatigue.  Rapid heartbeat.  Fainting, in severe cases.  How is this diagnosed? This condition is diagnosed based on:  Your medical history.  Your symptoms.  Your blood pressure measurement. Your health care provider will check your blood pressure when you are: ? Lying down. ? Sitting. ? Standing.  A blood pressure reading is recorded as two numbers, such as "120 over 80" (or 120/80). The first ("top") number is called the systolic pressure. It is a measure of the pressure in your arteries as your heart beats. The second ("bottom") number is called the diastolic pressure. It is a measure of the pressure in your arteries when your heart relaxes between beats. Blood pressure is measured in a unit called mm Hg. Healthy blood pressure for adults is 120/80. If your blood pressure is below 90/60, you may be diagnosed with hypotension. Other information or tests that may be used to diagnose orthostatic hypotension include:  Your other vital signs, such as your heart rate and temperature.  Blood tests.  Tilt table test. For this test, you will be safely secured to a table that moves you from a lying position to an upright position. Your heart rhythm and blood pressure will be monitored during the test.  How is this treated? Treatment for this condition may include:  Changing your diet. This may involve eating more salt (sodium) or drinking more water.  Taking medicines to raise your blood pressure.  Changing the dosage of certain medicines you are taking that might be lowering your blood pressure.  Wearing compression stockings. These stockings help to prevent blood clots and reduce swelling in your legs.  In some cases, you may need to go to the hospital for:  Fluid replacement. This means you will receive fluids through an IV tube.  Blood replacement. This means you will receive donated blood through an IV tube (transfusion).  Treating an infection or heart problems,  if this applies.  Monitoring. You may need to be monitored while medicines that you are taking wear off.  Follow these instructions at home: Eating and drinking   Drink enough fluid to keep your urine clear or pale yellow.  Eat a healthy diet and follow instructions from your health care provider about eating or drinking restrictions. A healthy diet includes: ? Fresh fruits and vegetables. ? Whole grains. ? Lean meats. ? Low-fat dairy products.  Eat extra salt only as directed. Do not add extra salt to your diet unless your health care provider told you to do that.  Eat frequent, small meals.  Avoid standing up suddenly after eating. Medicines  Take over-the-counter and prescription medicines only as told by your health care provider. ? Follow instructions from your health care provider about changing the dosage of your current medicines, if this applies. ? Do not stop or adjust any of your medicines on your own. General instructions  Wear compression stockings as told by your health care provider.  Get up slowly from lying down or sitting  positions. This gives your blood pressure a chance to adjust.  Avoid hot showers and excessive heat as directed by your health care provider.  Return to your normal activities as told by your health care provider. Ask your health care provider what activities are safe for you.  Do not use any products that contain nicotine or tobacco, such as cigarettes and e-cigarettes. If you need help quitting, ask your health care provider.  Keep all follow-up visits as told by your health care provider. This is important. Contact a health care provider if:  You vomit.  You have diarrhea.  You have a fever for more than 2-3 days.  You feel more thirsty than usual.  You feel weak and tired. Get help right away if:  You have chest pain.  You have a fast or irregular heartbeat.  You develop numbness in any part of your body.  You cannot  move your arms or your legs.  You have trouble speaking.  You become sweaty or feel lightheaded.  You faint.  You feel short of breath.  You have trouble staying awake.  You feel confused. This information is not intended to replace advice given to you by your health care provider. Make sure you discuss any questions you have with your health care provider. Document Released: 11/07/2002 Document Revised: 08/05/2016 Document Reviewed: 05/09/2016 Elsevier Interactive Patient Education  2018 [employer].     How to Treat Vertigo at Home with Exercises  What is Vertigo?  Vertigo is a relatively common symptom most often associated with conditions such as sinusitis (inflammation of your sinuses due to viruses, allergies, or bacterial infections), or an inner ear infection or ear trauma.   It can be brought on by trauma (e.g. a blow to the head or whiplash) or more serious things like minor strokes.   Symptoms can also be brought on by normal degenerative changes to your inner ear that occur with aging.  The condition tends to be more commonly seen in the elderly but it can occur in all ages.    Patients most often complain of dizziness, as if the room is spinning around them.   Symptoms are provoked by quick head movements or changes in position like going from standing to lying in bed, or even turning over in bed.   It may present with nausea and/or vomiting, and can be very debilitating to some folks.    By far the most common cause, known as Benign Paroxysmal Positional Vertigo (BPPV), is categorized by a sudden onset of symptoms, that are intense but short-lived (60 seconds or less), which is triggered by a change in head position.   Symptoms usually dissipate if you stay in one position and do not move your head.   Within the inner ear are collections of calcium carbonate crystals referred to as "otoliths" which may become dislodged from their normal position and migrate into the  semicircular canals of the inner ear, throwing off your body's ability to sense where you are in space.     Fig. 921 Anatomy of the Right Osseous Labyrinth. Antonieta Iba. Anatomy of the Human Body. 1918.            What Else Could Be Behind My Vertigo?  Some other causes of vertigo include:  Meniere's disease (disorder of inner ear with ringing in ears, feeling of fullness/pressure within ear, and fluctuating hearing loss) Tumours Neurological disorders e.g. Multiple Sclerosis Motion Sickness (lack of coordination between visual stimuli, inner  ear balance and positional sense) Migraine Labyrinthitis (inflammation of the fluid-filled tubes and sacs within the inner ear; may also be associated with changes in hearing) Vestibular neuritis (inflammation of the nerves associated with transmission of sensory info from the inner ear; usually of viral origins)  How it can be treated/cured? While certain medications have been prescribed for vertigo including Lorazepam your doing well 7 house the house going organizing and getting things ready for sale with the and Meclizine (for motion sickness), there exists no evidence to support a recommendation of any medication in the routine treatment of BPPV.  Clinical trials have demonstrated that repositioning techniques (listed below) are a superior option for management Otis Dials et al., 2008).    Figure above:  (A) Instructions for the modified Epley procedure (MEP) for left ear posterior canal benign paroxysmal positional vertigo (PC-BPPV). For right ear BPPV, the procedure has to be performed in the opposite direction, starting with the head turned to the right side.  1. Start by sitting on a bed with your head turned 45 to the left. Place a pillow behind you so that on lying back it will be under your shoulders.  2. Lie back quickly with shoulders on the pillow, neck extended, and head resting on the bed. In this position, the affected (left) ear is  underneath. Wait for 30 secondS.  3. Turn your head 90 to the right (without raising it), and wait again for 30 seconds.  4. Turn your body and head another 90 to the right, and wait for another 30 seconds.  5. Sit up on the right side. This maneuver should be performed three times a day. Repeat this daily until you are free from positional vertigo for 24 hours.   (B) Instructions for the modified Semont maneuver (MSM) for left ear PC-BPPV. For right ear BPPV, the maneuver has to be performed in the opposite direction, starting with the head turned toward the left ear.  1. Sit upright on a bed with your head turned 45 toward the right ear.  2. Drop quickly to the left side, so that your head touches the bed behind your left ear. Wait 30 seconds.  3. Move head and trunk in a swift movement toward the other side without stopping in the upright position, so that your head comes to rest on the right side of your forehead. Wait again for 30 seconds.  4. Sit up again.  This maneuver should be performed three times a day. Repeat this daily until you are free from positional vertigo symptoms for 24 hours.   (   See the video in the supplementary material on the NeurologyWeb site; go to http://www.neurology.org/content/63/1/150/F1.expansion.html   )     You can also try this motion at home as well- Self-Treatment of Benign Paroxysmal Positional Vertigo Benign Paroxysmal Positioning Vertigo is caused by loose inner ear crystals in the inner ear that migrate while sleeping to the back-bottom inner ear balance canal, the so-called "posterior semi-circular canal." The maneuver demonstrated below is the way to reposition the loose crystals so that the symptoms caused by the loose crystals go away. You may have a floating, swaying sense while walking or sitting for a few days after this procedure.

## 2017-07-31 ENCOUNTER — Ambulatory Visit (INDEPENDENT_AMBULATORY_CARE_PROVIDER_SITE_OTHER): Payer: Medicare Other | Admitting: Family Medicine

## 2017-07-31 ENCOUNTER — Emergency Department (HOSPITAL_COMMUNITY): Payer: Medicare Other

## 2017-07-31 ENCOUNTER — Encounter: Payer: Self-pay | Admitting: Family Medicine

## 2017-07-31 ENCOUNTER — Inpatient Hospital Stay (HOSPITAL_COMMUNITY)
Admission: EM | Admit: 2017-07-31 | Discharge: 2017-08-01 | DRG: 310 | Disposition: A | Discharge status: Home / Self Care | Payer: Medicare Other | Attending: Family Medicine | Admitting: Family Medicine

## 2017-07-31 ENCOUNTER — Inpatient Hospital Stay (HOSPITAL_COMMUNITY): Payer: Medicare Other

## 2017-07-31 ENCOUNTER — Encounter (HOSPITAL_COMMUNITY): Payer: Self-pay

## 2017-07-31 VITALS — BP 82/55 | HR 118 | Ht 67.0 in | Wt 146.4 lb

## 2017-07-31 DIAGNOSIS — R93429 Abnormal radiologic findings on diagnostic imaging of unspecified kidney: Secondary | ICD-10-CM | POA: Insufficient documentation

## 2017-07-31 DIAGNOSIS — Z885 Allergy status to narcotic agent status: Secondary | ICD-10-CM | POA: Insufficient documentation

## 2017-07-31 DIAGNOSIS — E0590 Thyrotoxicosis, unspecified without thyrotoxic crisis or storm: Secondary | ICD-10-CM | POA: Diagnosis not present

## 2017-07-31 DIAGNOSIS — G4733 Obstructive sleep apnea (adult) (pediatric): Secondary | ICD-10-CM | POA: Diagnosis present

## 2017-07-31 DIAGNOSIS — R351 Nocturia: Secondary | ICD-10-CM | POA: Diagnosis present

## 2017-07-31 DIAGNOSIS — Z8249 Family history of ischemic heart disease and other diseases of the circulatory system: Secondary | ICD-10-CM

## 2017-07-31 DIAGNOSIS — Z820 Family history of epilepsy and other diseases of the nervous system: Secondary | ICD-10-CM | POA: Diagnosis not present

## 2017-07-31 DIAGNOSIS — N401 Enlarged prostate with lower urinary tract symptoms: Secondary | ICD-10-CM | POA: Diagnosis present

## 2017-07-31 DIAGNOSIS — I484 Atypical atrial flutter: Secondary | ICD-10-CM

## 2017-07-31 DIAGNOSIS — R946 Abnormal results of thyroid function studies: Secondary | ICD-10-CM | POA: Diagnosis not present

## 2017-07-31 DIAGNOSIS — E785 Hyperlipidemia, unspecified: Secondary | ICD-10-CM | POA: Diagnosis present

## 2017-07-31 DIAGNOSIS — R7989 Other specified abnormal findings of blood chemistry: Secondary | ICD-10-CM | POA: Diagnosis present

## 2017-07-31 DIAGNOSIS — R079 Chest pain, unspecified: Secondary | ICD-10-CM

## 2017-07-31 DIAGNOSIS — N400 Enlarged prostate without lower urinary tract symptoms: Secondary | ICD-10-CM | POA: Diagnosis not present

## 2017-07-31 DIAGNOSIS — E119 Type 2 diabetes mellitus without complications: Secondary | ICD-10-CM | POA: Diagnosis not present

## 2017-07-31 DIAGNOSIS — I480 Paroxysmal atrial fibrillation: Secondary | ICD-10-CM | POA: Diagnosis present

## 2017-07-31 DIAGNOSIS — I700 Atherosclerosis of aorta: Secondary | ICD-10-CM | POA: Diagnosis present

## 2017-07-31 DIAGNOSIS — I4891 Unspecified atrial fibrillation: Secondary | ICD-10-CM | POA: Diagnosis not present

## 2017-07-31 DIAGNOSIS — R5383 Other fatigue: Secondary | ICD-10-CM | POA: Diagnosis not present

## 2017-07-31 DIAGNOSIS — R9431 Abnormal electrocardiogram [ECG] [EKG]: Secondary | ICD-10-CM

## 2017-07-31 DIAGNOSIS — R031 Nonspecific low blood-pressure reading: Secondary | ICD-10-CM | POA: Diagnosis not present

## 2017-07-31 DIAGNOSIS — Z833 Family history of diabetes mellitus: Secondary | ICD-10-CM

## 2017-07-31 DIAGNOSIS — R0602 Shortness of breath: Secondary | ICD-10-CM | POA: Diagnosis not present

## 2017-07-31 DIAGNOSIS — I10 Essential (primary) hypertension: Secondary | ICD-10-CM | POA: Diagnosis present

## 2017-07-31 DIAGNOSIS — E210 Primary hyperparathyroidism: Secondary | ICD-10-CM | POA: Diagnosis present

## 2017-07-31 LAB — TROPONIN I: Troponin I: <0.03 ng/mL (ref <0.03)

## 2017-07-31 LAB — URINALYSIS, ROUTINE W REFLEX MICROSCOPIC
BACTERIA UA: NONE SEEN
Bilirubin Urine: NEGATIVE
GLUCOSE, UA: NEGATIVE mg/dL
KETONES UR: 5 mg/dL — AB
LEUKOCYTES UA: NEGATIVE
NITRITE: NEGATIVE
Protein, ur: NEGATIVE mg/dL
SPECIFIC GRAVITY, URINE: 1.018 (ref 1.005–1.030)
Squamous Epithelial / LPF: NONE SEEN
pH: 6 (ref 5.0–8.0)

## 2017-07-31 LAB — BASIC METABOLIC PANEL
Anion gap: 9 (ref 5–15)
BUN: 24 mg/dL — AB (ref 6–20)
CALCIUM: 12.1 mg/dL — AB (ref 8.9–10.3)
CO2: 24 mmol/L (ref 22–32)
Chloride: 106 mmol/L (ref 101–111)
Creatinine, Ser: 1.26 mg/dL — ABNORMAL HIGH (ref 0.61–1.24)
GFR calc Af Amer: >60 mL/min (ref >60)
GFR calc non Af Amer: 53 mL/min — ABNORMAL LOW (ref >60)
GLUCOSE: 142 mg/dL — AB (ref 65–99)
Potassium: 4.9 mmol/L (ref 3.5–5.1)
Sodium: 139 mmol/L (ref 135–145)

## 2017-07-31 LAB — T4, FREE: Free T4: 1.6 ng/dL — ABNORMAL HIGH (ref 0.61–1.12)

## 2017-07-31 LAB — CBC
HEMATOCRIT: 44.6 % (ref 39.0–52.0)
Hemoglobin: 14.8 g/dL (ref 13.0–17.0)
MCH: 30 pg (ref 26.0–34.0)
MCHC: 33.2 g/dL (ref 30.0–36.0)
MCV: 90.3 fL (ref 78.0–100.0)
Platelets: 222 10*3/uL (ref 150–400)
RBC: 4.94 MIL/uL (ref 4.22–5.81)
RDW: 12.7 % (ref 11.5–15.5)
WBC: 11.1 10*3/uL — ABNORMAL HIGH (ref 4.0–10.5)

## 2017-07-31 LAB — TSH: TSH: 0.012 u[IU]/mL — AB (ref 0.350–4.500)

## 2017-07-31 LAB — APTT: aPTT: 30 seconds (ref 24–36)

## 2017-07-31 LAB — PROTIME-INR
INR: 1.06
PROTHROMBIN TIME: 13.7 s (ref 11.4–15.2)

## 2017-07-31 LAB — MAGNESIUM: MAGNESIUM: 1.3 mg/dL — AB (ref 1.7–2.4)

## 2017-07-31 LAB — PHOSPHORUS: Phosphorus: 1.9 mg/dL — ABNORMAL LOW (ref 2.5–4.6)

## 2017-07-31 MED ORDER — HYDRALAZINE HCL 20 MG/ML IJ SOLN: 10.0000 mg | Freq: Three times a day (TID) | INTRAMUSCULAR | Status: DC | PRN

## 2017-07-31 MED ORDER — SODIUM CHLORIDE 0.9 % IV SOLN
INTRAVENOUS | Status: DC
  Administered 2017-08-01: 06:00:00 via INTRAVENOUS

## 2017-07-31 MED ORDER — INSULIN ASPART 100 UNIT/ML ~~LOC~~ SOLN: 0.0000 [IU] | Freq: Three times a day (TID) | SUBCUTANEOUS | Status: DC

## 2017-07-31 MED ORDER — DILTIAZEM HCL 100 MG IV SOLR
5.0000 mg/h | INTRAVENOUS | Status: DC
  Administered 2017-07-31: 5 mg/h via INTRAVENOUS
  Filled 2017-07-31: qty 100

## 2017-07-31 MED ORDER — MAGNESIUM SULFATE 2 GM/50ML IV SOLN
2.0000 g | INTRAVENOUS | Status: AC
  Administered 2017-07-31 (×2): 2 g via INTRAVENOUS
  Filled 2017-07-31 (×2): qty 50

## 2017-07-31 MED ORDER — ONDANSETRON HCL 4 MG/2ML IJ SOLN: 4.0000 mg | Freq: Four times a day (QID) | INTRAMUSCULAR | Status: DC | PRN

## 2017-07-31 MED ORDER — ACETAMINOPHEN 325 MG PO TABS: 650.0000 mg | ORAL_TABLET | ORAL | Status: DC | PRN

## 2017-07-31 MED ORDER — ASPIRIN 81 MG PO CHEW
324.0000 mg | CHEWABLE_TABLET | Freq: Every day | ORAL | Status: DC
  Administered 2017-07-31 – 2017-08-01 (×2): 324 mg via ORAL
  Filled 2017-07-31 (×2): qty 4

## 2017-07-31 MED ORDER — SODIUM CHLORIDE 0.9 % IV BOLUS (SEPSIS)
500.0000 mL | Freq: Once | INTRAVENOUS | Status: AC
  Administered 2017-07-31: 500 mL via INTRAVENOUS

## 2017-07-31 MED ORDER — DILTIAZEM LOAD VIA INFUSION
10.0000 mg | Freq: Once | INTRAVENOUS | Status: AC
  Administered 2017-07-31: 10 mg via INTRAVENOUS
  Filled 2017-07-31: qty 10

## 2017-07-31 NOTE — ED Notes (Signed)
Spoke with Pharmacy stated will be sending medication.

## 2017-07-31 NOTE — ED Notes (Signed)
To ultrasound

## 2017-07-31 NOTE — ED Notes (Signed)
Call placed to admitting doctor to ask about the cardizem

## 2017-07-31 NOTE — Addendum Note (Signed)
Addended by: Lanier Prude D on: 07/31/2017 02:11 PM   Modules accepted: Orders

## 2017-07-31 NOTE — ED Notes (Signed)
Pt in nsr  He was told by the doctor that he wound not have to have the medicine any longer  No order

## 2017-07-31 NOTE — Progress Notes (Signed)
Pt here for an acute care OV today   Impression and Recommendations:    1. Acute new onset Atypical atrial flutter (HCC)   2. intermittent left sided chest pain   3. Excessive postexertional fatigue   4. Blood pressure abnormally low   5. Abnormal electrocardiogram (ECG) (EKG)    - EKG consistent with atrial flutter with elevated rate ranging from the 140s to 160s.  Patient currently without chest pain but history as in history of present illness below.  ST elevation in anterior lateral leads concerning for cardiac ischemia especially in view of new rhythm abnormality as well as symptoms.  - Patient signed AMA papers and would not go by ambulance and understands risks of possible death on way to hospital.  Patient's wife Fraser Din also came in and I explained my concerns to her.  She will take him right now to the hospital.  All questions were answered from the patient as well as wife prior to him leaving our office.  Again prior to leaving, patient did not have any active chest pain.   No problem-specific Assessment & Plan notes found for this encounter.   The patient was counseled, risk factors were discussed, anticipatory guidance given.  New Prescriptions   No medications on file    Discontinued Medications   No medications on file    Modified Medications   No medications on file    No orders of the defined types were placed in this encounter.    Gross side effects, risk and benefits, and alternatives of medications and treatment plan in general discussed with patient.  Patient is aware that all medications have potential side effects and we are unable to predict every side effect or drug-drug interaction that may occur.   Patient will call with any questions prior to using medication if they have concerns.  Expresses verbal understanding and consents to current therapy and treatment regimen.  No barriers to understanding were identified.  Red flag symptoms and signs  discussed in detail.  Patient expressed understanding regarding what to do in case of emergency\urgent symptoms  Please see AVS handed out to patient at the end of our visit for further patient instructions/ counseling done pertaining to today's office visit.   No Follow-up on file.     Note: This document was prepared using Dragon voice recognition software and may include unintentional dictation errors.  Mignonne Afonso 10:03 AM --------------------------------------------------------------------------------------------------------------------------------------------------------------------------------------------------------------------------------------------    Subjective:    CC:  Chief Complaint  Patient presents with  . Knee Pain    right knee pain x 3-4 months worse last 2-3 days    HPI: Ronald Kelley is a 76 y.o. male who presents to [place] [facility] today for issues as discussed below.  Patient presents today with acute on chronic right knee pain since May.  Now acutely worse past couple days.    However, patient presents today with heart rate over 150s and blood pressure unusually low at 82/55  and  97/66 when he typically runs in the 112-145\ 66-84  and pulses are normally in the range of 48-79.    Per patient last saw that time he saw his cardiologist Dr. Claris Pong was 2010. - Patient after further questioning endorses a history of approximately one week of feeling more fatigued with activity then usual.  Last evening after dinner he started feeling some chest discomfort left-sided, no radiation to the arm or jaw.  He had no  other symptoms of nausea, diaphoresis etc.  He wasn't doing anything in particular at the time.  Symptoms lasted 10 minutes to 30 minutes- comes and goes.  Not worsened by anything or improved with anything.   He was having several episodes of chest pain throughout the night as well and did not sleep well.   He tells me  he's also been more fatigued than usual for about 1 week or so.  Has not felt like being active and going out with friends like he usually does  - He has a history of asymptomatic bradycardia and history of atrial fibrillation.  However these episodes were brought on after he took Percocet and drinking too much coffee\caffeine prior.  Also these prior episodes he never had any chest pain or any other associated symptoms.    No problems updated.   Wt Readings from Last 3 Encounters:  07/31/17 146 lb 6.4 oz (66.4 kg)  07/27/17 149 lb (67.6 kg)  11/05/16 159 lb 9.6 oz (72.4 kg)   BP Readings from Last 3 Encounters:  07/31/17 (!) 82/55  07/27/17 127/66  11/05/16 127/72   BMI Readings from Last 3 Encounters:  07/31/17 22.93 kg/m  07/27/17 23.34 kg/m  11/05/16 25.00 kg/m     Patient Care Team    Relationship Specialty Notifications Start End  [name], DO PCP - General Family Medicine  08/28/16   Kathee Delton, MD Referring Physician Pulmonary Disease  09/24/16    Comment: Sleep medicine-  sees patient for his OSA  Kristeen Miss, MD Consulting Physician Neurosurgery  09/27/16   Milus Banister, MD Attending Physician Gastroenterology  09/27/16   Festus Aloe, MD Consulting Physician Urology  09/27/16      Patient Active Problem List   Diagnosis Date Noted  . Impaired fasting glucose 09/27/2016    Priority: High  . History of vitamin D deficiency 09/27/2016    Priority: High  . Diet-controlled diabetes mellitus ([place]) 09/24/2016    Priority: High  . H/O ATRIAL FIBRILLATION, PAROXYSMAL 09/28/2008    Priority: High  . h/o Hyperthyroidism 06/23/2008    Priority: High  . h/o Leukopenia 11/05/2016    Priority: Medium  . GERD (gastroesophageal reflux disease) 04/28/2013    Priority: Medium  . OSA on CPAP 05/13/2010    Priority: Medium  . h/o Osteopenia 07/30/2014    Priority: Low  . HLD 11/25/2016  . Sinusitis 11/05/2016  . Drug-induced low platelet  count 11/05/2016  . Degenerative disk disease 09/27/2016  . Presbycusis 09/27/2016  . BPH associated with nocturia 09/24/2016  . Annual physical exam 04/27/2013  . Erectile dysfunction 10/08/2011  . Elevated PSA/ nocturia sx 09/15/2011  . INGUINAL HERNIA, LEFT 06/23/2008    Past Medical history, Surgical history, Family history, Social history, Allergies and Medications have been entered into the medical record, reviewed and changed as needed.    Current Meds  Medication Sig  . cholecalciferol (VITAMIN D) 1000 units tablet Take 1,000 Units by mouth daily.  . Cholecalciferol (VITAMIN D3) 5000 units TABS 5,000 IU OTC vitamin D3 daily.  Marland Kitchen CINNAMON PO Take 1 tablet by mouth 2 (two) times daily.  Marland Kitchen ibuprofen (ADVIL,MOTRIN) 200 MG tablet Take 200 mg by mouth every 6 (six) hours as needed.    Allergies:  Allergies  Allergen Reactions  . Hydrocodone-Acetaminophen Other (See Comments)    REACTION: atrial fibrilation  . Oxycodone Other (See Comments)    Atrial fibrilation  . Oxycodone-Acetaminophen Other (See Comments)    REACTION:  atrial fibrilation   ok Darvocet     Review of Systems: General:   Denies fever, chills, unexplained weight loss.  Optho/Auditory:   Denies visual changes, blurred vision/LOV Respiratory:   Denies wheeze,  + DOE more than baseline levels.  Cardiovascular:   + chest pain, + palpitations, new onset peripheral edema  Gastrointestinal:   Denies nausea, vomiting, diarrhea, abd pain.  Genitourinary: Denies dysuria, freq/ urgency, flank pain or discharge from genitals.  Endocrine:     Denies hot or cold intolerance, polyuria, polydipsia. Musculoskeletal:   Denies unexplained myalgias, joint swelling, unexplained arthralgias, gait problems.  Skin:  Denies new onset rash, suspicious lesions Neurological:     Denies dizziness, unexplained weakness, numbness  Psychiatric/Behavioral:   Denies mood changes, suicidal or homicidal ideations,  hallucinations    Objective:   Blood pressure (!) 82/55, pulse (!) 118, height 5\' 7"  (1.702 m), weight 146 lb 6.4 oz (66.4 kg), SpO2 99 %. Body mass index is 22.93 kg/m. General:  Well Developed, well nourished, appropriate for stated age.  Neuro:  Alert and oriented,  extra-ocular muscles intact  HEENT:  Normocephalic, atraumatic, neck supple Skin:  no gross rash, warm, pink. Cardiac:  RRR, S1 S2 Respiratory:  ECTA B/L and A/P, Not using accessory muscles, speaking in full sentences- unlabored. Vascular:  Ext warm, no cyanosis apprec.; cap RF less 2 sec. Psych:  No HI/SI, judgement and insight good, Euthymic mood. Full Affect.

## 2017-07-31 NOTE — ED Triage Notes (Signed)
Pt reports hx of a-fib. Pt began having shortness of breath 2 nights ago and worsened last night. Pt also reports hypotension at home. Pt aoX4. Skin warm and dry.

## 2017-07-31 NOTE — ED Notes (Signed)
Patient has high energy and needs instruct to rest and not to talk during blood pressure checks.

## 2017-07-31 NOTE — ED Notes (Signed)
The pt remains in ultra sound

## 2017-07-31 NOTE — ED Notes (Signed)
Report given to christy rn

## 2017-07-31 NOTE — ED Provider Notes (Signed)
[facility] DEPT Provider Note   CSN: [id number] Arrival date & time: 07/31/17  1026     History   Chief Complaint Chief Complaint  Patient presents with  . Tachycardia    HPI RIP HAWES is a 76 y.o. male.  76 year old male presents with chest pain that began yesterday which is been lasting for 30 minutes which waxes and wanes and associated with dyspnea but no diaphoresis. He is also noted weakness times several days. Denies any palpitations. No syncope or near-syncope. Has a remote history of A. fib years ago but is not taking medications for this and was seen by cardiology and cleared. Denies any abdominal discomfort. No visual changes. Called EMS was found to be in A. fib with RVR and transported here.      Past Medical History:  Diagnosis Date  . Atrial fib/flutter, transient   . Degenerative disk disease   . Diabetes mellitus    a1c 6.5, 07/2008  . Hyperthyroidism    remote h/o , no ablation  . OSA on CPAP     Patient Active Problem List   Diagnosis Date Noted  . HLD 11/25/2016  . Sinusitis 11/05/2016  . h/o Leukopenia 11/05/2016  . Drug-induced low platelet count 11/05/2016  . Impaired fasting glucose 09/27/2016  . Degenerative disk disease 09/27/2016  . Presbycusis 09/27/2016  . History of vitamin D deficiency 09/27/2016  . Diet-controlled diabetes mellitus ([place]) 09/24/2016  . BPH associated with nocturia 09/24/2016  . h/o Osteopenia 07/30/2014  . GERD (gastroesophageal reflux disease) 04/28/2013  . Annual physical exam 04/27/2013  . Erectile dysfunction 10/08/2011  . Elevated PSA/ nocturia sx 09/15/2011  . OSA on CPAP 05/13/2010  . H/O ATRIAL FIBRILLATION, PAROXYSMAL 09/28/2008  . h/o Hyperthyroidism 06/23/2008  . INGUINAL HERNIA, LEFT 06/23/2008    Past Surgical History:  Procedure Laterality Date  . HERNIA REPAIR  2010  . 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 ROTATOR CUFF REPAIR          Home Medications    Prior to Admission medications   Medication Sig Start Date End Date Taking? Authorizing Provider  cholecalciferol (VITAMIN D) 1000 units tablet Take 1,000 Units by mouth daily.    [provider]  Cholecalciferol (VITAMIN D3) 5000 units TABS 5,000 IU OTC vitamin D3 daily. 11/05/16   Opalski, Deborah, DO  CINNAMON PO Take 1 tablet by mouth 2 (two) times daily.    [provider]  ibuprofen (ADVIL,MOTRIN) 200 MG tablet Take 200 mg by mouth every 6 (six) hours as needed.    [provider]    Family History Family History  Problem Relation Age of Onset  . Hypertension Mother   . Diabetes Brother   . Alzheimer's disease Father 76  . Coronary artery disease Neg Hx   . Stroke Neg Hx   . Colon cancer Neg Hx   . Prostate cancer Neg Hx     Social History Social History  Substance Use Topics  . Smoking status: Never Smoker  . Smokeless tobacco: Never Used  . Alcohol use No     Allergies   Hydrocodone-acetaminophen; Oxycodone; and Oxycodone-acetaminophen   Review of Systems Review of Systems  All other systems reviewed and are negative.    Physical Exam Updated Vital Signs BP 93/81 (BP Location: Left Arm)   Temp 98 F (36.7 C) (Oral)   Resp 16   SpO2 96%  Physical Exam  Constitutional: He is oriented to person, place, and time. He appears well-developed and well-nourished.  Non-toxic appearance. No distress.  HENT:  Head: Normocephalic and atraumatic.  Eyes: Pupils are equal, round, and reactive to light. Conjunctivae, EOM and lids are normal.  Neck: Normal range of motion. Neck supple. No tracheal deviation present. No thyroid mass present.  Cardiovascular: Normal heart sounds.  An irregularly irregular rhythm present. Tachycardia present.  Exam reveals no gallop.   No murmur heard. Pulmonary/Chest: Effort normal and breath sounds normal. No stridor. No respiratory distress. He has no decreased breath sounds. He  has no wheezes. He has no rhonchi. He has no rales.  Abdominal: Soft. Normal appearance and bowel sounds are normal. He exhibits no distension. There is no tenderness. There is no rebound and no CVA tenderness.  Musculoskeletal: Normal range of motion. He exhibits no edema or tenderness.  Neurological: He is alert and oriented to person, place, and time. He has normal strength. No cranial nerve deficit or sensory deficit. GCS eye subscore is 4. GCS verbal subscore is 5. GCS motor subscore is 6.  Skin: Skin is warm and dry. No abrasion and no rash noted.  Psychiatric: He has a normal mood and affect. His speech is normal and behavior is normal.  Nursing note and vitals reviewed.    ED Treatments / Results  Labs (all labs ordered are listed, but only abnormal results are displayed) Labs Reviewed  BASIC METABOLIC PANEL  CBC  TROPONIN I  PROTIME-INR  APTT    EKG  EKG Interpretation  Date/Time:  Friday July 31 2017 10:39:53 EDT Ventricular Rate:  148 PR Interval:    QRS Duration: 84 QT Interval:  274 QTC Calculation: 430 R Axis:   80 Text Interpretation:  Atrial fibrillation with rapid ventricular response Abnormal ECG No significant change since last tracing Confirmed by Lacretia Leigh ([id number]) on 07/31/2017 10:50:41 AM       Radiology No results found.  Procedures Procedures (including critical care time)  Medications Ordered in ED Medications  sodium chloride 0.9 % bolus 500 mL (not administered)  0.9 %  sodium chloride infusion (not administered)  diltiazem (CARDIZEM) 1 mg/mL load via infusion 10 mg (not administered)    And  diltiazem (CARDIZEM) 100 mg in dextrose 5 % 100 mL (1 mg/mL) infusion (not administered)     Initial Impression / Assessment and Plan / ED Course  I have reviewed the triage vital signs and the nursing notes.  Pertinent labs & imaging results that were available during my care of the patient were reviewed by me and considered in my medical  decision making (see chart for details).     Patient given fluid bolus and then IV Cardizem and then Cardizem drip. Patient's heart rate has decreased. TSH is decreased and concern for hyperthyroidism as cause of his symptoms. Patient is hemodynamically stable at this time. We'll consult hospice for admission  CRITICAL CARE Performed by: Leota Jacobsen Total critical care time: 60 minutes Critical care time was exclusive of separately billable procedures and treating other patients. Critical care was necessary to treat or prevent imminent or life-threatening deterioration. Critical care was time spent personally by me on the following activities: development of treatment plan with patient and/or surrogate as well as nursing, discussions with consultants, evaluation of patient's response to treatment, examination of patient, obtaining history from patient or surrogate, ordering and performing treatments and interventions, ordering and review of laboratory studies, ordering and review of radiographic  studies, pulse oximetry and re-evaluation of patient's condition.   Final Clinical Impressions(s) / ED Diagnoses   Final diagnoses:  None    New Prescriptions New Prescriptions   No medications on file     Lacretia Leigh, MD 07/31/17 1338

## 2017-07-31 NOTE — Patient Instructions (Signed)
To the emergency room-  AMA via private vehicle in care of wife.

## 2017-07-31 NOTE — H&P (Signed)
[facility] History and Physical  [name]:[id number] DOB: 1941/01/23 DOA: 07/31/2017  Referring physician:  PCP: [name], DO   Chief Complaint: "I felt her heart racing"  HPI: Ronald Kelley is a 76 y.o. male  with past medical history noted for hypothyroidism, sleep apnea, diabetes and abnormal heart rhythm presents to the hospital with racing heartbeat. Patient says he's had off and on chest completing filling the last week. Dorsalis recent medication changes. States he is not currently followed by cardiology. Had extreme shortness of breath with racing heart beat and called EMS. Patient brought to the emergency room for evaluation.  ED course: Patient found to be in A. fib with RVR and started on diltiazem which was affected.   Review of Systems:  As per HPI otherwise 10 point review of systems negative.    Past Medical History:  Diagnosis Date  . Atrial fib/flutter, transient   . Degenerative disk disease   . Diabetes mellitus    a1c 6.5, 07/2008  . Hyperthyroidism    remote h/o , no ablation  . OSA on CPAP    Past Surgical History:  Procedure Laterality Date  . HERNIA REPAIR  2010  . 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 ROTATOR CUFF REPAIR     Social History:  reports that he has never smoked. He has never used smokeless tobacco. He reports that he does not drink alcohol or use drugs.  Allergies  Allergen Reactions  . Hydrocodone-Acetaminophen Other (See Comments)    REACTION: atrial fibrilation  . Oxycodone Other (See Comments)    Atrial fibrilation  . Oxycodone-Acetaminophen Other (See Comments)    REACTION: atrial fibrilation   ok Darvocet    Family History  Problem Relation Age of Onset  . Hypertension Mother   . Diabetes Brother   . Alzheimer's disease Father 53  . Coronary artery disease Neg Hx   . Stroke Neg Hx   . Colon cancer Neg Hx   . Prostate cancer Neg Hx       Prior to Admission medications   Medication Sig Start Date End Date Taking? Authorizing Provider  cholecalciferol (VITAMIN D) 1000 units tablet Take 1,000 Units by mouth daily.   Yes [provider]  Cholecalciferol (VITAMIN D3) 5000 units TABS 5,000 IU OTC vitamin D3 daily. 11/05/16  Yes Opalski, Deborah, DO  CINNAMON PO Take 2 tablets by mouth daily.    Yes [provider]  ibuprofen (ADVIL,MOTRIN) 200 MG tablet Take 200 mg by mouth every 6 (six) hours as needed.   Yes [provider]  magnesium 30 MG tablet Take 30 mg by mouth daily.   Yes [provider]   Physical Exam: Vitals:   07/31/17 1745 07/31/17 1800 07/31/17 1815 07/31/17 1943  BP: (!) 103/53 (!) 112/54 (!) 114/50 (!) 119/55  Pulse: 62 64 79 60  Resp: 14 19 (!) 24 20  Temp:    98.9 F (37.2 C)  TempSrc:      SpO2: 97% 97% 98% 99%  Weight:    66.1 kg (145 lb 12.8 oz)  Height:    5\' 5"  (1.651 m)    Wt Readings from Last 3 Encounters:  07/31/17 66.1 kg (145 lb 12.8 oz)  07/31/17 66.4 kg (146 lb 6.4 oz)  07/27/17 67.6 kg (149 lb)    General:  Appears calm and comfortable; a&Ox3 Eyes:  PERRL, EOMI, normal  lids, iris ENT:  grossly normal hearing, lips & tongue Neck:  no LAD, masses or thyromegaly Cardiovascular:  RRR, no m/r/g. No LE edema.  Respiratory:  CTA bilaterally, no w/r/r. Normal respiratory effort. Abdomen:  soft, ntnd Skin:  no rash or induration seen on limited exam Musculoskeletal:  grossly normal tone BUE/BLE Psychiatric:  grossly normal mood and affect, speech fluent and appropriate Neurologic:  CN 2-12 grossly intact, moves all extremities in coordinated fashion.          Labs on Admission:  Basic Metabolic Panel:  Recent Labs Lab 07/31/17 1058 07/31/17 1830  NA 139  --   K 4.9  --   CL 106  --   CO2 24  --   GLUCOSE 142*  --   BUN 24*  --   CREATININE 1.26*  --   CALCIUM 12.1*  --   MG  --  1.3*  PHOS  --  1.9*   Liver Function Tests: No  results for input(s): AST, ALT, ALKPHOS, BILITOT, PROT, ALBUMIN in the last 168 hours. No results for input(s): LIPASE, AMYLASE in the last 168 hours. No results for input(s): AMMONIA in the last 168 hours. CBC:  Recent Labs Lab 07/31/17 1058  WBC 11.1*  HGB 14.8  HCT 44.6  MCV 90.3  PLT 222   Cardiac Enzymes:  Recent Labs Lab 07/31/17 1058 07/31/17 1830  TROPONINI <0.03 <0.03    BNP (last 3 results) No results for input(s): BNP in the last 8760 hours.  ProBNP (last 3 results) No results for input(s): PROBNP in the last 8760 hours.   Serum creatinine: 1.26 mg/dL (H) 07/31/17 1058 Estimated creatinine clearance: 42.7 mL/min (A)  CBG: No results for input(s): GLUCAP in the last 168 hours.  Radiological Exams on Admission: [place] Retroperitoneal (renal,aorta,ivc Nodes)  Result Date: 07/31/2017 CLINICAL DATA:  Abnormal renal ultrasound. Previous nephrolithiasis with right UPJ obstruction. EXAM: RENAL / URINARY TRACT ULTRASOUND COMPLETE COMPARISON:  None. FINDINGS: Right Kidney: Length: 11.8 cm, within normal limits. Echogenicity within normal limits. No mass or hydronephrosis visualized. No shadowing stones are evident. Left Kidney: Length: 12.5 cm, within normal limits. Echogenicity within normal limits. No mass or hydronephrosis visualized. Bladder: Appears normal for degree of bladder distention. The prostate gland is somewhat prominent, measuring up to 5.3 cm in maximal transverse dimension. IMPRESSION: 1. No evidence for nephrolithiasis or urinary tract obstruction. 2. Enlarged prostate gland. Electronically Signed   By: San Morelle M.D.   On: 07/31/2017 17:37   Dg Chest Port 1 View  Result Date: 07/31/2017 CLINICAL DATA:  Shortness of breath. EXAM: PORTABLE CHEST 1 VIEW COMPARISON:  Radiograph of July 22, 2009. FINDINGS: The heart size and mediastinal contours are within normal limits. Atherosclerosis of thoracic aorta is noted. Both lungs are clear. No  pneumothorax or pleural effusion is noted. Narrowing of right subacromial space is noted consistent with rotator cuff injury, as well as degenerative change involving right glenohumeral joint. IMPRESSION: No acute cardiopulmonary abnormality seen.  Aortic atherosclerosis. Electronically Signed   By: Marijo Conception, M.D.   On: 07/31/2017 12:02    EKG: Independently reviewed. Afib with RVR  Assessment/Plan Active Problems:   Atrial fibrillation with RVR (HCC)  1)afib with RVR - serial trop ordered, initial neg - prn EKG CP - prn moprhine CP - prn ntg cp - asa in ED and QD - echo ordered - tele bed, cardiac monitoring - zofran prn for nausea Dilt drip if needed  Hypertension When necessary hydralazine  10 mg IV as needed for severe blood pressure  Low TSH, hx of hyperthyroid Check T3 & T4  Hx of DM Check A1c SSI AC May poss be stopped based on A1c    Code Status: FULL DVT Prophylaxis: SCDs Family Communication: wife at bedside Disposition Plan: Pending Improvement  Status: inpt tele  Elwin Mocha, MD Family Medicine [facility] [email or web address] Password [email or web address]

## 2017-07-31 NOTE — ED Notes (Signed)
Nurse unable to take report code on the floor

## 2017-07-31 NOTE — ED Notes (Signed)
Report attempted  Unsuccessful  She will call abck

## 2017-07-31 NOTE — ED Notes (Signed)
Diet ordered 

## 2017-07-31 NOTE — ED Notes (Signed)
No pain   Iv infusing at specified rate

## 2017-07-31 NOTE — ED Notes (Signed)
pts wife upset that the pt is not upstairs yet

## 2017-07-31 NOTE — ED Notes (Signed)
Admitting doctor at the bedside 

## 2017-08-01 DIAGNOSIS — E0590 Thyrotoxicosis, unspecified without thyrotoxic crisis or storm: Secondary | ICD-10-CM | POA: Diagnosis not present

## 2017-08-01 DIAGNOSIS — E119 Type 2 diabetes mellitus without complications: Secondary | ICD-10-CM | POA: Diagnosis not present

## 2017-08-01 DIAGNOSIS — R946 Abnormal results of thyroid function studies: Secondary | ICD-10-CM

## 2017-08-01 DIAGNOSIS — R7989 Other specified abnormal findings of blood chemistry: Secondary | ICD-10-CM | POA: Diagnosis present

## 2017-08-01 DIAGNOSIS — I4891 Unspecified atrial fibrillation: Secondary | ICD-10-CM | POA: Diagnosis not present

## 2017-08-01 LAB — CBC
HCT: 38.1 % — ABNORMAL LOW (ref 39.0–52.0)
Hemoglobin: 12.2 g/dL — ABNORMAL LOW (ref 13.0–17.0)
MCH: 28.6 pg (ref 26.0–34.0)
MCHC: 32 g/dL (ref 30.0–36.0)
MCV: 89.4 fL (ref 78.0–100.0)
PLATELETS: 207 10*3/uL (ref 150–400)
RBC: 4.26 MIL/uL (ref 4.22–5.81)
RDW: 12.8 % (ref 11.5–15.5)
WBC: 10.9 10*3/uL — AB (ref 4.0–10.5)

## 2017-08-01 LAB — BASIC METABOLIC PANEL
Anion gap: 6 (ref 5–15)
BUN: 21 mg/dL — AB (ref 6–20)
CHLORIDE: 108 mmol/L (ref 101–111)
CO2: 26 mmol/L (ref 22–32)
Calcium: 10.1 mg/dL (ref 8.9–10.3)
Creatinine, Ser: 0.82 mg/dL (ref 0.61–1.24)
GFR calc Af Amer: >60 mL/min (ref >60)
GFR calc non Af Amer: >60 mL/min (ref >60)
Glucose, Bld: 121 mg/dL — ABNORMAL HIGH (ref 65–99)
Potassium: 3.8 mmol/L (ref 3.5–5.1)
SODIUM: 140 mmol/L (ref 135–145)

## 2017-08-01 LAB — GLUCOSE, CAPILLARY
GLUCOSE-CAPILLARY: 113 mg/dL — AB (ref 65–99)
Glucose-Capillary: 115 mg/dL — ABNORMAL HIGH (ref 65–99)

## 2017-08-01 LAB — TROPONIN I

## 2017-08-01 LAB — HEMOGLOBIN A1C
Hgb A1c MFr Bld: 6.6 % — ABNORMAL HIGH (ref 4.8–5.6)
Mean Plasma Glucose: 142.72 mg/dL

## 2017-08-01 NOTE — Progress Notes (Signed)
Pt discharged to home, condition stable, education complete, verbalized understanding of all follow up appointments.  Edward Qualia RN

## 2017-08-01 NOTE — Discharge Instructions (Signed)
Follow with Primary MD  [name], DO  and other consultant's as instructed your Hospitalist MD  Please get a complete blood count and chemistry panel checked by your Primary MD at your next visit, and again as instructed by your Primary MD.  Get Medicines reviewed and adjusted: Please take all your medications with you for your next visit with your Primary MD  Laboratory/radiological data: Please request your Primary MD to go over all hospital tests and procedure/radiological results at the follow up, please ask your Primary MD to get all Hospital records sent to his/her office.  In some cases, they will be blood work, cultures and biopsy results pending at the time of your discharge. Please request that your primary care M.D. follows up on these results.  Also Note the following: If you experience worsening of your admission symptoms, develop shortness of breath, life threatening emergency, suicidal or homicidal thoughts you must seek medical attention immediately by calling 911 or calling your MD immediately  if symptoms less severe.  You must read complete instructions/literature along with all the possible adverse reactions/side effects for all the Medicines you take and that have been prescribed to you. Take any new Medicines after you have completely understood and accpet all the possible adverse reactions/side effects.   Do not drive when taking Pain medications or sleeping medications (Benzodaizepines)  Do not take more than prescribed Pain, Sleep and Anxiety Medications. It is not advisable to combine anxiety,sleep and pain medications without talking with your primary care practitioner  Special Instructions: If you have smoked or chewed Tobacco  in the last 2 yrs please stop smoking, stop any regular Alcohol  and or any Recreational drug use.  Wear Seat belts while driving.  Please note: You were cared for by a hospitalist during your hospital stay. Once you are  discharged, your primary care physician will handle any further medical issues. Please note that NO REFILLS for any discharge medications will be authorized once you are discharged, as it is imperative that you return to your primary care physician (or establish a relationship with a primary care physician if you do not have one) for your post hospital discharge needs so that they can reassess your need for medications and monitor your lab values.

## 2017-08-01 NOTE — Progress Notes (Addendum)
Pt now in NSR. Still on Cardizem gtt. Notified on call MD. Cardizem d/c. Continue to monitor rate and rythem.

## 2017-08-01 NOTE — Discharge Summary (Signed)
Physician Discharge Summary  Yazen Rosko Gandolfo CVE:[id number] DOB: November 05, 1941 DOA: 07/31/2017  PCP: [name], DO Cardiologist: Dr. Clearnce Hasten  Admit date: 07/31/2017 Discharge date: 08/01/2017  Admitted From: HOME  Disposition: HOME   Recommendations for Outpatient Follow-up:  1. Follow up with PCP in 3-5 days for recheck 2. Please establish care with endocrinologist for treatment and eval of hyperthyroidism 3. Please obtain BMP/Magnesium in one week 4. Please follow up with cardiologist in 1-2 weeks for eval of atrial fibrillation  Discharge Condition: STABLE   CODE STATUS: FULL    Brief Hospitalization Summary: Please see all hospital notes, images, labs for full details of the hospitalization. HPI: Ronald Kelley is a 76 y.o. male  with past medical history noted for hypothyroidism, sleep apnea, diabetes and abnormal heart rhythm presents to the hospital with racing heartbeat. Patient says he's had off and on chest completing filling the last week. Dors[place] recent medication changes. States he is not currently followed by cardiology. Had extreme shortness of breath with racing heart beat and called EMS. Patient brought to the emergency room for evaluation.  ED course: Patient found to be in A. fib with RVR and started on diltiazem which was very effective, he converted to NSR and rapidly taken off the diltiazem infusion.   Assessment/Plan Active Problems:   Atrial fibrillation with RVR (HCC)  1)afib with RVR - Resolved now.   - serial troponin negative x 3. - Pt rapidly taken off diltiazem and converted to NSR.  HR remained stable in 50-60 range.  I had a long discussion with patient about the treatment and risks of atrial fibrillation and he declines anticoagulation and declines to take medications for rate control.  He says he had already been advised about this by his cardiologist Dr. Rayann Heman and decided not to take the medications.  Again he had declined and verbalized  understanding of the risks.  He was noted to be hyperthyroid.  He declined to take medication for this but said he would follow up with an endocrinologist to seek further evaluation and management for this.  I explained to patient that the Afib RVR could return at any time, he verbalized understanding.  I explained stroke risks associated with Afib and he verbalized understanding.  He wants to discuss with his PCP.  I asked him to please consider going back to see his cardiologist Dr. Rayann Heman and he was willing to do that.   Hypertension Pt declined to take any medications for this but said he would follow up and discuss with PCP.    Hyperthyroidism - Pt declined treatment but said he wanted to discuss with his PCP.  He said he would follow up with endocrinologist.  I recommended Dagmar Hait MD.  He will see his PCP about this.  No nodules palpated on exam.    Type 2 Diabetes - Diet controlled per patient.  Not interested in medication treatment.    Hypomagnesemia - Pt declined treatment in hospital, says he will take his oral supplements that he has at home.   Code Status: FULL DVT Prophylaxis: SCDs Family Communication: wife at bedside Disposition Plan: Home   Discharge Diagnoses:  Active Problems:   h/o Hyperthyroidism   H/O ATRIAL FIBRILLATION, PAROXYSMAL   Diet-controlled diabetes mellitus (HCC)   Atrial fibrillation with RVR (HCC)   Elevated serum free T4 level   Low TSH level  Discharge Instructions: Discharge Instructions    Call MD for:  difficulty breathing, headache or visual disturbances  Complete by:  As directed    Call MD for:  extreme fatigue    Complete by:  As directed    Call MD for:  hives    Complete by:  As directed    Call MD for:  persistant dizziness or light-headedness    Complete by:  As directed    Call MD for:  persistant nausea and vomiting    Complete by:  As directed    Call MD for:  severe uncontrolled pain    Complete by:  As directed     Call MD for:  temperature >100.4    Complete by:  As directed    Diet - low sodium heart healthy    Complete by:  As directed    Increase activity slowly    Complete by:  As directed      Allergies as of 08/01/2017      Reactions   Hydrocodone-acetaminophen Other (See Comments)   REACTION: atrial fibrilation   Oxycodone Other (See Comments)   Atrial fibrilation   Oxycodone-acetaminophen Other (See Comments)   REACTION: atrial fibrilation   ok Darvocet      Medication List    TAKE these medications   cholecalciferol 1000 units tablet Commonly known as:  VITAMIN D Take 1,000 Units by mouth daily.   Vitamin D3 5000 units Tabs 5,000 IU OTC vitamin D3 daily.   CINNAMON PO Take 2 tablets by mouth daily.   ibuprofen 200 MG tablet Commonly known as:  ADVIL,MOTRIN Take 200 mg by mouth every 6 (six) hours as needed.   magnesium 30 MG tablet Take 30 mg by mouth daily.            Discharge Care Instructions        Start     Ordered   08/01/17 0000  Increase activity slowly     08/01/17 1123   08/01/17 0000  Diet - low sodium heart healthy     08/01/17 1123   08/01/17 0000  Call MD for:  temperature >100.4     08/01/17 1123   08/01/17 0000  Call MD for:  persistant nausea and vomiting     08/01/17 1123   08/01/17 0000  Call MD for:  severe uncontrolled pain     08/01/17 1123   08/01/17 0000  Call MD for:  difficulty breathing, headache or visual disturbances     08/01/17 1123   08/01/17 0000  Call MD for:  hives     08/01/17 1123   08/01/17 0000  Call MD for:  persistant dizziness or light-headedness     08/01/17 1123   08/01/17 0000  Call MD for:  extreme fatigue     08/01/17 1123     Follow-up Information    Opalski, Neoma Laming, DO. Schedule an appointment as soon as possible for a visit in 4 day(s).   Specialty:  Family Medicine Why:  Hospital Follow Up  Contact information: [place] [address] [phone]        Delrae Rend,  MD. Schedule an appointment as soon as possible for a visit in 1 week(s).   Specialty:  Endocrinology Why:  Establish Care Hyperthyroidism  Contact information: [address] [place] [address] [phone]        Thompson Grayer, MD. Schedule an appointment as soon as possible for a visit in 2 week(s).   Specialty:  Cardiology Why:  Hospital Follow Up Contact information: [address]  [address] [place] [address] [phone]          Allergies  Allergen Reactions  . Hydrocodone-Acetaminophen Other (See Comments)    REACTION: atrial fibrilation  . Oxycodone Other (See Comments)    Atrial fibrilation  . Oxycodone-Acetaminophen Other (See Comments)    REACTION: atrial fibrilation   ok Darvocet   Current Discharge Medication List    CONTINUE these medications which have NOT CHANGED   Details  !! cholecalciferol (VITAMIN D) 1000 units tablet Take 1,000 Units by mouth daily.    !! Cholecalciferol (VITAMIN D3) 5000 units TABS 5,000 IU OTC vitamin D3 daily. Qty: 90 tablet, Refills: 3    CINNAMON PO Take 2 tablets by mouth daily.     ibuprofen (ADVIL,MOTRIN) 200 MG tablet Take 200 mg by mouth every 6 (six) hours as needed.    magnesium 30 MG tablet Take 30 mg by mouth daily.     !! - Potential duplicate medications found. Please discuss with provider.     Procedures/Studies: [place] Retroperitoneal (renal,aorta,ivc Nodes)  Result Date: 07/31/2017 CLINICAL DATA:  Abnormal renal ultrasound. Previous nephrolithiasis with right UPJ obstruction. EXAM: RENAL / URINARY TRACT ULTRASOUND COMPLETE COMPARISON:  None. FINDINGS: Right Kidney: Length: 11.8 cm, within normal limits. Echogenicity within normal limits. No mass or hydronephrosis visualized. No shadowing stones are evident. Left Kidney: Length: 12.5 cm, within normal limits. Echogenicity within normal limits. No mass or hydronephrosis visualized. Bladder: Appears normal for degree of bladder distention.  The prostate gland is somewhat prominent, measuring up to 5.3 cm in maximal transverse dimension. IMPRESSION: 1. No evidence for nephrolithiasis or urinary tract obstruction. 2. Enlarged prostate gland. Electronically Signed   By: San Morelle M.D.   On: 07/31/2017 17:37   Dg Chest Port 1 View  Result Date: 07/31/2017 CLINICAL DATA:  Shortness of breath. EXAM: PORTABLE CHEST 1 VIEW COMPARISON:  Radiograph of July 22, 2009. FINDINGS: The heart size and mediastinal contours are within normal limits. Atherosclerosis of thoracic aorta is noted. Both lungs are clear. No pneumothorax or pleural effusion is noted. Narrowing of right subacromial space is noted consistent with rotator cuff injury, as well as degenerative change involving right glenohumeral joint. IMPRESSION: No acute cardiopulmonary abnormality seen.  Aortic atherosclerosis. Electronically Signed   By: Marijo Conception, M.D.   On: 07/31/2017 12:02      Subjective: Pt says he feels better and he really wants to go home.  He says that he isn't going to take any medications until he follows up with his PCP.    Discharge Exam: Vitals:   07/31/17 1943 08/01/17 0502  BP: (!) 119/55 113/62  Pulse: 60 63  Resp: 20 17  Temp: 98.9 F (37.2 C) 98.8 F (37.1 C)  SpO2: 99% 100%   Vitals:   07/31/17 1800 07/31/17 1815 07/31/17 1943 08/01/17 0502  BP: (!) 112/54 (!) 114/50 (!) 119/55 113/62  Pulse: 64 79 60 63  Resp: 19 (!) 24 20 17   Temp:   98.9 F (37.2 C) 98.8 F (37.1 C)  TempSrc:      SpO2: 97% 98% 99% 100%  Weight:   66.1 kg (145 lb 12.8 oz) 66.2 kg (146 lb)  Height:   5\' 5"  (1.651 m)    General: Pt is alert, awake, not in acute distress Neck: supple, no thyroid nodules palpated.  Cardiovascular: RRR, S1/S2 +, no rubs, no gallops Respiratory: CTA bilaterally, no wheezing, no rhonchi Abdominal: Soft, NT, ND, bowel sounds + Extremities: no edema, no  cyanosis   The results of significant diagnostics from this  hospitalization (including imaging, microbiology, ancillary and laboratory) are listed below for reference.     Microbiology: No results found for this or any previous visit (from the past 240 hour(s)).   Labs: BNP (last 3 results) No results for input(s): BNP in the last 8760 hours. Basic Metabolic Panel:  Recent Labs Lab 07/31/17 1058 07/31/17 1830 08/01/17 0244  NA 139  --  140  K 4.9  --  3.8  CL 106  --  108  CO2 24  --  26  GLUCOSE 142*  --  121*  BUN 24*  --  21*  CREATININE 1.26*  --  0.82  CALCIUM 12.1*  --  10.1  MG  --  1.3*  --   PHOS  --  1.9*  --    Liver Function Tests: No results for input(s): AST, ALT, ALKPHOS, BILITOT, PROT, ALBUMIN in the last 168 hours. No results for input(s): LIPASE, AMYLASE in the last 168 hours. No results for input(s): AMMONIA in the last 168 hours. CBC:  Recent Labs Lab 07/31/17 1058 08/01/17 0244  WBC 11.1* 10.9*  HGB 14.8 12.2*  HCT 44.6 38.1*  MCV 90.3 89.4  PLT 222 207   Cardiac Enzymes:  Recent Labs Lab 07/31/17 1058 07/31/17 1830 07/31/17 2048 08/01/17 0244  TROPONINI <0.03 <0.03 <0.03 <0.03   BNP: Invalid input(s): POCBNP CBG:  Recent Labs Lab 08/01/17 0732 08/01/17 1122  GLUCAP 113* 115*   D-Dimer No results for input(s): DDIMER in the last 72 hours. Hgb A1c  Recent Labs  08/01/17 0244  HGBA1C 6.6*   Lipid Profile No results for input(s): CHOL, HDL, LDLCALC, TRIG, CHOLHDL, LDLDIRECT in the last 72 hours. Thyroid function studies  Recent Labs  07/31/17 1133  TSH 0.012*   Anemia work up No results for input(s): VITAMINB12, FOLATE, FERRITIN, TIBC, IRON, RETICCTPCT in the last 72 hours. Urinalysis    Component Value Date/Time   COLORURINE YELLOW 07/31/2017 1426   APPEARANCEUR HAZY (A) 07/31/2017 1426   LABSPEC 1.018 07/31/2017 1426   PHURINE 6.0 07/31/2017 1426   GLUCOSEU NEGATIVE 07/31/2017 1426   HGBUR MODERATE (A) 07/31/2017 1426   HGBUR negative 12/31/2009 1053   BILIRUBINUR  NEGATIVE 07/31/2017 1426   BILIRUBINUR negative 10/29/2016 0819   KETONESUR 5 (A) 07/31/2017 1426   PROTEINUR NEGATIVE 07/31/2017 1426   UROBILINOGEN 0.2 10/29/2016 0819   UROBILINOGEN 1.0 09/05/2012 1512   NITRITE NEGATIVE 07/31/2017 1426   LEUKOCYTESUR NEGATIVE 07/31/2017 1426   Sepsis Labs Invalid input(s): PROCALCITONIN,  WBC,  LACTICIDVEN Microbiology No results found for this or any previous visit (from the past 240 hour(s)).  Time coordinating discharge:   SIGNED:  Irwin Brakeman, MD  [facility] 08/01/2017, 11:24 AM Pager [phone]  If 7PM-7AM, please contact night-coverage [email or web address] Password [email or web address]

## 2017-08-02 LAB — URINE CULTURE

## 2017-08-03 LAB — T3, FREE: T3 FREE: 3.8 pg/mL (ref 2.0–4.4)

## 2017-08-06 ENCOUNTER — Ambulatory Visit: Payer: Medicare Other | Admitting: Family Medicine

## 2017-08-06 ENCOUNTER — Encounter: Payer: Self-pay | Admitting: Family Medicine

## 2017-08-06 VITALS — BP 130/70 | HR 77 | Ht 67.0 in | Wt 149.3 lb

## 2017-08-06 DIAGNOSIS — E860 Dehydration: Secondary | ICD-10-CM

## 2017-08-06 DIAGNOSIS — I480 Paroxysmal atrial fibrillation: Secondary | ICD-10-CM | POA: Diagnosis not present

## 2017-08-06 DIAGNOSIS — E119 Type 2 diabetes mellitus without complications: Secondary | ICD-10-CM | POA: Diagnosis not present

## 2017-08-06 DIAGNOSIS — R7989 Other specified abnormal findings of blood chemistry: Secondary | ICD-10-CM

## 2017-08-06 DIAGNOSIS — R946 Abnormal results of thyroid function studies: Secondary | ICD-10-CM

## 2017-08-06 DIAGNOSIS — E0590 Thyrotoxicosis, unspecified without thyrotoxic crisis or storm: Secondary | ICD-10-CM

## 2017-08-06 DIAGNOSIS — I4891 Unspecified atrial fibrillation: Secondary | ICD-10-CM

## 2017-08-06 DIAGNOSIS — Z8639 Personal history of other endocrine, nutritional and metabolic disease: Secondary | ICD-10-CM

## 2017-08-06 DIAGNOSIS — E785 Hyperlipidemia, unspecified: Secondary | ICD-10-CM

## 2017-08-06 NOTE — Patient Instructions (Signed)
Discussed with patient he will drink at least 5 bottles of the 16.9 ounces of water per day or the equivalent of that.  - He will call Dr. [name] office to make that appointment since they are recontacted him.  He will let me know if he needs a new referral from me.  - We'll recheck BMP and magnesium today since his mag was low at 1.3 in the hospital.  BUN was elevated as 3 thoughts prior since he had some dizziness and some orthostatic dizziness-he will try to drink more water.  - He will let me know if h e changes his mind regarding following up with Dr. Rayann Heman in the near future.  He declines a gaited SPECT myocardial view study \\exercise  echo test to be ordered.

## 2017-08-06 NOTE — Progress Notes (Signed)
Hospital f/up OV  Impression and Recommendations:    1. Hyperthyroidism   2. Elevated serum free T4 level   3. Dehydration, mild   4. Hypomagnesemia   5. Paroxysmal atrial fibrillation (HCC)   6. History of vitamin D deficiency   7. Hyperlipidemia, unspecified hyperlipidemia type   8. Atrial fibrillation with RVR ([place])   9. Diet-controlled diabetes mellitus ([place])     Discussed with patient he will drink at least 5 bottles of the 16.9 ounces of water per day or the equivalent of that.  - He will call Dr. [name] office to make that appointment since they are recontacted him.  He will let me know if he needs a new referral from me.  - We'll recheck BMP and magnesium today since his mag was low at 1.3 in the hospital.  BUN was elevated as 3 thoughts prior since he had some dizziness and some orthostatic dizziness-he will try to drink more water.  - He will let me know if he changes his mind regarding following up with Dr. Rayann Heman in the near future.  He declines a gaited SPECT myocardial view study\exercise echo today.       No problem-specific Assessment & Plan notes found for this encounter.   The patient was counseled, risk factors were discussed, anticipatory guidance given.   New Prescriptions   No medications on file     Discontinued Medications   No medications on file     Modified Medications   No medications on file      Orders Placed This Encounter  Procedures  . Magnesium  . Basic metabolic panel  . Hemoglobin A1c     Gross side effects, risk and benefits, and alternatives of medications and treatment plan in general discussed with patient.  Patient is aware that all medications have potential side effects and we are unable to predict every side effect or drug-drug interaction that may occur.   Patient will call with any questions prior to using medication if they have concerns.  Expresses verbal understanding and consents to current therapy and  treatment regimen.  No barriers to understanding were identified.  Red flag symptoms and signs discussed in detail.  Patient expressed understanding regarding what to do in case of emergency\urgent symptoms  Please see AVS handed out to patient at the end of our visit for further patient instructions/ counseling done pertaining to today's office visit.   Return in about 3 months (around 11/05/2017) for 30mo for FBW and Medicare wellness exam- come fasting.     Note: This document was prepared using Dragon voice recognition software and may include unintentional dictation errors.  [name] 10:49 AM --------------------------------------------------------------------------------------------------------------------------------------------------------------------------------------------------------------------------------------------    Subjective:    CC:  Chief Complaint  Patient presents with  . Follow-up    HPI: Ronald Kelley is a 76 y.o. male who presents to [place] [facility] today for issues as discussed below.  Last saw patient on 8\31\18.  At that time he presented in A. fib with RVR.  He was transferred to the ED at that time.  He was hospitalized overnight with serial negative troponins 3.   Patient was found to be started on diltiazem which was effective and converted to normal sinus rhythm and rapidly taken off his diltiazem infusion.  Found to have low TSH and high T4--> 1 year ago in Nov 2017 was N.   Problem  Hypomagnesemia  Dehydration, Mild  Wt Readings from Last 3 Encounters:  08/06/17 149 lb 4.8 oz (67.7 kg)  08/01/17 146 lb (66.2 kg)  07/31/17 146 lb 6.4 oz (66.4 kg)   BP Readings from Last 3 Encounters:  08/06/17 130/70  08/01/17 113/62  07/31/17 (!) 82/55   Pulse Readings from Last 3 Encounters:  08/06/17 77  08/01/17 63  07/31/17 (!) 118   BMI Readings from Last 3 Encounters:  08/06/17 23.38 kg/m  08/01/17 24.30  kg/m  07/31/17 22.93 kg/m     Patient Care Team    Relationship Specialty Notifications Start End  [name], DO PCP - General Family Medicine  08/28/16   Kathee Delton, MD Referring Physician Pulmonary Disease  09/24/16    Comment: Sleep medicine-  sees patient for his OSA  Kristeen Miss, MD Consulting Physician Neurosurgery  09/27/16   Milus Banister, MD Attending Physician Gastroenterology  09/27/16   Festus Aloe, MD Consulting Physician Urology  09/27/16      Patient Active Problem List   Diagnosis Date Noted  . Impaired fasting glucose 09/27/2016    Priority: High  . History of vitamin D deficiency 09/27/2016    Priority: High  . Diet-controlled diabetes mellitus ([place]) 09/24/2016    Priority: High  . H/O ATRIAL FIBRILLATION, PAROXYSMAL 09/28/2008    Priority: High  . h/o Hyperthyroidism 06/23/2008    Priority: High  . h/o Leukopenia 11/05/2016    Priority: Medium  . GERD (gastroesophageal reflux disease) 04/28/2013    Priority: Medium  . OSA on CPAP 05/13/2010    Priority: Medium  . h/o Osteopenia 07/30/2014    Priority: Low  . Hypomagnesemia 08/06/2017  . Dehydration, mild 08/06/2017  . Elevated serum free T4 level 08/01/2017  . Low TSH level 08/01/2017  . Atrial fibrillation with RVR ([place]) 07/31/2017  . HLD 11/25/2016  . Sinusitis 11/05/2016  . Drug-induced low platelet count 11/05/2016  . Degenerative disk disease 09/27/2016  . Presbycusis 09/27/2016  . BPH associated with nocturia 09/24/2016  . Annual physical exam 04/27/2013  . Erectile dysfunction 10/08/2011  . Elevated PSA/ nocturia sx 09/15/2011  . INGUINAL HERNIA, LEFT 06/23/2008    Past Medical history, Surgical history, Family history, Social history, Allergies and Medications have been entered into the medical record, reviewed and changed as needed.    Current Meds  Medication Sig  . cholecalciferol (VITAMIN D) 1000 units tablet Take 1,000 Units by mouth daily.  .  Cholecalciferol (VITAMIN D3) 5000 units TABS 5,000 IU OTC vitamin D3 daily.  Marland Kitchen CINNAMON PO Take 2 tablets by mouth daily.   Marland Kitchen ibuprofen (ADVIL,MOTRIN) 200 MG tablet Take 200 mg by mouth every 6 (six) hours as needed.  . magnesium 30 MG tablet Take 30 mg by mouth daily.    Allergies:  Allergies  Allergen Reactions  . Hydrocodone-Acetaminophen Other (See Comments)    REACTION: atrial fibrilation  . Oxycodone Other (See Comments)    Atrial fibrilation  . Oxycodone-Acetaminophen Other (See Comments)    REACTION: atrial fibrilation   ok Darvocet     Review of Systems: General:   Denies fever, chills, unexplained weight loss.  Optho/Auditory:   Denies visual changes, blurred vision/LOV Respiratory:   Denies wheeze, DOE more than baseline levels.  Cardiovascular:   Denies chest pain, palpitations, new onset peripheral edema  Gastrointestinal:   Denies nausea, vomiting, diarrhea, abd pain.  Genitourinary: Denies dysuria, freq/ urgency, flank pain or discharge from genitals.  Endocrine:     Denies hot or cold intolerance,  polyuria, polydipsia. Musculoskeletal:   Denies unexplained myalgias, joint swelling, unexplained arthralgias, gait problems.  Skin:  Denies new onset rash, suspicious lesions Neurological:     Denies dizziness, unexplained weakness, numbness  Psychiatric/Behavioral:   Denies mood changes, suicidal or homicidal ideations, hallucinations    Objective:   Blood pressure 130/70, pulse 77, height 5\' 7"  (1.702 m), weight 149 lb 4.8 oz (67.7 kg). Body mass index is 23.38 kg/m. General:  Well Developed, well nourished, appropriate for stated age.  Neuro:  Alert and oriented,  extra-ocular muscles intact  HEENT:  Normocephalic, atraumatic, neck supple, no carotid bruits appreciated  Skin:  no gross rash, warm, pink. Cardiac:  RRR, S1 S2 Respiratory:  ECTA B/L and A/P, Not using accessory muscles, speaking in full sentences- unlabored. Vascular:  Ext warm, no cyanosis  apprec.; cap RF less 2 sec. Psych:  No HI/SI, judgement and insight good, Euthymic mood. Full Affect.

## 2017-08-07 DIAGNOSIS — R739 Hyperglycemia, unspecified: Secondary | ICD-10-CM | POA: Diagnosis not present

## 2017-08-07 DIAGNOSIS — E0590 Thyrotoxicosis, unspecified without thyrotoxic crisis or storm: Secondary | ICD-10-CM | POA: Diagnosis not present

## 2017-08-07 DIAGNOSIS — I4891 Unspecified atrial fibrillation: Secondary | ICD-10-CM | POA: Diagnosis not present

## 2017-08-07 LAB — BASIC METABOLIC PANEL
BUN/Creatinine Ratio: 21 (ref 10–24)
BUN: 19 mg/dL (ref 8–27)
CALCIUM: 11.7 mg/dL — AB (ref 8.6–10.2)
CHLORIDE: 103 mmol/L (ref 96–106)
CO2: 25 mmol/L (ref 20–29)
Creatinine, Ser: 0.9 mg/dL (ref 0.76–1.27)
GFR calc Af Amer: 95 mL/min/{1.73_m2} (ref >59)
GFR, EST NON AFRICAN AMERICAN: 82 mL/min/{1.73_m2} (ref >59)
GLUCOSE: 118 mg/dL — AB (ref 65–99)
POTASSIUM: 4.5 mmol/L (ref 3.5–5.2)
SODIUM: 142 mmol/L (ref 134–144)

## 2017-08-07 LAB — HEMOGLOBIN A1C
ESTIMATED AVERAGE GLUCOSE: 143 mg/dL
Hgb A1c MFr Bld: 6.6 % — ABNORMAL HIGH (ref 4.8–5.6)

## 2017-08-07 LAB — MAGNESIUM: MAGNESIUM: 1.5 mg/dL — AB (ref 1.6–2.3)

## 2017-08-11 ENCOUNTER — Other Ambulatory Visit: Payer: Self-pay | Admitting: Family Medicine

## 2017-08-11 MED ORDER — MAGNESIUM 30 MG PO TABS: 30.0000 mg | ORAL_TABLET | Freq: Two times a day (BID) | ORAL | 2 refills | Status: DC

## 2017-08-17 ENCOUNTER — Other Ambulatory Visit (HOSPITAL_COMMUNITY): Payer: Self-pay | Admitting: Internal Medicine

## 2017-08-17 DIAGNOSIS — E0590 Thyrotoxicosis, unspecified without thyrotoxic crisis or storm: Secondary | ICD-10-CM

## 2017-09-07 ENCOUNTER — Ambulatory Visit (HOSPITAL_COMMUNITY)
Admission: RE | Admit: 2017-09-07 | Discharge: 2017-09-07 | Disposition: A | Discharge status: Home / Self Care | Payer: Medicare Other | Source: Ambulatory Visit | Attending: Internal Medicine | Admitting: Internal Medicine

## 2017-09-07 DIAGNOSIS — E0590 Thyrotoxicosis, unspecified without thyrotoxic crisis or storm: Secondary | ICD-10-CM | POA: Diagnosis not present

## 2017-09-07 DIAGNOSIS — R739 Hyperglycemia, unspecified: Secondary | ICD-10-CM | POA: Diagnosis not present

## 2017-09-07 MED ORDER — SODIUM IODIDE I 131 CAPSULE
8.0000 | Freq: Once | INTRAVENOUS | Status: AC | PRN
  Administered 2017-09-07: 8 via ORAL

## 2017-09-08 ENCOUNTER — Ambulatory Visit (HOSPITAL_COMMUNITY)
Admission: RE | Admit: 2017-09-08 | Discharge: 2017-09-08 | Disposition: A | Discharge status: Home / Self Care | Payer: Medicare Other | Source: Ambulatory Visit | Attending: Internal Medicine | Admitting: Internal Medicine

## 2017-09-08 DIAGNOSIS — R946 Abnormal results of thyroid function studies: Secondary | ICD-10-CM | POA: Diagnosis not present

## 2017-09-08 DIAGNOSIS — E0590 Thyrotoxicosis, unspecified without thyrotoxic crisis or storm: Secondary | ICD-10-CM | POA: Diagnosis not present

## 2017-09-08 MED ORDER — SODIUM PERTECHNETATE TC 99M INJECTION
10.0000 | Freq: Once | INTRAVENOUS | Status: AC | PRN
  Administered 2017-09-08: 10 via INTRAVENOUS

## 2017-10-08 DIAGNOSIS — E069 Thyroiditis, unspecified: Secondary | ICD-10-CM | POA: Diagnosis not present

## 2017-10-08 DIAGNOSIS — H8110 Benign paroxysmal vertigo, unspecified ear: Secondary | ICD-10-CM | POA: Diagnosis not present

## 2017-10-08 DIAGNOSIS — E039 Hypothyroidism, unspecified: Secondary | ICD-10-CM | POA: Diagnosis not present

## 2017-10-08 DIAGNOSIS — I4891 Unspecified atrial fibrillation: Secondary | ICD-10-CM | POA: Diagnosis not present

## 2017-10-08 DIAGNOSIS — R739 Hyperglycemia, unspecified: Secondary | ICD-10-CM | POA: Diagnosis not present

## 2017-10-08 DIAGNOSIS — M25561 Pain in right knee: Secondary | ICD-10-CM | POA: Diagnosis not present

## 2017-10-12 ENCOUNTER — Ambulatory Visit: Payer: Medicare Other

## 2017-10-12 ENCOUNTER — Ambulatory Visit (INDEPENDENT_AMBULATORY_CARE_PROVIDER_SITE_OTHER): Payer: Medicare Other | Admitting: Family Medicine

## 2017-10-12 VITALS — BP 138/70 | HR 58 | Ht 67.0 in | Wt 156.0 lb

## 2017-10-12 DIAGNOSIS — M179 Osteoarthritis of knee, unspecified: Secondary | ICD-10-CM | POA: Diagnosis not present

## 2017-10-12 DIAGNOSIS — M25569 Pain in unspecified knee: Secondary | ICD-10-CM

## 2017-10-12 NOTE — Progress Notes (Signed)
Impression and Recommendations:    1. Acute knee pain, unspecified laterality     KNEE PAIN:  - PT referral placed. Discussed the risks benefits of this with patient and importance of strengthening muscles around the knee area regardless of pathology. - X-ray R knee- orders placed and instructions given to patient - NSAIDS when necessary; risk benefits discussed with patient. - ICE for 15-20 minutes, 3-4 times per day. - pt understands may need MRI if knee continues to be unstable/ does not improve - Pt will make follow-up appointment for complete physical exam with fasting blood work in the near future at [facility].   There are no diagnoses linked to this encounter.  There are no diagnoses linked to this encounter.  No problem-specific Assessment & Plan notes found for this encounter.    Education and routine counseling performed. Handouts provided.  Orders Placed This Encounter  Procedures  . DG Knee Complete 4 Views Right    The patient was counselled, risk factors were discussed, anticipatory guidance given.     Return if symptoms worsen or fail to improve.  Please see AVS handed out to patient at the end of our visit for further patient instructions/ counseling done pertaining to today's office visit.  Note: This document was prepared using Dragon voice recognition software and may include unintentional dictation errors.    Subjective: Ronald Kelley is a 76 y.o. male who sustained a right knee pain 2-3 month(s) ago.  8/10 pain when worst.  When the pain is at its best 0 out of 10.   Mechanism of injury: -None no known injury.   Symptoms have been chronic since that time.   Prior history of related problems: no prior problems with this area in the past, previous knee injury none.  Noticed pain, some swelling, worse with climbing steps and if turns over in bed at night.  Taking 2 ibuprofen's every 4 hours for the past month- 1.32months now.  Getting worse.    Patient Care Team    Relationship Specialty Notifications Start End  [name], DO PCP - General Family Medicine  08/28/16   Kathee Delton, MD Referring Physician Pulmonary Disease  09/24/16    Comment: Sleep medicine-  sees patient for his OSA  Kristeen Miss, MD Consulting Physician Neurosurgery  09/27/16   Milus Banister, MD Attending Physician Gastroenterology  09/27/16   Festus Aloe, MD Consulting Physician Urology  09/27/16      Past Medical History:  Diagnosis Date  . Atrial fib/flutter, transient   . Degenerative disk disease   . Diabetes mellitus    a1c 6.5, 07/2008  . Hyperthyroidism    remote h/o , no ablation  . OSA on CPAP     Past Surgical History:  Procedure Laterality Date  . HERNIA REPAIR  2010  . 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 ROTATOR CUFF REPAIR      @HXFAM @  Social History   Substance and Sexual Activity  Drug Use No  ,  Social History   Substance and Sexual Activity  Alcohol Use No  ,  Social History   Tobacco Use  Smoking Status Never Smoker  Smokeless Tobacco Never Used  ,  Social History   Substance and Sexual Activity  Sexual Activity Not on file      Medication List  Accurate as of 10/12/17 10:24 AM. Always use your most recent med list.          * cholecalciferol 1000 units tablet Commonly known as:  VITAMIN D   * Vitamin D3 5000 units Tabs 5,000 IU OTC vitamin D3 daily.   CINNAMON PO   ibuprofen 200 MG tablet Commonly known as:  ADVIL,MOTRIN   magnesium 30 MG tablet Take 1 tablet (30 mg total) by mouth 2 (two) times daily.      * This list has 2 medication(s) that are the same as other medications prescribed for you. Read the directions carefully, and ask your doctor or other care provider to review them with you.          Hydrocodone-acetaminophen; Oxycodone; and Oxycodone-acetaminophen  Scheduled  Meds: Continuous Infusions: PRN Meds:.  Review of Systems: General:   Denies fever, chills, unexplained weight loss.  Optho/Auditory:   Denies visual changes, blurred vision/LOV Respiratory:   Denies SOB, DOE more than baseline levels.  Cardiovascular:   Denies chest pain, palpitations, new onset peripheral edema  Gastrointestinal:   Denies nausea, vomiting, diarrhea.  Genitourinary: Denies dysuria, freq/ urgency, flank pain or discharge from genitals.  Endocrine:     Denies hot or cold intolerance, polyuria, polydipsia. Musculoskeletal:   See above in HPI  Skin:  Denies rash, suspicious lesions Neurological:     Denies dizziness, unexplained weakness, numbness  Psychiatric/Behavioral:   Denies mood changes, suicidal or homicidal ideations, hallucinations   Objective:   Blood pressure (!) 160/78, pulse (!) 58, height 5\' 7"  (1.702 m), weight 156 lb (70.8 kg). Body mass index is 24.43 kg/m. General: Well Developed, well nourished, and in no acute distress.  Neuro: Alert and oriented x3, extra-ocular muscles intact, sensation grossly intact.  HEENT: Normocephalic, atraumatic, pupils equal round reactive to light, neck supple Skin: no gross suspicious lesions or rashes  Cardiac: Regular rate and rhythm, no murmurs rubs or gallops.  Respiratory: Essentially clear to auscultation bilaterally. Not using accessory muscles, speaking in full sentences.  Abdominal: Soft, not grossly distended Musculoskeletal: Ambulates w/o diff, FROM * 4 ext.  R Knee:  Scant amount peripatellar swelling-superior aspect o/w essential normal exam; no gross instability to posterior or anterior drawer test; negative varus stress test; no discomfort on valgus stress test.  FROM without crepitus, mild soft tissue swelling posterior knee, no ecchymosis,  Neurovascularly intact distally. No pain upon quick palpation of the ipsilateral hip and ankle joints. Vasc: less 2 sec cap RF, warm and pink  Psych: No HI/SI,  judgement and insight good, Euthymic mood. Full Affect.

## 2017-10-12 NOTE — Patient Instructions (Signed)
Ice the knee 15-2min daily / multiple times per day  - prefers to go to s-e PT.    -Patient will let me know if he would like to try a injection in the future after he gets a couple of weeks of physical therapy under his belt.  He understands that this will be a temporary issue and it may or may not help him.

## 2017-11-05 ENCOUNTER — Encounter: Payer: Self-pay | Admitting: Family Medicine

## 2017-11-05 ENCOUNTER — Ambulatory Visit (INDEPENDENT_AMBULATORY_CARE_PROVIDER_SITE_OTHER): Payer: Medicare Other | Admitting: Family Medicine

## 2017-11-05 VITALS — BP 128/71 | HR 71 | Ht 67.0 in | Wt 153.0 lb

## 2017-11-05 DIAGNOSIS — E559 Vitamin D deficiency, unspecified: Secondary | ICD-10-CM | POA: Diagnosis not present

## 2017-11-05 DIAGNOSIS — M25569 Pain in unspecified knee: Secondary | ICD-10-CM | POA: Diagnosis not present

## 2017-11-05 DIAGNOSIS — I10 Essential (primary) hypertension: Secondary | ICD-10-CM | POA: Diagnosis not present

## 2017-11-05 DIAGNOSIS — R7303 Prediabetes: Secondary | ICD-10-CM | POA: Diagnosis not present

## 2017-11-05 DIAGNOSIS — Z0000 Encounter for general adult medical examination without abnormal findings: Secondary | ICD-10-CM

## 2017-11-05 NOTE — Patient Instructions (Signed)
Risk factors for prediabetes and type 2 diabetes  Researchers don't fully understand why some people develop prediabetes and type 2 diabetes and others don't.  It's clear that certain factors increase the risk, however, including:  Weight. The more fatty tissue you have, the more resistant your cells become to insulin.  Inactivity. The less active you are, the greater your risk. Physical activity helps you control your weight, uses up glucose as energy and makes your cells more sensitive to insulin.  Family history. Your risk increases if a parent or sibling has type 2 diabetes.  Race. Although it's unclear why, people of certain races - including blacks, Hispanics, American Indians and Asian-Americans - are at higher risk.  Age. Your risk increases as you get older. This may be because you tend to exercise less, lose muscle mass and gain weight as you age. But type 2 diabetes is also increasing dramatically among children, adolescents and younger adults.  Gestational diabetes. If you developed gestational diabetes when you were pregnant, your risk of developing prediabetes and type 2 diabetes later increases. If you gave birth to a baby weighing more than 9 pounds (4 kilograms), you're also at risk of type 2 diabetes.  Polycystic ovary syndrome. For women, having polycystic ovary syndrome - a common condition characterized by irregular menstrual periods, excess hair growth and obesity - increases the risk of diabetes.  High blood pressure. Having blood pressure over 140/90 millimeters of mercury (mm Hg) is linked to an increased risk of type 2 diabetes.  Abnormal cholesterol and triglyceride levels. If you have low levels of high-density lipoprotein (HDL), or "good," cholesterol, your risk of type 2 diabetes is higher. Triglycerides are another type of fat carried in the blood. People with high levels of triglycerides have an increased risk of type 2 diabetes. Your doctor can let you know what your  cholesterol and triglyceride levels are.  A good guide to good carbs: The glycemic index ---If you have diabetes, or at risk for diabetes, you know all too well that when you eat carbohydrates, your blood sugar goes up. The total amount of carbs you consume at a meal or in a snack mostly determines what your blood sugar will do. But the food itself also plays a role. A serving of white rice has almost the same effect as eating pure table sugar - a quick, high spike in blood sugar. A serving of lentils has a slower, smaller effect.  ---Picking good sources of carbs can help you control your blood sugar and your weight. Even if you don't have diabetes, eating healthier carbohydrate-rich foods can help ward off a host of chronic conditions, from heart disease to various cancers to, well, diabetes.  ---One way to choose foods is with the glycemic index (GI). This tool measures how much a food boosts blood sugar.  The glycemic index rates the effect of a specific amount of a food on blood sugar compared with the same amount of pure glucose. A food with a glycemic index of 28 boosts blood sugar only 28% as much as pure glucose. One with a GI of 95 acts like pure glucose.    High glycemic foods result in a quick spike in insulin and blood sugar (also known as blood glucose).  Low glycemic foods have a slower, smaller effect- these are healthier for you.   Using the glycemic index Using the glycemic index is easy: choose foods in the low GI category instead of those in the high  GI category (see below), and go easy on those in between. Low glycemic index (GI of 55 or less): Most fruits and vegetables, beans, minimally processed grains, pasta, low-fat dairy foods, and nuts.  Moderate glycemic index (GI 56 to 69): White and sweet potatoes, corn, white rice, couscous, breakfast cereals such as Cream of Wheat and Mini Wheats.  High glycemic index (GI of 70 or higher): White bread, rice cakes, most crackers,  bagels, cakes, doughnuts, croissants, most packaged breakfast cereals. You can see the values for 100 commons foods and get links to more at www.[email or web address].si.  Swaps for lowering glycemic index  Instead of this high-glycemic index food Eat this lower-glycemic index food  White rice Brown rice or converted rice  Instant oatmeal Steel-cut oats  Cornflakes Bran flakes  Baked potato Pasta, bulgur  White bread Whole-grain bread  Corn Peas or leafy greens       Prediabetes Eating Plan  Prediabetes--also called impaired glucose tolerance or impaired fasting glucose--is a condition that causes blood sugar (blood glucose) levels to be higher than normal. Following a healthy diet can help to keep prediabetes under control. It can also help to lower the risk of type 2 diabetes and heart disease, which are increased in people who have prediabetes. Along with regular exercise, a healthy diet:  Promotes weight loss.  Helps to control blood sugar levels.  Helps to improve the way that the body uses insulin.   WHAT DO I NEED TO KNOW ABOUT THIS EATING PLAN?   Use the glycemic index (GI) to plan your meals. The index tells you how quickly a food will raise your blood sugar. Choose low-GI foods. These foods take a longer time to raise blood sugar.  Pay close attention to the amount of carbohydrates in the food that you eat. Carbohydrates increase blood sugar levels.  Keep track of how many calories you take in. Eating the right amount of calories will help you to achieve a healthy weight. Losing about 7 percent of your starting weight can help to prevent type 2 diabetes.  You may want to follow a Mediterranean diet. This diet includes a lot of vegetables, lean meats or fish, whole grains, fruits, and healthy oils and fats.   WHAT FOODS CAN I EAT?  Grains Whole grains, such as whole-wheat or whole-grain breads, crackers, cereals, and pasta. Unsweetened oatmeal. Bulgur. Barley.  Quinoa. Brown rice. Corn or whole-wheat flour tortillas or taco shells. Vegetables Lettuce. Spinach. Peas. Beets. Cauliflower. Cabbage. Broccoli. Carrots. Tomatoes. Squash. Eggplant. Herbs. Peppers. Onions. Cucumbers. Brussels sprouts. Fruits Berries. Bananas. Apples. Oranges. Grapes. Papaya. Mango. Pomegranate. Kiwi. Grapefruit. Cherries. Meats and Other Protein Sources Seafood. Lean meats, such as chicken and [place] or lean cuts of pork and beef. Tofu. Eggs. Nuts. Beans. Dairy Low-fat or fat-free dairy products, such as yogurt, cottage cheese, and cheese. Beverages Water. Tea. Coffee. Sugar-free or diet soda. Seltzer water. Milk. Milk alternatives, such as soy or almond milk. Condiments Mustard. Relish. Low-fat, low-sugar ketchup. Low-fat, low-sugar barbecue sauce. Low-fat or fat-free mayonnaise. Sweets and Desserts Sugar-free or low-fat pudding. Sugar-free or low-fat ice cream and other frozen treats. Fats and Oils Avocado. Walnuts. Olive oil. The items listed above may not be a complete list of recommended foods or beverages. Contact your dietitian for more options.    WHAT FOODS ARE NOT RECOMMENDED?  Grains Refined white flour and flour products, such as bread, pasta, snack foods, and cereals. Beverages Sweetened drinks, such as sweet iced tea and soda. Sweets and Desserts  Baked goods, such as cake, cupcakes, pastries, cookies, and cheesecake. The items listed above may not be a complete list of foods and beverages to avoid. Contact your dietitian for more information.   This information is not intended to replace advice given to you by your health care provider. Make sure you discuss any questions you have with your health care provider.   Document Released: 04/03/2015 Document Reviewed: 04/03/2015 Elsevier Interactive Patient Education 2016 [place] for Adults, Male A healthy lifestyle and preventive care can promote health and wellness.  Preventive health guidelines for men include the following key practices:  A routine yearly physical is a good way to check with your health care provider about your health and preventative screening. It is a chance to share any concerns and updates on your health and to receive a thorough exam.  Visit your dentist for a routine exam and preventative care every 6 months. Brush your teeth twice a day and floss once a day. Good oral hygiene prevents tooth decay and gum disease.  The frequency of eye exams is based on your age, health, family medical history, use of contact lenses, and other factors. Follow your health care provider's recommendations for frequency of eye exams.  Eat a healthy diet. Foods such as vegetables, fruits, whole grains, low-fat dairy products, and lean protein foods contain the nutrients you need without too many calories. Decrease your intake of foods high in solid fats, added sugars, and salt. Eat the right amount of calories for you.Get information about a proper diet from your health care provider, if necessary.  Regular physical exercise is one of the most important things you can do for your health. Most adults should get at least 150 minutes of moderate-intensity exercise (any activity that increases your heart rate and causes you to sweat) each week. In addition, most adults need muscle-strengthening exercises on 2 or more days a week.  Maintain a healthy weight. The body mass index (BMI) is a screening tool to identify possible weight problems. It provides an estimate of body fat based on height and weight. Your health care provider can find your BMI and can help you achieve or maintain a healthy weight.For adults 20 years and older:  A BMI below 18.5 is considered underweight.  A BMI of 18.5 to 24.9 is normal.  A BMI of 25 to 29.9 is considered overweight.  A BMI of 30 and above is considered obese.  Maintain normal blood lipids and cholesterol levels by  exercising and minimizing your intake of saturated fat. Eat a balanced diet with plenty of fruit and vegetables. Blood tests for lipids and cholesterol should begin at age 62 and be repeated every 5 years. If your lipid or cholesterol levels are high, you are over 50, or you are at high risk for heart disease, you may need your cholesterol levels checked more frequently.Ongoing high lipid and cholesterol levels should be treated with medicines if diet and exercise are not working.  If you smoke, find out from your health care provider how to quit. If you do not use tobacco, do not start.  Lung cancer screening is recommended for adults aged 48-80 years who are at high risk for developing lung cancer because of a history of smoking. A yearly low-dose CT scan of the lungs is recommended for people who have at least a 30-pack-year history of smoking and are a current smoker or have quit within the past  15 years. A pack year of smoking is smoking an average of 1 pack of cigarettes a day for 1 year (for example: 1 pack a day for 30 years or 2 packs a day for 15 years). Yearly screening should continue until the smoker has stopped smoking for at least 15 years. Yearly screening should be stopped for people who develop a health problem that would prevent them from having lung cancer treatment.  If you choose to drink alcohol, do not have more than 2 drinks per day. One drink is considered to be 12 ounces (355 mL) of beer, 5 ounces (148 mL) of wine, or 1.5 ounces (44 mL) of liquor.  Avoid use of street drugs. Do not share needles with anyone. Ask for help if you need support or instructions about stopping the use of drugs.  High blood pressure causes heart disease and increases the risk of stroke. Your blood pressure should be checked at least every 1-2 years. Ongoing high blood pressure should be treated with medicines, if weight loss and exercise are not effective.  If you are 68-71 years old, ask your health  care provider if you should take aspirin to prevent heart disease.  Diabetes screening is done by taking a blood sample to check your blood glucose level after you have not eaten for a certain period of time (fasting). If you are not overweight and you do not have risk factors for diabetes, you should be screened once every 3 years starting at age 31. If you are overweight or obese and you are 23-53 years of age, you should be screened for diabetes every year as part of your cardiovascular risk assessment.  Colorectal cancer can be detected and often prevented. Most routine colorectal cancer screening begins at the age of 82 and continues through age 74. However, your health care provider may recommend screening at an earlier age if you have risk factors for colon cancer. On a yearly basis, your health care provider may provide home test kits to check for hidden blood in the stool. Use of a small camera at the end of a tube to directly examine the colon (sigmoidoscopy or colonoscopy) can detect the earliest forms of colorectal cancer. Talk to your health care provider about this at age 30, when routine screening begins. Direct exam of the colon should be repeated every 5-10 years through age 16, unless early forms of precancerous polyps or small growths are found.  People who are at an increased risk for hepatitis B should be screened for this virus. You are considered at high risk for hepatitis B if:  You were born in a country where hepatitis B occurs often. Talk with your health care provider about which countries are considered high risk.  Your parents were born in a high-risk country and you have not received a shot to protect against hepatitis B (hepatitis B vaccine).  You have HIV or AIDS.  You use needles to inject street drugs.  You live with, or have sex with, someone who has hepatitis B.  You are a man who has sex with other men (MSM).  You get hemodialysis treatment.  You take  certain medicines for conditions such as cancer, organ transplantation, and autoimmune conditions.  Hepatitis C blood testing is recommended for all people born from [birth year] through 1966 and any individual with known risks for hepatitis C.  Practice safe sex. Use condoms and avoid high-risk sexual practices to reduce the spread of sexually transmitted infections (STIs).  STIs include gonorrhea, chlamydia, syphilis, trichomonas, herpes, HPV, and human immunodeficiency virus (HIV). Herpes, HIV, and HPV are viral illnesses that have no cure. They can result in disability, cancer, and death.  If you are a man who has sex with other men, you should be screened at least once per year for:  HIV.  Urethral, rectal, and pharyngeal infection of gonorrhea, chlamydia, or both.  If you are at risk of being infected with HIV, it is recommended that you take a prescription medicine daily to prevent HIV infection. This is called preexposure prophylaxis (PrEP). You are considered at risk if:  You are a man who has sex with other men (MSM) and have other risk factors.  You are a heterosexual man, are sexually active, and are at increased risk for HIV infection.  You take drugs by injection.  You are sexually active with a partner who has HIV.  Talk with your health care provider about whether you are at high risk of being infected with HIV. If you choose to begin PrEP, you should first be tested for HIV. You should then be tested every 3 months for as long as you are taking PrEP.  A one-time screening for abdominal aortic aneurysm (AAA) and surgical repair of large AAAs by ultrasound are recommended for men ages 70 to 93 years who are current or former smokers.  Healthy men should no longer receive prostate-specific antigen (PSA) blood tests as part of routine cancer screening. Talk with your health care provider about prostate cancer screening.  Testicular cancer screening is not recommended for adult males  who have no symptoms. Screening includes self-exam, a health care provider exam, and other screening tests. Consult with your health care provider about any symptoms you have or any concerns you have about testicular cancer.  Use sunscreen. Apply sunscreen liberally and repeatedly throughout the day. You should seek shade when your shadow is shorter than you. Protect yourself by wearing long sleeves, pants, a wide-brimmed hat, and sunglasses year round, whenever you are outdoors.  Once a month, do a whole-body skin exam, using a mirror to look at the skin on your back. Tell your health care provider about new moles, moles that have irregular borders, moles that are larger than a pencil eraser, or moles that have changed in shape or color.  Stay current with required vaccines (immunizations).  Influenza vaccine. All adults should be immunized every year.  Tetanus, diphtheria, and acellular pertussis (Td, Tdap) vaccine. An adult who has not previously received Tdap or who does not know his vaccine status should receive 1 dose of Tdap. This initial dose should be followed by tetanus and diphtheria toxoids (Td) booster doses every 10 years. Adults with an unknown or incomplete history of completing a 3-dose immunization series with Td-containing vaccines should begin or complete a primary immunization series including a Tdap dose. Adults should receive a Td booster every 10 years.  Varicella vaccine. An adult without evidence of immunity to varicella should receive 2 doses or a second dose if he has previously received 1 dose.  Human papillomavirus (HPV) vaccine. Males aged 11-21 years who have not received the vaccine previously should receive the 3-dose series. Males aged 22-26 years may be immunized. Immunization is recommended through the age of 63 years for any male who has sex with males and did not get any or all doses earlier. Immunization is recommended for any person with an immunocompromised  condition through the age of 15 years if he  did not get any or all doses earlier. During the 3-dose series, the second dose should be obtained 4-8 weeks after the first dose. The third dose should be obtained 24 weeks after the first dose and 16 weeks after the second dose.  Zoster vaccine. One dose is recommended for adults aged 68 years or older unless certain conditions are present.  Measles, mumps, and rubella (MMR) vaccine. Adults born before [birth year] generally are considered immune to measles and mumps. Adults born in [birth year] or later should have 1 or more doses of MMR vaccine unless there is a contraindication to the vaccine or there is laboratory evidence of immunity to each of the three diseases. A routine second dose of MMR vaccine should be obtained at least 28 days after the first dose for students attending postsecondary schools, health care workers, or international travelers. People who received inactivated measles vaccine or an unknown type of measles vaccine during 1963-1967 should receive 2 doses of MMR vaccine. People who received inactivated mumps vaccine or an unknown type of mumps vaccine before 1979 and are at high risk for mumps infection should consider immunization with 2 doses of MMR vaccine. Unvaccinated health care workers born before [birth year] who lack laboratory evidence of measles, mumps, or rubella immunity or laboratory confirmation of disease should consider measles and mumps immunization with 2 doses of MMR vaccine or rubella immunization with 1 dose of MMR vaccine.  Pneumococcal 13-valent conjugate (PCV13) vaccine. When indicated, a person who is uncertain of his immunization history and has no record of immunization should receive the PCV13 vaccine. All adults 39 years of age and older should receive this vaccine. An adult aged 43 years or older who has certain medical conditions and has not been previously immunized should receive 1 dose of PCV13 vaccine. This PCV13 should be  followed with a dose of pneumococcal polysaccharide (PPSV23) vaccine. Adults who are at high risk for pneumococcal disease should obtain the PPSV23 vaccine at least 8 weeks after the dose of PCV13 vaccine. Adults older than 77 years of age who have normal immune system function should obtain the PPSV23 vaccine dose at least 1 year after the dose of PCV13 vaccine.  Pneumococcal polysaccharide (PPSV23) vaccine. When PCV13 is also indicated, PCV13 should be obtained first. All adults aged 31 years and older should be immunized. An adult younger than age 46 years who has certain medical conditions should be immunized. Any person who resides in a nursing home or long-term care facility should be immunized. An adult smoker should be immunized. People with an immunocompromised condition and certain other conditions should receive both PCV13 and PPSV23 vaccines. People with human immunodeficiency virus (HIV) infection should be immunized as soon as possible after diagnosis. Immunization during chemotherapy or radiation therapy should be avoided. Routine use of PPSV23 vaccine is not recommended for American Indians, [place] Natives, or people younger than 65 years unless there are medical conditions that require PPSV23 vaccine. When indicated, people who have unknown immunization and have no record of immunization should receive PPSV23 vaccine. One-time revaccination 5 years after the first dose of PPSV23 is recommended for people aged 19-64 years who have chronic kidney failure, nephrotic syndrome, asplenia, or immunocompromised conditions. People who received 1-2 doses of PPSV23 before age 59 years should receive another dose of PPSV23 vaccine at age 54 years or later if at least 5 years have passed since the previous dose. Doses of PPSV23 are not needed for people immunized with PPSV23 at or after age  65 years.  Meningococcal vaccine. Adults with asplenia or persistent complement component deficiencies should receive 2  doses of quadrivalent meningococcal conjugate (MenACWY-D) vaccine. The doses should be obtained at least 2 months apart. Microbiologists working with certain meningococcal bacteria, [employer] recruits, people at risk during an outbreak, and people who travel to or live in countries with a high rate of meningitis should be immunized. A first-year college student up through age 12 years who is living in a residence hall should receive a dose if he did not receive a dose on or after his 16th birthday. Adults who have certain high-risk conditions should receive one or more doses of vaccine.  Hepatitis A vaccine. Adults who wish to be protected from this disease, have chronic liver disease, work with hepatitis A-infected animals, work in hepatitis A research labs, or travel to or work in countries with a high rate of hepatitis A should be immunized. Adults who were previously unvaccinated and who anticipate close contact with an international adoptee during the first 60 days after arrival in the [place] from a country with a high rate of hepatitis A should be immunized.  Hepatitis B vaccine. Adults should be immunized if they wish to be protected from this disease, are under age 38 years and have diabetes, have chronic liver disease, have had more than one sex partner in the past 6 months, may be exposed to blood or other infectious body fluids, are household contacts or sex partners of hepatitis B positive people, are clients or workers in certain care facilities, or travel to or work in countries with a high rate of hepatitis B.  Haemophilus influenzae type b (Hib) vaccine. A previously unvaccinated person with asplenia or sickle cell disease or having a scheduled splenectomy should receive 1 dose of Hib vaccine. Regardless of previous immunization, a recipient of a hematopoietic stem cell transplant should receive a 3-dose series 6-12 months after his successful transplant. Hib vaccine is not recommended  for adults with HIV infection. Preventive Service / Frequency Ages 75 and over  Blood pressure check.** / Every year.  Lipid and cholesterol check.**/ Every 5 years beginning at age 32.  Lung cancer screening. / Every year if you are aged 73-80 years and have a 30-pack-year history of smoking and currently smoke or have quit within the past 15 years. Yearly screening is stopped once you have quit smoking for at least 15 years or develop a health problem that would prevent you from having lung cancer treatment.  Fecal occult blood test (FOBT) of stool. / Every year beginning at age 55 and continuing until age 65. You may not have to do this test if you get a colonoscopy every 10 years.  Flexible sigmoidoscopy** or colonoscopy.** / Every 5 years for a flexible sigmoidoscopy or every 10 years for a colonoscopy beginning at age 55 and continuing until age 80.  Hepatitis C blood test.** / For all people born from [birth year] through 1966 and any individual with known risks for hepatitis C.  Abdominal aortic aneurysm (AAA) screening.** / A one-time screening for ages 41 to 72 years who are current or former smokers.  Skin self-exam. / Monthly.  Influenza vaccine. / Every year.  Tetanus, diphtheria, and acellular pertussis (Tdap/Td) vaccine.** / 1 dose of Td every 10 years.  Varicella vaccine.** / Consult your health care provider.  Zoster vaccine.** / 1 dose for adults aged 68 years or older.  Pneumococcal 13-valent conjugate (PCV13) vaccine.** / 1 dose for all adults  aged 44 years and older.  Pneumococcal polysaccharide (PPSV23) vaccine.** / 1 dose for all adults aged 25 years and older.  Meningococcal vaccine.** / Consult your health care provider.  Hepatitis A vaccine.** / Consult your health care provider.  Hepatitis B vaccine.** / Consult your health care provider.  Haemophilus influenzae type b (Hib) vaccine.** / Consult your health care provider. **Family history and personal history  of risk and conditions may change your health care provider's recommendations.   This information is not intended to replace advice given to you by your health care provider. Make sure you discuss any questions you have with your health care provider.   Document Released: 01/13/2002 Document Revised: 12/08/2014 Document Reviewed: 04/14/2011 Elsevier Interactive Patient Education [employer].

## 2017-11-05 NOTE — Progress Notes (Signed)
Subjective:   Ronald Kelley is a 76 y.o. male who presents for Medicare Annual/Subsequent preventive examination.   Depression screen [facility] 2/9 11/05/2017 10/12/2017 07/27/2017  Decreased Interest 0 0 1  Down, Depressed, Hopeless 0 0 0  PHQ - 2 Score 0 0 1  Altered sleeping 0 - -  Tired, decreased energy 0 - -  Change in appetite 0 - -  Feeling bad or failure about yourself  0 - -  Trouble concentrating 0 - -  Moving slowly or fidgety/restless 0 - -  PHQ-9 Score 0 - -   Fall Risk  11/05/2017 10/12/2017 07/27/2017 07/31/2015 04/27/2013  Falls in the past year? Yes No Yes Yes No  Comment near falls - - - -  Number falls in past yr: - - 1 1 -  Injury with Fall? No - Yes No -  Follow up - - - Falls evaluation completed -   Current Exercise Habits: The patient does not participate in regular exercise at present    Functional Status Survey: Is the patient deaf or have difficulty hearing?: Yes(difficulty with soft or whispered voices, having to ask people to speak up or repeat) Does the patient have difficulty seeing, even when wearing glasses/contacts?: No Does the patient have difficulty concentrating, remembering, or making decisions?: No Does the patient have difficulty walking or climbing stairs?: No Does the patient have difficulty dressing or bathing?: No Does the patient have difficulty doing errands alone such as visiting a doctor's office or shopping?: No Activities of Daily Living In your present state of health, do you have difficulty performing the following activities?  1- Driving - no 2- Managing money - no 3- Feeding yourself - no 4- Getting from the bed to the chair - no 5- Climbing a flight of stairs - no 6- Preparing food and eating - no 7- Bathing or showering - no 8- Getting dressed - no 9- Getting to the toilet - no 10- Using the toilet - no 11- Moving around from place to place - no  Patient states that he feels safe at home.     Health Maintenance   Topic Date Due  . Pneumonia vaccines (2 of 2 - PCV13) 11/05/2017*  . Flu Shot  03/07/2018*  . Hemoglobin A1C  02/03/2018  . Tetanus Vaccine  06/23/2018  . Complete foot exam   Discontinued  . Eye exam for diabetics  Discontinued  . Urine Protein Check  Discontinued  *Topic was postponed. The date shown is not the original due date.     Immunization History  Administered Date(s) Administered  . Pneumococcal Polysaccharide-23 06/23/2008  . Td 06/23/2008    Diabetic Foot Exam - Simple   Simple Foot Form Visual Inspection See comments:  Yes Sensation Testing Intact to touch and monofilament testing bilaterally:  Yes Pulse Check Posterior Tibialis and Dorsalis pulse intact bilaterally:  Yes Comments Patient has dry skin on feet and issues with thick and yellow nails.     Review of Systems: General:   Denies fever, chills, unexplained weight loss.  Optho/Auditory:   Denies visual changes, blurred vision/LOV Respiratory:   Denies SOB, DOE more than baseline levels.  Cardiovascular:   Denies chest pain, palpitations, new onset peripheral edema  Gastrointestinal:   Denies nausea, vomiting, diarrhea.  Genitourinary: Denies dysuria, freq/ urgency, flank pain or discharge from genitals.  Endocrine:     Denies hot or cold intolerance, polyuria, polydipsia. Musculoskeletal:   Denies unexplained myalgias, joint swelling, unexplained arthralgias,  gait problems.  Skin:  Denies rash, suspicious lesions Neurological:     Denies dizziness, unexplained weakness, numbness  Psychiatric/Behavioral:   Denies mood changes, suicidal or homicidal ideations, hallucinations       Objective:    Vitals: BP 128/71   Pulse 71   Ht 5\' 7"  (1.702 m)   Wt 153 lb (69.4 kg)   BMI 23.96 kg/m   Body mass index is 23.96 kg/m.  Advanced Directives 08/01/2017 07/31/2017 09/24/2016  Does Patient Have a Medical Advance Directive? No No Yes  Type of Advance Directive - - [place]  Does  patient want to make changes to medical advance directive? - - No - Patient declined  Copy of [place] in Chart? - - No - copy requested  Would patient like information on creating a medical advance directive? - Yes (ED - Information included in AVS) -    Tobacco Social History   Tobacco Use  Smoking Status Never Smoker  Smokeless Tobacco Never Used     Counseling given: No     Past Medical History:  Diagnosis Date  . Atrial fib/flutter, transient   . Degenerative disk disease   . Diabetes mellitus    a1c 6.5, 07/2008  . Hyperthyroidism    remote h/o , no ablation  . OSA on CPAP    Past Surgical History:  Procedure Laterality Date  . HERNIA REPAIR  2010  . 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 ROTATOR CUFF REPAIR     Family History  Problem Relation Age of Onset  . Hypertension Mother   . Diabetes Brother   . Alzheimer's disease Father 4  . Coronary artery disease Neg Hx   . Stroke Neg Hx   . Colon cancer Neg Hx   . Prostate cancer Neg Hx    Social History   Socioeconomic History  . Marital status: Married    Spouse name: None  . Number of children: 3  . Years of education: None  . Highest education level: None  Social Needs  . Financial resource strain: None  . Food insecurity - worry: None  . Food insecurity - inability: None  . Transportation needs - medical: None  . Transportation needs - non-medical: None  Occupational History  . Occupation: Technical sales engineer-- semi-retirement  Tobacco Use  . Smoking status: Never Smoker  . Smokeless tobacco: Never Used  Substance and Sexual Activity  . Alcohol use: No  . Drug use: No  . Sexual activity: None  Other Topics Concern  . None  Social History Narrative   Married to [place]        Outpatient Encounter Medications as of 11/05/2017  Medication Sig  . cholecalciferol (VITAMIN D) 1000 units tablet Take 1,000 Units by mouth  daily.  . Cholecalciferol (VITAMIN D3) 5000 units TABS 5,000 IU OTC vitamin D3 daily.  Marland Kitchen CINNAMON PO Take 2 tablets by mouth daily.   Marland Kitchen ibuprofen (ADVIL,MOTRIN) 200 MG tablet Take 200 mg by mouth every 6 (six) hours as needed.  . magnesium 30 MG tablet Take 1 tablet (30 mg total) by mouth 2 (two) times daily.   No facility-administered encounter medications on file as of 11/05/2017.     Activities of Daily Living In your present state of health, do you have any difficulty performing the following activities: 11/05/2017 08/01/2017  Hearing? Y N  Comment difficulty with soft  or whispered voices, having to ask people to speak up or repeat -  Vision? N N  Difficulty concentrating or making decisions? N N  Walking or climbing stairs? N N  Dressing or bathing? N N  Doing errands, shopping? N N  Some recent data might be hidden    Patient Care Team: [name], DO as PCP - General (Family Medicine) Clance, Armando Reichert, MD as Referring Physician (Pulmonary Disease) Kristeen Miss, MD as Consulting Physician (Neurosurgery) Milus Banister, MD as Attending Physician (Gastroenterology) Festus Aloe, MD as Consulting Physician (Urology) Delrae Rend, MD as Consulting Physician (Endocrinology)     Assessment:     Exercise Activities and Dietary recommendations Current Exercise Habits: The patient does not participate in regular exercise at present.  Discussed with patient to try to engage in 150 minutes of aerobic activity weekly.  We discussed moderate intensity.  Also recommended 2 days/week of weight lifting.  We reviewed AHA guidelines.  -Also for patient's elevated A1c we discussed him cutting back on his sweets which he likes so much.  Once over the holidays he will start working on this a little more.  Goals  -Patient will start a walking program.  He was start off by 10 minutes daily and then slowly work his way up to 30 minutes daily.  He will use the gym where there is a  indoor walking track so before giving on his knee. -Also goal is to cut back on sweets.      Fall Risk Fall Risk  11/05/2017 10/12/2017 07/27/2017 07/31/2015 04/27/2013  Falls in the past year? Yes No Yes Yes No  Comment near falls - - - -  Number falls in past yr: - - 1 1 -  Injury with Fall? No - Yes No -  Follow up - - - Falls evaluation completed -   Is the patient's home free of loose throw rugs in walkways, pet beds, electrical cords, etc?   no      Grab bars in the bathroom? no      Handrails on the stairs?   yes      Adequate lighting?   yes Depression Screen PHQ 2/9 Scores 11/05/2017 10/12/2017 07/27/2017 07/31/2015  PHQ - 2 Score 0 0 1 0  PHQ- 9 Score 0 - - -    Cognitive Function        Immunization History  Administered Date(s) Administered  . Pneumococcal Polysaccharide-23 06/23/2008  . Td 06/23/2008   Screening Tests Health Maintenance  Topic Date Due  . PNA vac Low Risk Adult (2 of 2 - PCV13) 11/05/2017 (Originally 06/23/2009)  . INFLUENZA VACCINE  03/07/2018 (Originally 07/01/2017)  . HEMOGLOBIN A1C  02/03/2018  . TETANUS/TDAP  06/23/2018  . FOOT EXAM  Discontinued  . OPHTHALMOLOGY EXAM  Discontinued  . URINE MICROALBUMIN  Discontinued   Cancer Screenings: Colorectal:  N/A due to age     Plan:    I have personally reviewed and noted the following in the patient's chart:   . Medical and social history . Use of alcohol, tobacco or illicit drugs  . Current medications and supplements . Functional ability and status . Nutritional status . Physical activity . Advanced directives . List of other physicians . Hospitalizations, surgeries, and ER visits in previous 12 months . Vitals . Screenings to include cognitive, depression, and falls . Referrals and appointments  In addition, I have reviewed and discussed with patient certain preventive protocols, quality metrics, and best practice recommendations.  A written personalized care plan for preventive  services as well as general preventive health recommendations were provided to patient.   -Patient will come in for fasting blood work in the future.  He will follow-up in 4 months or sooner if problems or concerns. Orders Placed This Encounter  Procedures  . Lipid panel  . VITAMIN D 25 Hydroxy (Vit-D Deficiency, Fractures)  . Comprehensive metabolic panel  . Hemoglobin A1c    [name], DO  11/05/2017

## 2017-11-11 DIAGNOSIS — M7051 Other bursitis of knee, right knee: Secondary | ICD-10-CM | POA: Diagnosis not present

## 2017-11-11 DIAGNOSIS — M25561 Pain in right knee: Secondary | ICD-10-CM | POA: Diagnosis not present

## 2017-11-16 DIAGNOSIS — E039 Hypothyroidism, unspecified: Secondary | ICD-10-CM | POA: Diagnosis not present

## 2017-11-18 DIAGNOSIS — M7051 Other bursitis of knee, right knee: Secondary | ICD-10-CM | POA: Diagnosis not present

## 2017-11-18 DIAGNOSIS — M25561 Pain in right knee: Secondary | ICD-10-CM | POA: Diagnosis not present

## 2017-11-23 DIAGNOSIS — M25561 Pain in right knee: Secondary | ICD-10-CM | POA: Diagnosis not present

## 2017-11-23 DIAGNOSIS — M7051 Other bursitis of knee, right knee: Secondary | ICD-10-CM | POA: Diagnosis not present

## 2017-12-07 DIAGNOSIS — E039 Hypothyroidism, unspecified: Secondary | ICD-10-CM | POA: Diagnosis not present

## 2017-12-07 LAB — TSH: TSH: 1.7 (ref 0.41–5.90)

## 2018-02-15 DIAGNOSIS — E069 Thyroiditis, unspecified: Secondary | ICD-10-CM | POA: Diagnosis not present

## 2018-02-15 DIAGNOSIS — E039 Hypothyroidism, unspecified: Secondary | ICD-10-CM | POA: Diagnosis not present

## 2018-03-04 ENCOUNTER — Telehealth: Payer: Self-pay | Admitting: Family Medicine

## 2018-03-04 NOTE — Telephone Encounter (Signed)
Patient left VM during lunch, he is still having knee pain even after seeing ortho, wants to speak with someone about his options. He doesn't know if he needs "an MRI or what". Please advise

## 2018-03-08 ENCOUNTER — Telehealth: Payer: Self-pay | Admitting: Family Medicine

## 2018-03-08 NOTE — Telephone Encounter (Signed)
Melissa please call patient and ask him if he would like referral to orthopedics for definitive care or if he would like to come in to get an x-ray with possible injection of steroids here. Thanks.  That would be the next step.

## 2018-03-08 NOTE — Telephone Encounter (Signed)
Called and spoke to the patient and he states that the knee pain is about the same, increases with walking and climbing stairs. Patient states that he has not seen ortho, but has done 3 visit with PT for the knee and it has not helped.  Patient would like to know what to do for the knee at this point. Please advise.  Patient notified. MPulliam, CMA/RT(R)

## 2018-03-08 NOTE — Telephone Encounter (Signed)
Patient is still waiting for response on knee pain, last message was put in on 4/4. Patient understands Dr. Jenetta Downer was out of office but was hoping for a call back today with what he should do next for this pain

## 2018-03-08 NOTE — Telephone Encounter (Signed)
Called and notified patient - appointment made with Dr. Raliegh Scarlet on 03/22/2018. MPulliam, CMA/RT(R)

## 2018-03-22 ENCOUNTER — Ambulatory Visit (INDEPENDENT_AMBULATORY_CARE_PROVIDER_SITE_OTHER): Payer: Medicare HMO | Admitting: Family Medicine

## 2018-03-22 ENCOUNTER — Encounter: Payer: Self-pay | Admitting: Family Medicine

## 2018-03-22 VITALS — BP 129/72 | HR 58 | Temp 97.6°F | Resp 18 | Ht 65.0 in | Wt 157.0 lb

## 2018-03-22 DIAGNOSIS — M1711 Unilateral primary osteoarthritis, right knee: Secondary | ICD-10-CM

## 2018-03-22 NOTE — Progress Notes (Signed)
Impression and Recommendations:    1. Primary osteoarthritis of right knee -severe tricompartmental OA     1. Primary osteoarthritis of R knee  -Referral given orthopedics. Pt prefers to see Dr. Wynelle Link. Discussed at length the benefits of injection vs orthopedic evaluation at this time. He agrees to see orthopedics.   Orders Placed This Encounter  Procedures  . Ambulatory referral to Orthopedic Surgery    No orders of the defined types were placed in this encounter.   Gross side effects, risk and benefits, and alternatives of medications and treatment plan in general discussed with patient.  Patient is aware that all medications have potential side effects and we are unable to predict every side effect or drug-drug interaction that may occur.   Patient will call with any questions prior to using medication if they have concerns.  Expresses verbal understanding and consents to current therapy and treatment regimen.  No barriers to understanding were identified.  Red flag symptoms and signs discussed in detail.  Patient expressed understanding regarding what to do in case of emergency\urgent symptoms  Please see AVS handed out to patient at the end of our visit for further patient instructions/ counseling done pertaining to today's office visit.   Return For any further joint or knee pains, for Follow-up for chronic care with me as previously discussed.    Note: This note was prepared with assistance of Dragon voice recognition software. Occasional wrong-word or sound-a-like substitutions may have occurred due to the inherent limitations of voice recognition software.   This document serves as a record of services personally performed by [name], DO. It was created on her behalf by Mayer Masker, a trained medical scribe. The creation of this record is based on the scribe's personal observations and the provider's statements to them.   I have reviewed the above medical  documentation for accuracy and completeness and I concur.  [name] 03/22/18 1:20 PM  --------------------------------------------------------------------------------------------------------------------------------------------------------------------------------------------------------------------------------------------    Subjective:     HPI: Ronald Kelley is a 77 y.o. male who presents to [place] at [facility] today for issues as discussed below.  Rt knee He is still having pain in his R knee. It waxes/wanes in intensity. He has been for three laser treatments (with chiropractor) and 3 physical therapist visits for his R knee. He is requesting a referral for an MRI. He is here today for a knee injection but has reservations about receiving a shot due to it being temporary. He has been to [facility] previously for shoulder pain and went to PT for shoulder pain.   He states currently his pain interrupts his daily life activity. He has pain every day but the intensity varies. He has difficulty doing some daily chores due to pain. He loves to play golf and has difficulty playing it due to pain.    Wt Readings from Last 3 Encounters:  03/22/18 157 lb (71.2 kg)  11/05/17 153 lb (69.4 kg)  10/12/17 156 lb (70.8 kg)   BP Readings from Last 3 Encounters:  03/22/18 129/72  11/05/17 128/71  10/12/17 138/70   Pulse Readings from Last 3 Encounters:  03/22/18 (!) 58  11/05/17 71  10/12/17 (!) 58   BMI Readings from Last 3 Encounters:  03/22/18 26.13 kg/m  11/05/17 23.96 kg/m  10/12/17 24.43 kg/m     Patient Care Team    Relationship Specialty Notifications Start End  [name], DO PCP - General Family  Medicine  08/28/16   Kathee Delton, MD Referring Physician Pulmonary Disease  09/24/16    Comment: Sleep medicine-  sees patient for his OSA  Kristeen Miss, MD Consulting Physician Neurosurgery  09/27/16   Milus Banister, MD Attending  Physician Gastroenterology  09/27/16   Festus Aloe, MD Consulting Physician Urology  09/27/16   Delrae Rend, MD Consulting Physician Endocrinology  11/05/17    Comment: for thyroid     Patient Active Problem List   Diagnosis Date Noted  . Impaired fasting glucose 09/27/2016    Priority: High  . History of vitamin D deficiency 09/27/2016    Priority: High  . Diet-controlled diabetes mellitus ([place]) 09/24/2016    Priority: High  . H/O ATRIAL FIBRILLATION, PAROXYSMAL 09/28/2008    Priority: High  . h/o Hyperthyroidism 06/23/2008    Priority: High  . h/o Leukopenia 11/05/2016    Priority: Medium  . GERD (gastroesophageal reflux disease) 04/28/2013    Priority: Medium  . OSA on CPAP 05/13/2010    Priority: Medium  . h/o Osteopenia 07/30/2014    Priority: Low  . Acute knee pain 10/12/2017  . Hypomagnesemia 08/06/2017  . Dehydration, mild 08/06/2017  . Elevated serum free T4 level 08/01/2017  . Low TSH level 08/01/2017  . Atrial fibrillation with RVR ([place]) 07/31/2017  . HLD 11/25/2016  . Sinusitis 11/05/2016  . Drug-induced low platelet count 11/05/2016  . Degenerative disk disease 09/27/2016  . Presbycusis 09/27/2016  . BPH associated with nocturia 09/24/2016  . Annual physical exam 04/27/2013  . Erectile dysfunction 10/08/2011  . Elevated PSA/ nocturia sx 09/15/2011  . INGUINAL HERNIA, LEFT 06/23/2008    Past Medical history, Surgical history, Family history, Social history, Allergies and Medications have been entered into the medical record, reviewed and changed as needed.    Current Meds  Medication Sig  . cholecalciferol (VITAMIN D) 1000 units tablet Take 1,000 Units by mouth daily.  . Cholecalciferol (VITAMIN D3) 5000 units TABS 5,000 IU OTC vitamin D3 daily.  Marland Kitchen CINNAMON PO Take 2 tablets by mouth daily.   Marland Kitchen ibuprofen (ADVIL,MOTRIN) 200 MG tablet Take 200 mg by mouth every 6 (six) hours as needed.  Marland Kitchen levothyroxine (SYNTHROID, LEVOTHROID) 88 MCG tablet Take  88 mcg by mouth daily before breakfast.  . magnesium 30 MG tablet Take 1 tablet (30 mg total) by mouth 2 (two) times daily.    Allergies:  Allergies  Allergen Reactions  . Hydrocodone-Acetaminophen Other (See Comments)    REACTION: atrial fibrilation  . Oxycodone Other (See Comments)    Atrial fibrilation  . Oxycodone-Acetaminophen Other (See Comments)    REACTION: atrial fibrilation   ok Darvocet     Review of Systems:  A fourteen system review of systems was performed and found to be positive as per HPI.   Objective:   Blood pressure 129/72, pulse (!) 58, temperature 97.6 F (36.4 C), temperature source Oral, resp. rate 18, height 5\' 5"  (1.651 m), weight 157 lb (71.2 kg), SpO2 97 %. Body mass index is 26.13 kg/m. General:  Well Developed, well nourished, appropriate for stated age.  Neuro:  Alert and oriented,  extra-ocular muscles intact  HEENT:  Normocephalic, atraumatic, neck supple, no carotid bruits appreciated  Skin:  no gross rash, warm, pink. Cardiac:  RRR, S1 S2 Respiratory:  ECTA B/L and A/P, Not using accessory muscles, speaking in full sentences- unlabored. Vascular:  Ext warm, no cyanosis apprec.; cap RF less 2 sec. Psych:  No HI/SI, judgement and insight good,  Euthymic mood. Full Affect. Musk- Medial aspect of R joint with large osteophyte formation. No TTP. No edema or erythema appreciated. Pt able to ambulate without dysfunction. Essentially full ROM.

## 2018-03-22 NOTE — Patient Instructions (Signed)
We sent you to [facility] per your request to see Dr. Maureen Ralphs.   Joint Pain Joint pain, which is also called arthralgia, can be caused by many things. Joint pain often goes away when you follow your health care provider's instructions for relieving pain at home. However, joint pain can also be caused by conditions that require further treatment. Common causes of joint pain include:  Bruising in the area of the joint.  Overuse of the joint.  Wear and tear on the joints that occur with aging (osteoarthritis).  Various other forms of arthritis.  A buildup of a crystal form of uric acid in the joint (gout).  Infections of the joint (septic arthritis) or of the bone (osteomyelitis).  Your health care provider may recommend medicine to help with the pain. If your joint pain continues, additional tests may be needed to diagnose your condition. Follow these instructions at home: Watch your condition for any changes. Follow these instructions as directed to lessen the pain that you are feeling.  Take medicines only as directed by your health care provider.  Rest the affected area for as long as your health care provider says that you should. If directed to do so, raise the painful joint above the level of your heart while you are sitting or lying down.  Do not do things that cause or worsen pain.  If directed, apply ice to the painful area: ? Put ice in a plastic bag. ? Place a towel between your skin and the bag. ? Leave the ice on for 20 minutes, 2-3 times per day.  Wear an elastic bandage, splint, or sling as directed by your health care provider. Loosen the elastic bandage or splint if your fingers or toes become numb and tingle, or if they turn cold and blue.  Begin exercising or stretching the affected area as directed by your health care provider. Ask your health care provider what types of exercise are safe for you.  Keep all follow-up visits as directed by your health care provider. This  is important.  Contact a health care provider if:  Your pain increases, and medicine does not help.  Your joint pain does not improve within 3 days.  You have increased bruising or swelling.  You have a fever.  You lose 10 lb (4.5 kg) or more without trying. Get help right away if:  You are not able to move the joint.  Your fingers or toes become numb or they turn cold and blue. This information is not intended to replace advice given to you by your health care provider. Make sure you discuss any questions you have with your health care provider. Document Released: 11/17/2005 Document Revised: 04/18/2016 Document Reviewed: 08/29/2014 Elsevier Interactive Patient Education  [employer].

## 2018-04-12 DIAGNOSIS — H0012 Chalazion right lower eyelid: Secondary | ICD-10-CM | POA: Diagnosis not present

## 2018-04-19 DIAGNOSIS — H0012 Chalazion right lower eyelid: Secondary | ICD-10-CM | POA: Diagnosis not present

## 2018-04-20 DIAGNOSIS — R69 Illness, unspecified: Secondary | ICD-10-CM | POA: Diagnosis not present

## 2018-04-28 DIAGNOSIS — R69 Illness, unspecified: Secondary | ICD-10-CM | POA: Diagnosis not present

## 2018-05-07 DIAGNOSIS — M1711 Unilateral primary osteoarthritis, right knee: Secondary | ICD-10-CM | POA: Diagnosis not present

## 2018-05-07 DIAGNOSIS — M25561 Pain in right knee: Secondary | ICD-10-CM | POA: Diagnosis not present

## 2018-05-17 DIAGNOSIS — M25561 Pain in right knee: Secondary | ICD-10-CM | POA: Diagnosis not present

## 2018-05-25 ENCOUNTER — Telehealth: Payer: Self-pay | Admitting: Cardiology

## 2018-05-25 NOTE — Telephone Encounter (Signed)
Patients son Damar Petit called and stated "brother is doing fine and doesn't need an appointment now", per C. Carlton.

## 2018-06-07 DIAGNOSIS — D485 Neoplasm of uncertain behavior of skin: Secondary | ICD-10-CM | POA: Diagnosis not present

## 2018-06-07 DIAGNOSIS — D1801 Hemangioma of skin and subcutaneous tissue: Secondary | ICD-10-CM | POA: Diagnosis not present

## 2018-06-07 DIAGNOSIS — Z85828 Personal history of other malignant neoplasm of skin: Secondary | ICD-10-CM | POA: Diagnosis not present

## 2018-06-07 DIAGNOSIS — L570 Actinic keratosis: Secondary | ICD-10-CM | POA: Diagnosis not present

## 2018-06-07 DIAGNOSIS — L821 Other seborrheic keratosis: Secondary | ICD-10-CM | POA: Diagnosis not present

## 2018-06-15 DIAGNOSIS — H52203 Unspecified astigmatism, bilateral: Secondary | ICD-10-CM | POA: Diagnosis not present

## 2018-06-15 DIAGNOSIS — Z961 Presence of intraocular lens: Secondary | ICD-10-CM | POA: Diagnosis not present

## 2018-06-17 DIAGNOSIS — S83231D Complex tear of medial meniscus, current injury, right knee, subsequent encounter: Secondary | ICD-10-CM | POA: Diagnosis not present

## 2018-06-17 DIAGNOSIS — M1711 Unilateral primary osteoarthritis, right knee: Secondary | ICD-10-CM | POA: Diagnosis not present

## 2018-06-17 DIAGNOSIS — M25561 Pain in right knee: Secondary | ICD-10-CM | POA: Diagnosis not present

## 2018-07-22 ENCOUNTER — Encounter: Payer: Self-pay | Admitting: Gastroenterology

## 2018-07-27 DIAGNOSIS — S83231A Complex tear of medial meniscus, current injury, right knee, initial encounter: Secondary | ICD-10-CM | POA: Diagnosis not present

## 2018-07-27 DIAGNOSIS — M23231 Derangement of other medial meniscus due to old tear or injury, right knee: Secondary | ICD-10-CM | POA: Diagnosis not present

## 2018-07-27 DIAGNOSIS — G8918 Other acute postprocedural pain: Secondary | ICD-10-CM | POA: Diagnosis not present

## 2018-08-05 DIAGNOSIS — G4730 Sleep apnea, unspecified: Secondary | ICD-10-CM | POA: Diagnosis not present

## 2018-08-05 DIAGNOSIS — J309 Allergic rhinitis, unspecified: Secondary | ICD-10-CM | POA: Diagnosis not present

## 2018-08-05 DIAGNOSIS — N529 Male erectile dysfunction, unspecified: Secondary | ICD-10-CM | POA: Diagnosis not present

## 2018-08-05 DIAGNOSIS — Z833 Family history of diabetes mellitus: Secondary | ICD-10-CM | POA: Diagnosis not present

## 2018-08-05 DIAGNOSIS — Z823 Family history of stroke: Secondary | ICD-10-CM | POA: Diagnosis not present

## 2018-08-05 DIAGNOSIS — E039 Hypothyroidism, unspecified: Secondary | ICD-10-CM | POA: Diagnosis not present

## 2018-08-05 DIAGNOSIS — M1990 Unspecified osteoarthritis, unspecified site: Secondary | ICD-10-CM | POA: Diagnosis not present

## 2018-08-10 DIAGNOSIS — S83249A Other tear of medial meniscus, current injury, unspecified knee, initial encounter: Secondary | ICD-10-CM | POA: Insufficient documentation

## 2018-08-30 DIAGNOSIS — C44519 Basal cell carcinoma of skin of other part of trunk: Secondary | ICD-10-CM | POA: Diagnosis not present

## 2018-09-10 IMAGING — DX DG KNEE COMPLETE 4+V*R*
4 series · 4 of 4 positions shown · non-contrast
Comparison: None.

CLINICAL DATA: Acute knee pain, reportedly RIGHT.

EXAM:
RIGHT KNEE - COMPLETE 4+ VIEW

[knee ap (1 of 3)]
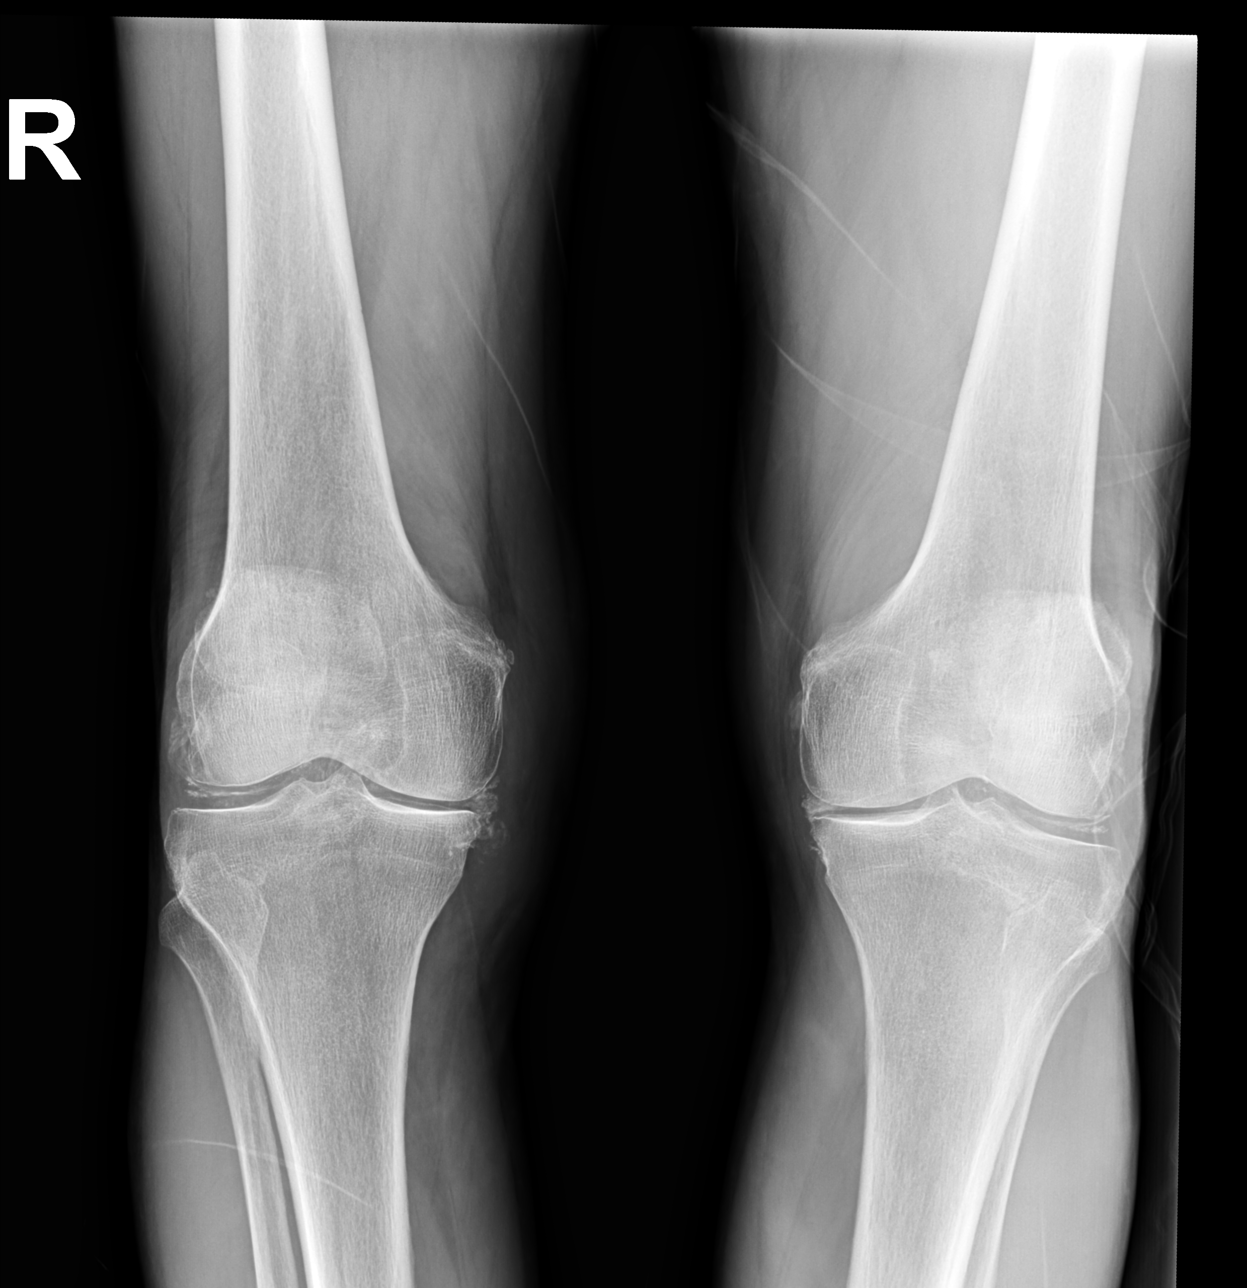

[knee lat]
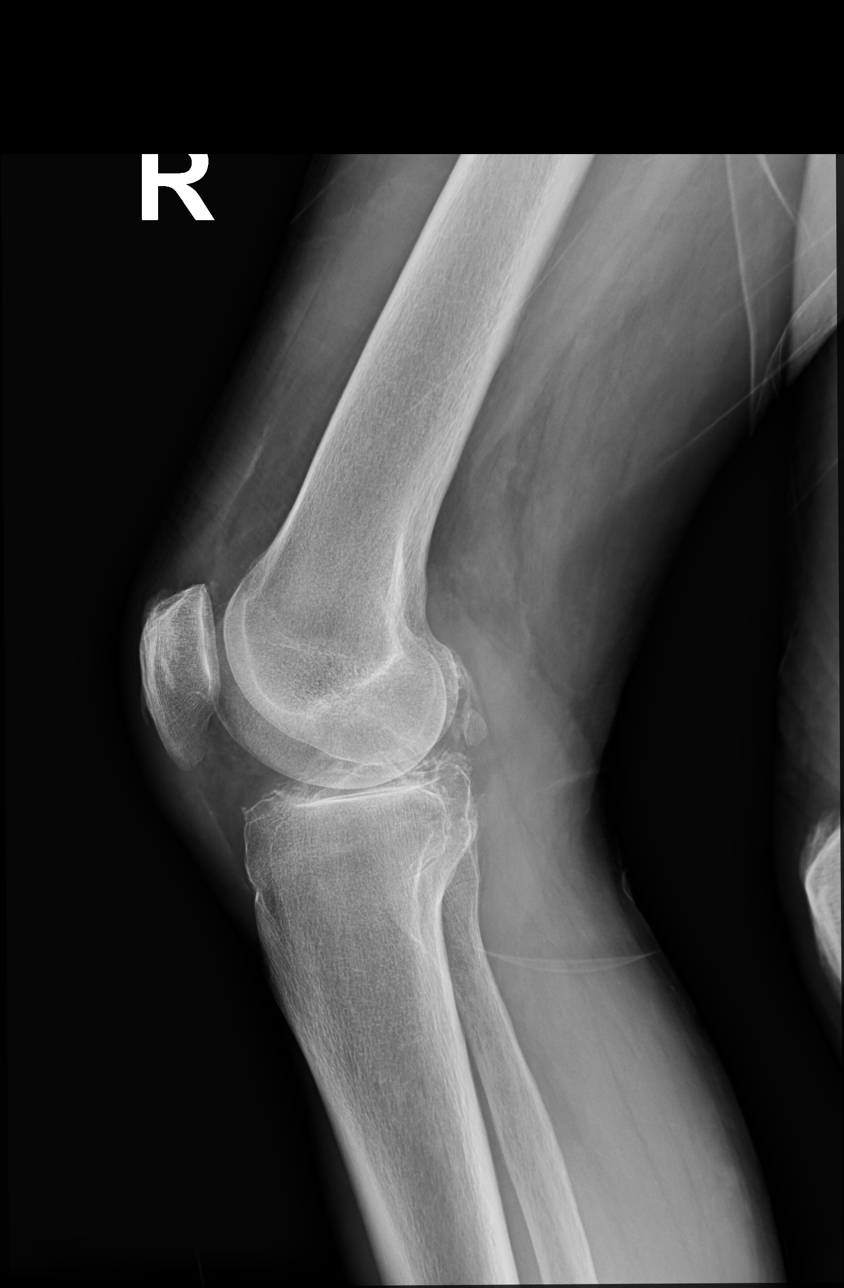

[knee ap (2 of 3)]
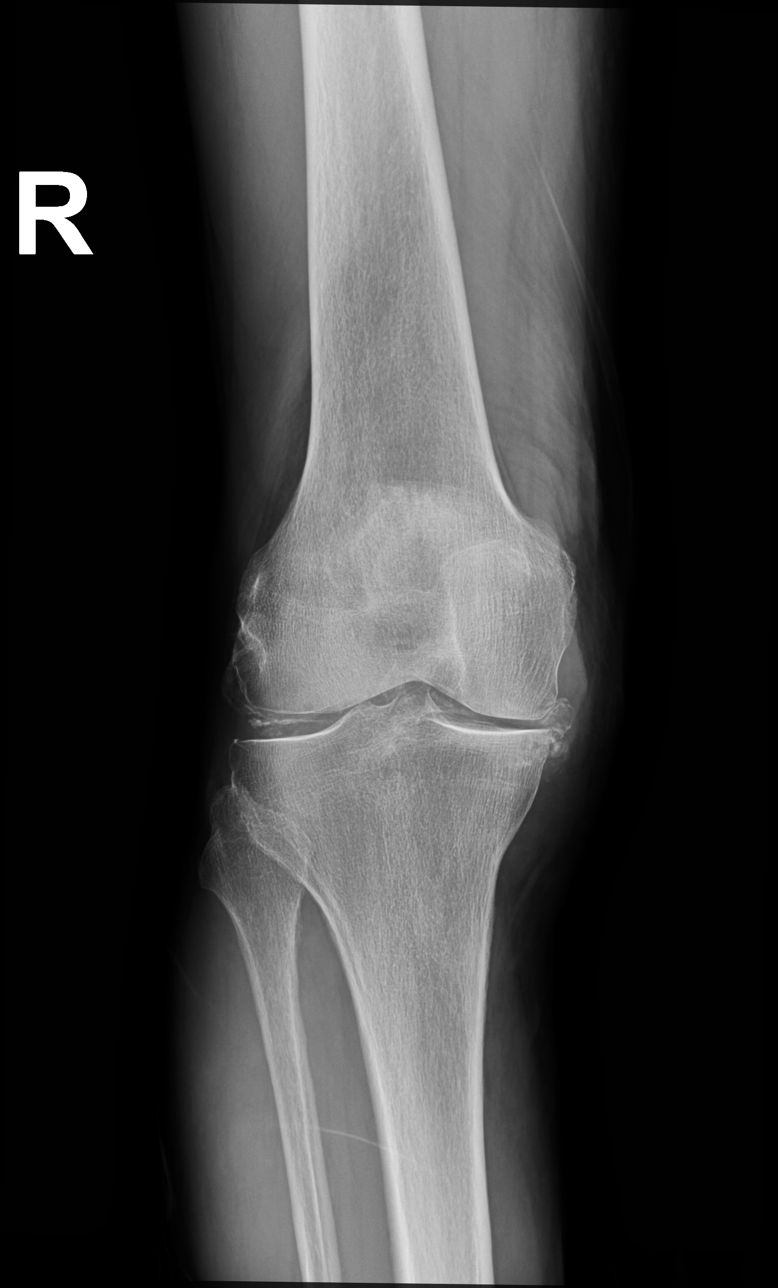

[knee ap (3 of 3)]
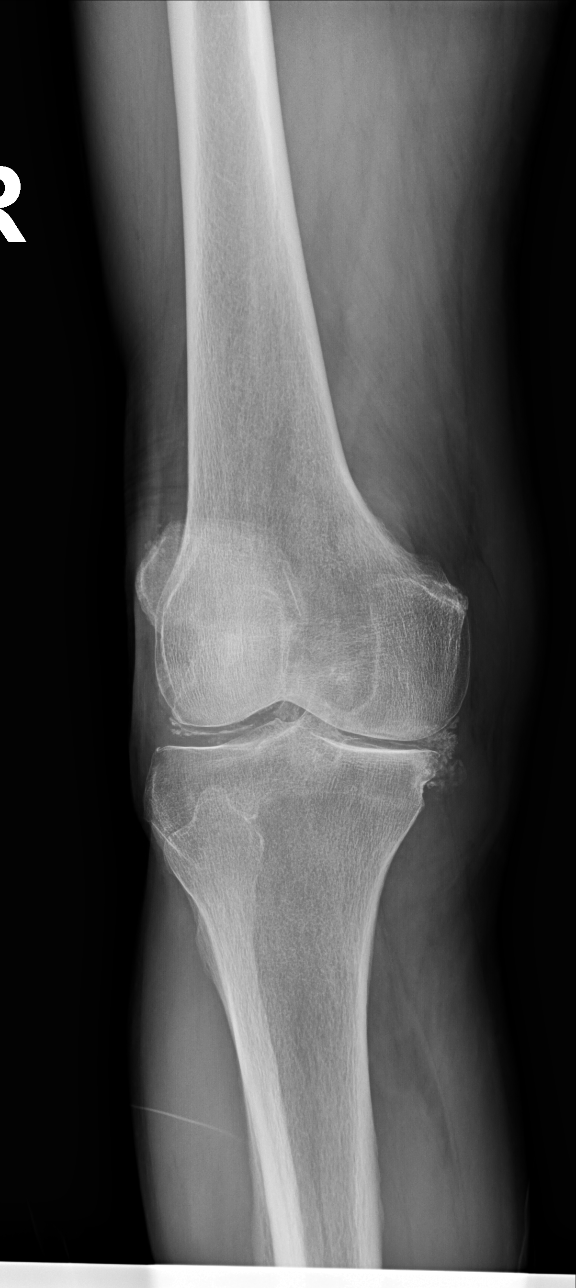

[4 of 4 positions shown; findings below may reference images not displayed]

FINDINGS: Tricompartmental joint space narrowing with cartilage calcification
is noted. No RIGHT-sided joint effusion is identified. There are no
acute osseous findings.
IMPRESSION: Tricompartmental   knee DJD.  No RIGHT effusion is evident.

## 2018-12-20 DIAGNOSIS — E039 Hypothyroidism, unspecified: Secondary | ICD-10-CM | POA: Diagnosis not present

## 2018-12-20 DIAGNOSIS — E069 Thyroiditis, unspecified: Secondary | ICD-10-CM | POA: Diagnosis not present

## 2019-01-04 DIAGNOSIS — T1511XA Foreign body in conjunctival sac, right eye, initial encounter: Secondary | ICD-10-CM | POA: Diagnosis not present

## 2019-03-23 ENCOUNTER — Encounter: Payer: Self-pay | Admitting: Family Medicine

## 2019-03-23 ENCOUNTER — Other Ambulatory Visit: Payer: Self-pay

## 2019-03-23 ENCOUNTER — Ambulatory Visit (INDEPENDENT_AMBULATORY_CARE_PROVIDER_SITE_OTHER): Payer: Medicare HMO | Admitting: Family Medicine

## 2019-03-23 VITALS — Ht 65.0 in | Wt 157.0 lb

## 2019-03-23 DIAGNOSIS — E559 Vitamin D deficiency, unspecified: Secondary | ICD-10-CM | POA: Diagnosis not present

## 2019-03-23 DIAGNOSIS — E063 Autoimmune thyroiditis: Secondary | ICD-10-CM | POA: Diagnosis not present

## 2019-03-23 DIAGNOSIS — I480 Paroxysmal atrial fibrillation: Secondary | ICD-10-CM

## 2019-03-23 DIAGNOSIS — Z9889 Other specified postprocedural states: Secondary | ICD-10-CM

## 2019-03-23 DIAGNOSIS — Z8719 Personal history of other diseases of the digestive system: Secondary | ICD-10-CM

## 2019-03-23 DIAGNOSIS — R7989 Other specified abnormal findings of blood chemistry: Secondary | ICD-10-CM

## 2019-03-23 DIAGNOSIS — E119 Type 2 diabetes mellitus without complications: Secondary | ICD-10-CM | POA: Diagnosis not present

## 2019-03-23 DIAGNOSIS — Z0000 Encounter for general adult medical examination without abnormal findings: Secondary | ICD-10-CM

## 2019-03-23 DIAGNOSIS — E785 Hyperlipidemia, unspecified: Secondary | ICD-10-CM | POA: Diagnosis not present

## 2019-03-23 HISTORY — DX: Personal history of other diseases of the digestive system: Z87.19

## 2019-03-23 NOTE — Progress Notes (Signed)
Virtual Visit via Telephone Note  I connected with Ronald Kelley on 03/23/19 at  9:15 AM EDT by telephone and verified that I am speaking with the correct person using two identifiers.  This visit type was conducted due to national recommendations for restrictions regarding the COVID-19 Pandemic (e.g. social distancing) in an effort to limit this patient's exposure and mitigate transmission in our community.  Due to her co-morbid illnesses, this patient is at least at moderate risk for complications without adequate follow up.  This format is felt to be most appropriate for this patient at this time.  The patient did not have access to video technology/had technical difficulties with video requiring transitioning to audio format only (telephone).  No physical exam could be performed with this format, beyond that communicated to [place] by the patient/ family members as noted.  Additionally my staff members also discussed with the patient that there may be a patient charge related to this service.   The patient expressed understanding, and agreed to proceed.       Subjective:   Ronald Kelley is a 78 y.o. male who presents for Medicare Annual/Subsequent preventive examination.  -Patient states he is just having hearing difficulties.  He tells me that this is just age-related and a normal process but it is not so bad he feels he needs to get it checked out and or wear a hearing aid at this time. -Colonoscopy- last had it in 2009 was told to follow-up in 10 years which is after he turns 89. -Patient refuses shingles, tetanus and pneumonia vaccines today.   Patient has chronic and acute concerns today in addition to the Medicare wellness part:  -Diet controlled DM: 6.6 a1c on 9/18.  Patient has been lost to follow-up.  He understood this should have been checked every 6 months and he did not return to the clinic as instructed.  Patient states that his FBS this am was 124, prior blood sugars  patient recalls they have been running 134, 133.   He checks it once or twice monthly or less seldomly.    Was taking cinnamon daily- not now though-he states he has been too inconsistent.   PT walks 1-3 times monthly, again prior use to walk much more often.  Patient states his weight has increased a little from prior because of these changes  Acute complaints: Patient complains of some bilateral lower groin pain that is been coming and going for the last couple of weeks.   That started after he had been clearing brush in his yard.   pt was 78 yrs old and had double hernia surgery.   Pain at times on L, then other times R.   Comes and goes.  Per patient it is not bad or excruciating and he has no accompanying symptoms.  He is wondering today if he needs a surgical consult or what he needs to do.   Bp at home/ doctor's appt- 118/68 and right around there.   Arthritis in his knees: Well controlled.  Patient mentions today about his arthritis in his knees.  Patient had seen Dr. Maureen Ralphs in the recent past.   Had arthroscopy in the past year by him.  If he does too many stairs it bothers him otherwise he is doing okay  Hypothyroidism: Patient has been getting his Synthroid from Dr. [name] of endocrinology.  He tells me he denies any symptoms of hypothyroidism or hyperthyroidism.  I explained how important  this is to check and keep an eye and especially due to his history of atypical a flutter.         Objective:    Vitals: Ht 5\' 5"  (1.651 m)   Wt 157 lb (71.2 kg)   BMI 26.13 kg/m   Body mass index is 26.13 kg/m.   Advanced Directives 08/01/2017 07/31/2017 09/24/2016  Does Patient Have a Medical Advance Directive? No No Yes  Type of Advance Directive - - [place]  Does patient want to make changes to medical advance directive? - - No - Patient declined  Copy of [place] in Chart? - - No - copy requested  Would patient like information on creating a medical  advance directive? - Yes (ED - Information included in AVS) -    Tobacco Social History   Tobacco Use  Smoking Status Never Smoker  Smokeless Tobacco Never Used      Counseling given: Not Answered   Past Medical History:  Diagnosis Date  . Atrial fib/flutter, transient   . Degenerative disk disease   . Diabetes mellitus    a1c 6.5, 07/2008  . Hyperthyroidism    remote h/o , no ablation  . OSA on CPAP     Past Surgical History:  Procedure Laterality Date  . HERNIA REPAIR  2010  . 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 ROTATOR CUFF REPAIR      Family History  Problem Relation Age of Onset  . Hypertension Mother   . Diabetes Brother   . Alzheimer's disease Father 13  . Coronary artery disease Neg Hx   . Stroke Neg Hx   . Colon cancer Neg Hx   . Prostate cancer Neg Hx     Social History   Socioeconomic History  . Marital status: Married    Spouse name: Not on file  . Number of children: 3  . Years of education: Not on file  . Highest education level: Not on file  Occupational History  . Occupation: Technical sales engineer-- semi-retirement  Social Needs  . Financial resource strain: Not on file  . Food insecurity:    Worry: Not on file    Inability: Not on file  . Transportation needs:    Medical: Not on file    Non-medical: Not on file  Tobacco Use  . Smoking status: Never Smoker  . Smokeless tobacco: Never Used  Substance and Sexual Activity  . Alcohol use: No  . Drug use: No  . Sexual activity: Not on file  Lifestyle  . Physical activity:    Days per week: Not on file    Minutes per session: Not on file  . Stress: Not on file  Relationships  . Social connections:    Talks on phone: Not on file    Gets together: Not on file    Attends religious service: Not on file    Active member of club or organization: Not on file    Attends meetings of clubs or organizations: Not on file     Relationship status: Not on file  Other Topics Concern  . Not on file  Social History Narrative   Married to [facility]        Outpatient Encounter Medications as of 03/23/2019  Medication Sig  . CINNAMON PO Take 2 tablets by mouth daily.   Marland Kitchen ibuprofen (ADVIL,MOTRIN) 200 MG tablet Take 200 mg  by mouth every 6 (six) hours as needed.  Marland Kitchen levothyroxine (SYNTHROID) 50 MCG tablet Take 50 mcg by mouth daily before breakfast.  . TURMERIC CURCUMIN PO Take 1 tablet by mouth daily.  . [DISCONTINUED] cholecalciferol (VITAMIN D) 1000 units tablet Take 1,000 Units by mouth daily.  . [DISCONTINUED] Cholecalciferol (VITAMIN D3) 5000 units TABS 5,000 IU OTC vitamin D3 daily.  . [DISCONTINUED] levothyroxine (SYNTHROID, LEVOTHROID) 88 MCG tablet Take 88 mcg by mouth daily before breakfast.  . [DISCONTINUED] magnesium 30 MG tablet Take 1 tablet (30 mg total) by mouth 2 (two) times daily.   No facility-administered encounter medications on file as of 03/23/2019.      Activities of Daily Living In your present state of health, do you have any difficulty performing the following activities: 03/23/2019  Hearing? Y  Vision? N  Difficulty concentrating or making decisions? N  Walking or climbing stairs? N  Dressing or bathing? N  Doing errands, shopping? N  Some recent data might be hidden    Activities of Daily Living In your present state of health, do you have difficulty performing the following activities?  1- Driving - no 2- Managing money - no 3- Feeding yourself - no 4- Getting from the bed to the chair - no 5- Climbing a flight of stairs - no 6- Preparing food and eating - no 7- Bathing or showering - no 8- Getting dressed - no 9- Getting to the toilet - no 10- Using the toilet - no 11- Moving around from place to place - no  Patient states that he feels safe at home.   Patient Care Team: [name], DO as PCP - General (Family Medicine) Clance, Armando Reichert, MD as Referring Physician  (Pulmonary Disease) Kristeen Miss, MD as Consulting Physician (Neurosurgery) Milus Banister, MD as Attending Physician (Gastroenterology) Festus Aloe, MD as Consulting Physician (Urology) Delrae Rend, MD as Consulting Physician (Endocrinology) Gaynelle Arabian, MD as Consulting Physician (Orthopedic Surgery)      Assessment:   This is a routine wellness examination for Ronald Kelley.   Exercise Activities and Dietary recommendations Current Exercise Habits: Home exercise routine, Type of exercise: walking, Frequency (Times/Week): 7, Intensity: Moderate   Goals   None     Fall Risk Fall Risk  03/23/2019 03/22/2018 11/05/2017 10/12/2017 07/27/2017  Falls in the past year? 1 Yes Yes No Yes  Comment - - near falls - -  Number falls in past yr: 0 1 - - 1  Injury with Fall? 0 - No - Yes  Risk for fall due to : - History of fall(s) - - -  Follow up Falls evaluation completed Falls evaluation completed - - -    Is the patient's home free of loose throw rugs in walkways, pet beds, electrical cords, etc?   yes      Grab bars in the bathroom? yes      Handrails on the stairs?   yes      Adequate lighting?   yes   Timed Get Up and Go Performed:  yes   Depression Screen PHQ 2/9 Scores 03/23/2019 03/22/2018 11/05/2017 10/12/2017  PHQ - 2 Score 0 0 0 0  PHQ- 9 Score 0 2 0 -   Depression screen [facility] 2/9 03/23/2019 03/22/2018 11/05/2017  Decreased Interest 0 0 0  Down, Depressed, Hopeless 0 0 0  PHQ - 2 Score 0 0 0  Altered sleeping 0 0 0  Tired, decreased energy 0 1 0  Change in appetite 0  1 0  Feeling bad or failure about yourself  0 0 0  Trouble concentrating 0 0 0  Moving slowly or fidgety/restless 0 0 0  Suicidal thoughts 0 0 -  PHQ-9 Score 0 2 0  Difficult doing work/chores Not difficult at all Not difficult at all -    Cognitive Function     6CIT Screen 03/23/2019  What Year? 0 points  What month? 0 points  What time? 0 points  Count back from 20 0 points  Months in  reverse 0 points  Repeat phrase 0 points  Total Score 0    Immunization History  Administered Date(s) Administered  . Pneumococcal Polysaccharide-23 06/23/2008  . Td 06/23/2008    Qualifies for Shingles Vaccine? Patient declined   Screening Tests Health Maintenance  Topic Date Due  . HEMOGLOBIN A1C  05/02/2019 (Originally 02/03/2018)  . TETANUS/TDAP  03/22/2020 (Originally 06/23/2018)  . PNA vac Low Risk Adult (2 of 2 - PCV13) 03/22/2020 (Originally 06/23/2009)  . INFLUENZA VACCINE  07/02/2019  . FOOT EXAM  Discontinued  . OPHTHALMOLOGY EXAM  Discontinued  . URINE MICROALBUMIN  Discontinued     Cancer Screenings:  Lung: Low Dose CT Chest recommended if Age 11-80 years, 30 pack-year currently smoking OR have quit w/in 15years. Patient does not qualify.  Colorectal: 08/08/2008    Additional Screenings: --> Patient refuses shingles vaccine, tetanus vaccine and pneumonia vaccine.        Plan:       1. Hashimoto's thyroiditis  Pt hasn't had labs done since 08/2017.  Patient has been getting his Synthroid from Dr. [name] an endocrinologist.   Told him in the future if he like to get his Synthroid through [place], I would be comfortable doing it but only after we get a set of labs on him, and he would need this monitored yearly.  No refill of his Synthroid until he gets these labs  -Per patient he tells me he will schedule this in the near future.  I explained the importance of this especially with his history of atypical atrial flutter and that having too much Synthroid can cause him to go into this very easily.  - T4, free; Future - TSH; Future    4. Low TSH level  - T4, free; Future - TSH; Future - T3, Free; future   5. Elevated serum free T4 level  -Thyroid labs per above    2. Diet-controlled diabetes mellitus ([place])  -Importance of checking his blood sugars fasting especially after eating healthy the night before and poorly the night before discussed with  patient.  Importance of keeping weight down, prudent diet and exercise was all discussed with patient today.  Importance of monitoring and having yearly eye exam as well as urine microalbumin to creatinine ratio was also discussed with patient today.  Extensive counseling about this done and importance of routine follow-up of this chronic condition stressed to patient  - Comprehensive metabolic panel; Future - CBC with Differential/Platelet; Future - Hemoglobin A1c; Future - Lipid panel; Future - B12; Future   3. Hyperlipidemia, unspecified hyperlipidemia type  -Again discussed the importance of low saturated trans-fat diet, regular exercise etc. -Importance of routine monitoring discussed with patient as well.  - Comprehensive metabolic panel; Future - CBC with Differential/Platelet; Future - Lipid panel; Future   6. Vitamin D deficiency  -Importance of adequate levels vitamin D with someone his age and stature stressed to patient. -Educated patient about importance of monitoring  - VITAMIN D  25 Hydroxy (Vit-D Deficiency, Fractures); Future   7. History of inguinal hernia repair, bilateral  -Red flag symptoms discussed with patient and he knows if he develops any of those he will notify us immediately or, if they are abrupt in onset and intense in symptoms, he will proceed to the emergency room -Discussed with patient if symptoms worsen or become more bothersome, he should start with an office visit here and full evaluation by [place] with exam      ->  [employer] wellness part of today's OV  8. Encounter for Medicare annual wellness exam  - Highly, highly recommend patient come in to get his pneumonia vaccines as well as flu vaccine this fall.  Had extensive discussion regarding covid-19 as well as importance of having these immunizations.  -Highly recommended patient come in for tetanus vaccine as he is due and also I recommended he get the shingles vaccine but patient declined  all 3 of these today.  -Patient declined referral for evaluation for hearing aids.  He also declined coming into get a hearing test here and in the office   I have personally reviewed and noted the following in the patient's chart:   . Medical and social history . Use of alcohol, tobacco or illicit drugs  . Current medications and supplements . Functional ability and status . Nutritional status . Physical activity . Advanced directives . List of other physicians . Hospitalizations, surgeries, and ER visits in previous 12 months . Vitals . Screenings to include cognitive, depression, and falls . Referrals and appointments  In addition, I have reviewed and discussed with patient certain preventive protocols, quality metrics, and best practice recommendations. A written personalized care plan for preventive services as well as general preventive health recommendations were provided to patient.   I discussed the assessment and treatment plan with the patient. The patient was provided an opportunity to ask questions and all were answered. The patient agreed with the plan and demonstrated an understanding of the instructions.   The patient was advised to call back or seek an in-person evaluation if the symptoms worsen or if the condition fails to improve as anticipated.  I provided 25 minutes 37 sec of non-face-to-face time during this encounter, with over 50% of the time in direct counseling on patients medical conditions/ concerns.  Additional time was spent with charting and coordination of care after the actual visit commenced.    [name], DO

## 2019-05-13 DIAGNOSIS — M25561 Pain in right knee: Secondary | ICD-10-CM | POA: Diagnosis not present

## 2019-06-14 DIAGNOSIS — M25551 Pain in right hip: Secondary | ICD-10-CM | POA: Diagnosis not present

## 2019-06-14 DIAGNOSIS — M25561 Pain in right knee: Secondary | ICD-10-CM | POA: Diagnosis not present

## 2019-06-16 DIAGNOSIS — M25551 Pain in right hip: Secondary | ICD-10-CM | POA: Diagnosis not present

## 2019-07-08 DIAGNOSIS — M25561 Pain in right knee: Secondary | ICD-10-CM | POA: Diagnosis not present

## 2019-07-08 DIAGNOSIS — M25551 Pain in right hip: Secondary | ICD-10-CM | POA: Diagnosis not present

## 2019-07-08 DIAGNOSIS — S32591D Other specified fracture of right pubis, subsequent encounter for fracture with routine healing: Secondary | ICD-10-CM | POA: Diagnosis not present

## 2019-07-27 ENCOUNTER — Other Ambulatory Visit: Payer: Self-pay | Admitting: Family Medicine

## 2019-07-27 NOTE — Telephone Encounter (Signed)
Patient called states he had been getting his thyroid meds filled by Dr. Percival Spanish but Patient now request Dr. Raliegh Scarlet manage this prescription  -- Patient has been out of for 2 dys.  levothyroxine (SYNTHROID) 50 MCG tablet [id number]   Order Details Dose: 50 mcg Route: Oral Frequency: Daily before breakfast  Dispense Quantity: -- Refills: -- Fills remaining: --        Sig: Take 50 mcg by mouth daily before breakfast.          --Pls send to :   [facility] [place] [address] [phone] (Phone) [phone] (Fax)   --Dion Body

## 2019-07-27 NOTE — Telephone Encounter (Signed)
We have not prescribed these medications for the patient previously.  Please review and refill if appropriate.  T. Shaterria Sager, CMA  

## 2019-07-27 NOTE — Telephone Encounter (Signed)
We have not refill these medicines before because patient has gone to see his endocrinologist Dr. [name] for this.  This is who gives him these refills.   -Patient will have to contact Dr. [name] office for refills.

## 2019-07-27 NOTE — Telephone Encounter (Signed)
LVM informing pt to contact Dr. [name] for refills.  Charyl Bigger, CMA

## 2019-07-28 ENCOUNTER — Telehealth: Payer: Self-pay | Admitting: Family Medicine

## 2019-07-28 NOTE — Telephone Encounter (Signed)
I have no labs on patient since 2018.  He has been told repeatedly to come in and get them but he has not.  Also, prior, patient was being managed by his endocrinologist.  Only give 30-day supply of medicines with no refills.    He will need to come in to get a full set of fasting blood work in the very near future and then have a telehealth visit to discuss this blood work with me 3 days later.  Thank you.

## 2019-07-28 NOTE — Telephone Encounter (Signed)
Please see new information regarding refills for thyroid medication.  Charyl Bigger, CMA

## 2019-07-28 NOTE — Telephone Encounter (Signed)
Patient called back and left VM stating that Dr. [name] does not need to f/u up with him except once a year and recommenced that PCP take over refills of thyroid med. Patient wishes for a call back to discuss this issue.

## 2019-08-01 MED ORDER — LEVOTHYROXINE SODIUM 50 MCG PO TABS: 50.0000 ug | ORAL_TABLET | Freq: Every day | ORAL | 0 refills | Status: DC

## 2019-08-01 NOTE — Telephone Encounter (Signed)
Please call pt to schedule FBW and then telemedicine visit 2-3 days with Dr. Raliegh Scarlet.  ABSOLUTELY NO FURTHER REFILLS UNTIL PT HAS LABS AND F/U WITH DR. Dustin Folks, CMA

## 2019-08-01 NOTE — Addendum Note (Signed)
Addended by: Fonnie Mu on: 08/01/2019 11:47 AM   Modules accepted: Orders

## 2019-08-16 ENCOUNTER — Telehealth: Payer: Self-pay | Admitting: Family Medicine

## 2019-08-16 NOTE — Telephone Encounter (Signed)
Called pt to set up provider required Labs & Appt : ( left voicemail since no answer at listed hm#)   Please call pt to schedule FBW and then telemedicine visit 2-3 days with Dr. Raliegh Scarlet.  ABSOLUTELY NO FURTHER REFILLS UNTIL PT HAS LABS AND F/U WITH DR. Dustin Folks, CMA  --FYI to medical assistant.  -glh

## 2019-08-18 ENCOUNTER — Other Ambulatory Visit: Payer: Medicare HMO

## 2019-08-18 DIAGNOSIS — R7989 Other specified abnormal findings of blood chemistry: Secondary | ICD-10-CM | POA: Diagnosis not present

## 2019-08-18 DIAGNOSIS — E119 Type 2 diabetes mellitus without complications: Secondary | ICD-10-CM

## 2019-08-18 DIAGNOSIS — E559 Vitamin D deficiency, unspecified: Secondary | ICD-10-CM | POA: Diagnosis not present

## 2019-08-18 DIAGNOSIS — E785 Hyperlipidemia, unspecified: Secondary | ICD-10-CM | POA: Diagnosis not present

## 2019-08-18 DIAGNOSIS — E063 Autoimmune thyroiditis: Secondary | ICD-10-CM

## 2019-08-18 DIAGNOSIS — R531 Weakness: Secondary | ICD-10-CM | POA: Diagnosis not present

## 2019-08-18 DIAGNOSIS — I480 Paroxysmal atrial fibrillation: Secondary | ICD-10-CM | POA: Diagnosis not present

## 2019-08-19 LAB — COMPREHENSIVE METABOLIC PANEL
ALT: 20 IU/L (ref 0–44)
AST: 15 IU/L (ref 0–40)
Albumin/Globulin Ratio: 1.8 (ref 1.2–2.2)
Albumin: 4.2 g/dL (ref 3.7–4.7)
Alkaline Phosphatase: 103 IU/L (ref 39–117)
BUN/Creatinine Ratio: 17 (ref 10–24)
BUN: 21 mg/dL (ref 8–27)
Bilirubin Total: 1 mg/dL (ref 0.0–1.2)
CO2: 23 mmol/L (ref 20–29)
Calcium: 11.8 mg/dL — ABNORMAL HIGH (ref 8.6–10.2)
Chloride: 105 mmol/L (ref 96–106)
Creatinine, Ser: 1.24 mg/dL (ref 0.76–1.27)
GFR calc Af Amer: 64 mL/min/{1.73_m2} (ref >59)
GFR calc non Af Amer: 55 mL/min/{1.73_m2} — ABNORMAL LOW (ref >59)
Globulin, Total: 2.4 g/dL (ref 1.5–4.5)
Glucose: 163 mg/dL — ABNORMAL HIGH (ref 65–99)
Potassium: 4.2 mmol/L (ref 3.5–5.2)
Sodium: 142 mmol/L (ref 134–144)
Total Protein: 6.6 g/dL (ref 6.0–8.5)

## 2019-08-19 LAB — LIPID PANEL
Chol/HDL Ratio: 3.8 ratio (ref 0.0–5.0)
Cholesterol, Total: 146 mg/dL (ref 100–199)
HDL: 38 mg/dL — ABNORMAL LOW (ref >39)
LDL Chol Calc (NIH): 82 mg/dL (ref 0–99)
Triglycerides: 148 mg/dL (ref 0–149)
VLDL Cholesterol Cal: 26 mg/dL (ref 5–40)

## 2019-08-19 LAB — MICROALBUMIN / CREATININE URINE RATIO
Creatinine, Urine: 262.9 mg/dL
Microalb/Creat Ratio: 13 mg/g creat (ref 0–29)
Microalbumin, Urine: 34.6 ug/mL

## 2019-08-19 LAB — CBC WITH DIFFERENTIAL/PLATELET
Basophils Absolute: 0.1 10*3/uL (ref 0.0–0.2)
Basos: 1 %
EOS (ABSOLUTE): 0.2 10*3/uL (ref 0.0–0.4)
Eos: 2 %
Hematocrit: 43.7 % (ref 37.5–51.0)
Hemoglobin: 14.6 g/dL (ref 13.0–17.7)
Immature Grans (Abs): 0 10*3/uL (ref 0.0–0.1)
Immature Granulocytes: 0 %
Lymphocytes Absolute: 3 10*3/uL (ref 0.7–3.1)
Lymphs: 35 %
MCH: 30.4 pg (ref 26.6–33.0)
MCHC: 33.4 g/dL (ref 31.5–35.7)
MCV: 91 fL (ref 79–97)
Monocytes Absolute: 0.7 10*3/uL (ref 0.1–0.9)
Monocytes: 9 %
Neutrophils Absolute: 4.5 10*3/uL (ref 1.4–7.0)
Neutrophils: 53 %
Platelets: 144 10*3/uL — ABNORMAL LOW (ref 150–450)
RBC: 4.81 x10E6/uL (ref 4.14–5.80)
RDW: 12.5 % (ref 11.6–15.4)
WBC: 8.5 10*3/uL (ref 3.4–10.8)

## 2019-08-19 LAB — VITAMIN D 25 HYDROXY (VIT D DEFICIENCY, FRACTURES): Vit D, 25-Hydroxy: 24.8 ng/mL — ABNORMAL LOW (ref 30.0–100.0)

## 2019-08-19 LAB — HEMOGLOBIN A1C
Est. average glucose Bld gHb Est-mCnc: 143 mg/dL
Hgb A1c MFr Bld: 6.6 % — ABNORMAL HIGH (ref 4.8–5.6)

## 2019-08-19 LAB — T3, FREE: T3, Free: 2.9 pg/mL (ref 2.0–4.4)

## 2019-08-19 LAB — VITAMIN B12: Vitamin B-12: 406 pg/mL (ref 232–1245)

## 2019-08-19 LAB — TSH: TSH: 1.39 u[IU]/mL (ref 0.450–4.500)

## 2019-08-19 LAB — T4, FREE: Free T4: 1.28 ng/dL (ref 0.82–1.77)

## 2019-08-31 ENCOUNTER — Other Ambulatory Visit: Payer: Self-pay

## 2019-08-31 ENCOUNTER — Ambulatory Visit (INDEPENDENT_AMBULATORY_CARE_PROVIDER_SITE_OTHER): Payer: Medicare HMO | Admitting: Family Medicine

## 2019-08-31 ENCOUNTER — Encounter: Payer: Self-pay | Admitting: Family Medicine

## 2019-08-31 VITALS — Ht 65.0 in | Wt 158.0 lb

## 2019-08-31 DIAGNOSIS — I480 Paroxysmal atrial fibrillation: Secondary | ICD-10-CM

## 2019-08-31 DIAGNOSIS — E559 Vitamin D deficiency, unspecified: Secondary | ICD-10-CM

## 2019-08-31 DIAGNOSIS — R42 Dizziness and giddiness: Secondary | ICD-10-CM

## 2019-08-31 DIAGNOSIS — R531 Weakness: Secondary | ICD-10-CM

## 2019-08-31 DIAGNOSIS — J3089 Other allergic rhinitis: Secondary | ICD-10-CM | POA: Insufficient documentation

## 2019-08-31 DIAGNOSIS — E119 Type 2 diabetes mellitus without complications: Secondary | ICD-10-CM

## 2019-08-31 DIAGNOSIS — R7989 Other specified abnormal findings of blood chemistry: Secondary | ICD-10-CM

## 2019-08-31 DIAGNOSIS — E0590 Thyrotoxicosis, unspecified without thyrotoxic crisis or storm: Secondary | ICD-10-CM

## 2019-08-31 DIAGNOSIS — K219 Gastro-esophageal reflux disease without esophagitis: Secondary | ICD-10-CM | POA: Diagnosis not present

## 2019-08-31 DIAGNOSIS — K5900 Constipation, unspecified: Secondary | ICD-10-CM | POA: Diagnosis not present

## 2019-08-31 DIAGNOSIS — R142 Eructation: Secondary | ICD-10-CM | POA: Diagnosis not present

## 2019-08-31 DIAGNOSIS — T39315A Adverse effect of propionic acid derivatives, initial encounter: Secondary | ICD-10-CM | POA: Insufficient documentation

## 2019-08-31 DIAGNOSIS — E860 Dehydration: Secondary | ICD-10-CM

## 2019-08-31 DIAGNOSIS — I951 Orthostatic hypotension: Secondary | ICD-10-CM | POA: Insufficient documentation

## 2019-08-31 DIAGNOSIS — T39315S Adverse effect of propionic acid derivatives, sequela: Secondary | ICD-10-CM

## 2019-08-31 DIAGNOSIS — S329XXD Fracture of unspecified parts of lumbosacral spine and pelvis, subsequent encounter for fracture with routine healing: Secondary | ICD-10-CM

## 2019-08-31 MED ORDER — LEVOCETIRIZINE DIHYDROCHLORIDE 5 MG PO TABS: 5.0000 mg | ORAL_TABLET | Freq: Every evening | ORAL | 1 refills | Status: DC

## 2019-08-31 MED ORDER — VITAMIN D3 125 MCG (5000 UT) PO TABS: ORAL_TABLET | ORAL | 3 refills | Status: AC

## 2019-08-31 NOTE — Progress Notes (Signed)
Telehealth office visit note for Ronald Kelley, D.O- at Primary Care at [facility]   I connected with current patient today and verified that I am speaking with the correct person using two identifiers.   . Location of the patient: Home . Location of the provider: Office Only the patient (+/- their family members at pt's discretion) and myself were participating in the encounter    - This visit type was conducted due to national recommendations for restrictions regarding the COVID-19 Pandemic (e.g. social distancing) in an effort to limit this patient's exposure and mitigate transmission in our community.  This format is felt to be most appropriate for this patient at this time.   - The patient did not have access to video technology or had technical difficulties with video requiring transitioning to audio format only. - No physical exam could be performed with this format, beyond that communicated to [place] by the patient/ family members as noted.   - Additionally my office staff/ schedulers discussed with the patient that there may be a monetary charge related to this service, depending on their medical insurance.   The patient expressed understanding, and agreed to proceed.       History of Present Illness:  States he has not been checking his sugars like he should.  - Reported "Cracked Pelvis" Notes he has had two situations happen since the last time he spoke with the clinic; states "I rolled out of bed in July and ended up having a cracked pelvic."  Says he had a crazy dream and threw his leg up in the air and it "took me out of the bed" and onto the hardwood floor.  Walked for 4-5 weeks with a walker/crutches and didn't put weight on his right side.    - Episode of Acute Dizziness Then, this past Sunday, he got up in the middle of the night and was getting back in bed or getting out of bed to go to the bathroom, and "all of a sudden everything started spinning, and it was  bad, I mean it was bad."  Michela Pitcher he wasn't sure if he could make it from the bed to the bathroom without "falling into something."  Has tried to increase his water intake since then.  States he has continued to experience dizziness since but it has improved some since increasing his water intake.    Denies losing consciousness.  Says "the room was spinning."  Notes "it was a very uneasy feeling." States the room was spinning "all in one direction."  Confirms nausea with this event.    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  - History of Atrial Fibrillation, reported "episodes of Atrial Fibrillation" Says he thinks over the last several months he's "had some days where he felt like he's had atrial fibrillation."  Notes he "did not feel like he was having atrial fibrillation" during his episode of acute dizziness.  When his heart races, he states "I feel a weakness."  Says during these episodes "I just don't  feel normal."  These episodes can occur off and on throughout the day, 15 minute intervals, 3-4 times, and "don't last more than 30 minutes."   Usually drinks one cup of regular coffee and two cups of decaf, and "sometimes one doctor pepper."  Says "years ago they told me I had some kind of problem with my heart, and I disagreed with him."  - Constipation, Indigestion & Burping Patient notes that he "eats Tums" when he has indigestion and burping. Comments that he often has this "burping."  He wonders if low water intake can contribute to "difficulty passing bowels."    No flowsheet data found.  Depression screen [facility] 2/9  08/31/2019 03/23/2019 03/22/2018 11/05/2017 10/12/2017  Decreased Interest 1 0 0 0 0  Down, Depressed, Hopeless 0 0 0 0 0  PHQ - 2 Score 1 0 0 0 0  Altered sleeping 0 0 0 0 -  Tired, decreased energy 1 0 1 0 -  Change in appetite 0 0 1 0 -  Feeling bad or failure about yourself  0 0 0 0 -  Trouble concentrating 0 0 0 0 -  Moving slowly or fidgety/restless 0 0 0 0 -  Suicidal thoughts 0 0 0 - -  PHQ-9 Score 2 0 2 0 -  Difficult doing work/chores Not difficult at all Not difficult at all Not difficult at all - -   Recent Results (from the past 2160 hour(s))  Microalbumin / creatinine urine ratio     Status: None   Collection Time: 08/18/19  9:45 AM  Result Value Ref Range   Creatinine, Urine 262.9 Not Estab. mg/dL   Microalbumin, Urine 34.6 Not Estab. ug/mL   Microalb/Creat Ratio 13 0 - 29 mg/g creat    Comment:                        Normal:                0 -  29                        Moderately increased: 30 - 300                        Severely increased:       >300               **Please note reference interval change**   T3, free     Status: None   Collection Time: 08/18/19  9:45 AM  Result Value Ref Range   T3, Free 2.9 2.0 - 4.4 pg/mL  B12     Status: None   Collection Time: 08/18/19  9:45 AM  Result Value Ref Range   Vitamin B-12 406 232 - 1,245 pg/mL  VITAMIN D 25 Hydroxy (Vit-D Deficiency, Fractures)     Status: Abnormal   Collection Time: 08/18/19  9:45 AM  Result Value Ref Range   Vit D, 25-Hydroxy 24.8 (L) 30.0 - 100.0 ng/mL    Comment: Vitamin D deficiency has been defined by the [place] and an Endocrine Society practice guideline as a level of serum 25-OH vitamin D less than 20 ng/mL (1,2). The Endocrine Society went on to further define vitamin D insufficiency as a level between 21 and 29 ng/mL (2). 1. IOM (Institute of Medicine). 2010. Dietary reference    intakes for calcium and D. [place]: The  [employer]. 2. Holick MF,  Binkley Anthoston, Bischoff-Ferrari HA, et al.    Evaluation, treatment, and prevention of vitamin D    deficiency: an Endocrine Society clinical practice    guideline. JCEM. 2011 Jul; 96(7):1911-30.   TSH     Status: None   Collection Time: 08/18/19  9:45 AM  Result Value Ref Range   TSH 1.390 0.450 - 4.500 uIU/mL  T4, free     Status: None   Collection Time: 08/18/19  9:45 AM  Result Value Ref Range   Free T4 1.28 0.82 - 1.77 ng/dL  Lipid panel     Status: Abnormal   Collection Time: 08/18/19  9:45 AM  Result Value Ref Range   Cholesterol, Total 146 100 - 199 mg/dL   Triglycerides 148 0 - 149 mg/dL   HDL 38 (L) >39 mg/dL   VLDL Cholesterol Cal 26 5 - 40 mg/dL   LDL Chol Calc (NIH) 82 0 - 99 mg/dL   Chol/HDL Ratio 3.8 0.0 - 5.0 ratio    Comment:                                   T. Chol/HDL Ratio                                             Men  Women                               1/2 Avg.Risk  3.4    3.3                                   Avg.Risk  5.0    4.4                                2X Avg.Risk  9.6    7.1                                3X Avg.Risk 23.4   11.0   Hemoglobin A1c     Status: Abnormal   Collection Time: 08/18/19  9:45 AM  Result Value Ref Range   Hgb A1c MFr Bld 6.6 (H) 4.8 - 5.6 %    Comment:          Prediabetes: 5.7 - 6.4          Diabetes: >6.4          Glycemic control for adults with diabetes: <7.0    Est. average glucose Bld gHb Est-mCnc 143 mg/dL  CBC with Differential/Platelet     Status: Abnormal   Collection Time: 08/18/19  9:45 AM  Result Value Ref Range   WBC 8.5 3.4 - 10.8 x10E3/uL   RBC 4.81 4.14 - 5.80 x10E6/uL   Hemoglobin 14.6 13.0 - 17.7 g/dL   Hematocrit 43.7 37.5 - 51.0 %   MCV 91 79 - 97 fL   MCH 30.4 26.6 - 33.0 pg   MCHC 33.4 31.5 - 35.7 g/dL   RDW 12.5 11.6 - 15.4 %   Platelets  144 (L) 150 - 450 x10E3/uL   Neutrophils 53 Not Estab. %   Lymphs 35 Not Estab. %   Monocytes 9 Not Estab. %   Eos 2 Not Estab. %   Basos 1 Not Estab.  %   Neutrophils Absolute 4.5 1.4 - 7.0 x10E3/uL   Lymphocytes Absolute 3.0 0.7 - 3.1 x10E3/uL   Monocytes Absolute 0.7 0.1 - 0.9 x10E3/uL   EOS (ABSOLUTE) 0.2 0.0 - 0.4 x10E3/uL   Basophils Absolute 0.1 0.0 - 0.2 x10E3/uL   Immature Granulocytes 0 Not Estab. %   Immature Grans (Abs) 0.0 0.0 - 0.1 x10E3/uL  Comprehensive metabolic panel     Status: Abnormal   Collection Time: 08/18/19  9:45 AM  Result Value Ref Range   Glucose 163 (H) 65 - 99 mg/dL   BUN 21 8 - 27 mg/dL   Creatinine, Ser 1.24 0.76 - 1.27 mg/dL   GFR calc non Af Amer 55 (L) >59 mL/min/1.73   GFR calc Af Amer 64 >59 mL/min/1.73   BUN/Creatinine Ratio 17 10 - 24   Sodium 142 134 - 144 mmol/L   Potassium 4.2 3.5 - 5.2 mmol/L   Chloride 105 96 - 106 mmol/L   CO2 23 20 - 29 mmol/L   Calcium 11.8 (H) 8.6 - 10.2 mg/dL   Total Protein 6.6 6.0 - 8.5 g/dL   Albumin 4.2 3.7 - 4.7 g/dL   Globulin, Total 2.4 1.5 - 4.5 g/dL   Albumin/Globulin Ratio 1.8 1.2 - 2.2   Bilirubin Total 1.0 0.0 - 1.2 mg/dL   Alkaline Phosphatase 103 39 - 117 IU/L   AST 15 0 - 40 IU/L   ALT 20 0 - 44 IU/L       Impression and Recommendations:    1. Paroxysmal atrial fibrillation (HCC)   2. Dizziness on standing   3. Diet-controlled diabetes mellitus ([place])   4. Closed nondisplaced fracture of pelvis with routine healing, unspecified part of pelvis, subsequent encounter   5. Weakness generalized   6. Elevated serum creatinine   7. Serum calcium elevated   8. Mild dehydration   9. Constipation, unspecified constipation type   10. Functional burping disorder   11. Gastroesophageal reflux disease, esophagitis presence not specified   12. h/o Hyperthyroidism   13. Vitamin D deficiency   14. Environmental and seasonal allergies   15. Adverse effect of ibuprofen, sequela      Acute Episode of Dizziness on Standing/Vertigo - Discussed potential causes and symptoms of vertigo with patient today.  - Educated patient regarding red flag cardiac  symptoms.  Patient knows to obtain emergency care if he experiences any red flag cardiac symptoms.  - Advised patient that low water intake can contribute to feeling a little bit off-balance and dizzy.  - Will continue to monitor.  Seasonal & Environmental Allergies - Patient may also take a daily allergy pill to help reduce sx of dizziness due to allergies. - Prescription allergy medication provided today.  - Will continue to monitor.  Cardiac Health - History of Paroxysmal Atrial Fibrillation - Extensively discussed potential causes of patient's symptoms. - Discussed need for return to cardiology for further assessment and monitoring.  - Given patient's reported symptoms of heart flutter, ambulatory referral placed to cardiology today - Lengthy conversation held with patient.  Education provided and all questions answered.  - Will continue to monitor.  Reviewed recent lab work (08/18/2019) in depth with patient today.  All lab work within normal limits unless otherwise noted.  Extensive education provided and all questions answered.  Elevated Serum Calcium - Patient with elevated calcium at 11.8.  Advised patient to reduce use of Tums.   - Will re-check calcium in 6-8 weeks.  Hypothyroidism - Stable at this time. - Continue management as established.  See med list. - Will continue to monitor.  Kidney Health - Serum creatinine last check is 1.24 up from 0.90 two years prior. - Discussed critical need to preserve kidney health. - Advised increased hydration and physical activity to help maintain organ health.  - Repeat kidney panel in 6-8 weeks. Will continue to monitor.  Diet controlled Dabetes Mellitus - A1c is 6.6, unchanged from prior, stable over the past two years. - No changes made to treatment plan. - Continue management as established. - Will continue to monitor.  Hyperlipidemia associated with DM Last Lipid Panel  Triglycerides = 148 HDL = 38 LDL = 82  -  No changes made to treatment plan. - Continue management as established. - Advised patient to be sure to practice prudent low-cholesterol eating habits.  - Will continue to monitor.  Vitamin D Deficiency - 24.8 last check. - Advised goal of 40-60 range. - Advised patient that, especially given recent crack of pelvis, we need to increase Vitamin D supplementation.  Prescription Vitamin D provided today.  See med list.  - Will continue to monitor.  Recommendations - Advised patient to follow up in 3-4 months as recommended.  - As part of my medical decision making, I reviewed the following data within the [place] History obtained from pt /family, CMA notes reviewed and incorporated if applicable, Labs reviewed, Radiograph/ tests reviewed if applicable and OV notes from prior OV's with me, as well as other specialists she/he has seen since seeing me last, were all reviewed and used in my medical decision making process today.   - Additionally, discussion had with patient regarding txmnt plan, and their biases/concerns about that plan were used in my medical decision making today.   - The patient agreed with the plan and demonstrated an understanding of the instructions.   No barriers to understanding were identified.   - Red flag symptoms and signs discussed in detail.  Patient expressed understanding regarding what to do in case of emergency\ urgent symptoms.  The patient was advised to call back or seek an in-person evaluation if the symptoms worsen or if the condition fails to improve as anticipated.   Return for 6-8wks- repeat CMP;  f/up EVERY 3-53mo for multiple concerns.    Orders Placed This Encounter  Procedures  . Comprehensive metabolic panel  . Ambulatory referral to Cardiology    Meds ordered this encounter  Medications  . Cholecalciferol (VITAMIN D3) 125 MCG (5000 UT) TABS    Sig: 5,000 IU OTC vitamin D3 daily.    Dispense:  90 tablet    Refill:  3  .  levocetirizine (XYZAL) 5 MG tablet    Sig: Take 1 tablet (5 mg total) by mouth every evening. For seasonal allergies    Dispense:  90 tablet    Refill:  1    Medications Discontinued During This Encounter  Medication Reason  . CINNAMON PO Completed Course  . ibuprofen (ADVIL,MOTRIN) 200 MG tablet       I provided 32.5 minutes of non face-to-face time during this encounter.  Additional time was spent with charting and coordination of care after the actual visit commenced.   Note:  This note was prepared with  assistance of Systems analyst. Occasional wrong-word or sound-a-like substitutions may have occurred due to the inherent limitations of voice recognition software.  Ronald Dance, DO  This document serves as a record of services personally performed by Ronald Dance, DO. It was created on her behalf by Toni Amend, a trained medical scribe. The creation of this record is based on the scribe's personal observations and the provider's statements to them.   I have reviewed the above medical documentation for accuracy and completeness and I concur.  Ronald Dance, DO 09/04/2019 11:07 AM        Patient Care Team    Relationship Specialty Notifications Start End  Ronald Dance, DO PCP - General Family Medicine  08/28/16   Clance, Armando Reichert, MD Referring Physician Pulmonary Disease  09/24/16    Comment: Sleep medicine-  sees patient for his OSA  Kristeen Miss, MD Consulting Physician Neurosurgery  09/27/16   Milus Banister, MD Attending Physician Gastroenterology  09/27/16   Festus Aloe, MD Consulting Physician Urology  09/27/16   Delrae Rend, MD Consulting Physician Endocrinology  11/05/17    Comment: for thyroid  Gaynelle Arabian, MD Consulting Physician Orthopedic Surgery  03/23/19      -Vitals obtained; medications/ allergies reconciled;  personal medical, social, Sx etc.histories were updated by CMA, reviewed by me and are reflected  in chart   Patient Active Problem List   Diagnosis Date Noted  . Impaired fasting glucose 09/27/2016    Priority: High  . History of vitamin D deficiency 09/27/2016    Priority: High  . Diet-controlled diabetes mellitus ([place]) 09/24/2016    Priority: High  . H/O ATRIAL FIBRILLATION, PAROXYSMAL 09/28/2008    Priority: High  . h/o Hyperthyroidism 06/23/2008    Priority: High  . h/o Leukopenia 11/05/2016    Priority: Medium  . GERD (gastroesophageal reflux disease) 04/28/2013    Priority: Medium  . OSA on CPAP 05/13/2010    Priority: Medium  . h/o Osteopenia 07/30/2014    Priority: Low  . Dizziness on standing 08/31/2019  . Closed nondisplaced fracture of pelvis with routine healing 08/31/2019  . Weakness generalized 08/31/2019  . Elevated serum creatinine 08/31/2019  . Serum calcium elevated 08/31/2019  . Constipation 08/31/2019  . 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  . Environmental and seasonal allergies 08/31/2019  . Ibuprofen adverse reaction 08/31/2019  . Vitamin D deficiency 03/23/2019  . History of inguinal hernia repair, bilateral 03/23/2019  . Tear of medial meniscus of knee 08/10/2018  . Acute knee pain 10/12/2017  . Hypomagnesemia 08/06/2017  . Dehydration, mild 08/06/2017  . Elevated serum free T4 level 08/01/2017  . Low TSH level 08/01/2017  . Atrial fibrillation with RVR ([place]) 07/31/2017  . HLD 11/25/2016  . Sinusitis 11/05/2016  . Drug-induced low platelet count 11/05/2016  . Degenerative disk disease 09/27/2016  . Presbycusis 09/27/2016  . BPH associated with nocturia 09/24/2016  . Annual physical exam 04/27/2013  . Erectile dysfunction 10/08/2011  . Elevated PSA/ nocturia sx 09/15/2011  . INGUINAL HERNIA, LEFT 06/23/2008     Current Meds  Medication Sig  . levothyroxine (SYNTHROID) 50 MCG tablet Take 1 tablet (50 mcg total) by mouth daily before breakfast. ABSOLUTELY NO FURTHER REFILLS UNTIL PATIENT HAS A FOLLOW UP WITH PRESCRIBER! (Patient  taking differently: Take 50 mcg by mouth daily before breakfast. )  . TURMERIC CURCUMIN PO Take 1 tablet by mouth daily.  . [DISCONTINUED] ibuprofen (ADVIL,MOTRIN) 200 MG tablet Take 200 mg by mouth every 6 (six) hours  as needed.     Allergies:  Allergies  Allergen Reactions  . Hydrocodone-Acetaminophen Other (See Comments)    REACTION: atrial fibrilation  . Oxycodone Other (See Comments)    Atrial fibrilation  . Oxycodone-Acetaminophen Other (See Comments)    REACTION: atrial fibrilation   ok Darvocet     ROS:  See above HPI for pertinent positives and negatives   Objective:   Height 5\' 5"  (1.651 m), weight 158 lb (71.7 kg).  (if some vitals are omitted, this means that patient was UNABLE to obtain them even though they were asked to get them prior to OV today.  They were asked to call us at their earliest convenience with these once obtained. )  General: A & O * 3; sounds in no acute distress; in usual state of health.  Skin: Pt confirms warm and dry extremities and pink fingertips HEENT: Pt confirms lips non-cyanotic Chest: Patient confirms normal chest excursion and movement Respiratory: speaking in full sentences, no conversational dyspnea; patient confirms no use of accessory muscles Psych: insight appears good, mood- appears full

## 2019-09-14 DIAGNOSIS — H40013 Open angle with borderline findings, low risk, bilateral: Secondary | ICD-10-CM | POA: Diagnosis not present

## 2019-09-14 DIAGNOSIS — H40053 Ocular hypertension, bilateral: Secondary | ICD-10-CM | POA: Diagnosis not present

## 2019-09-21 NOTE — Progress Notes (Signed)
Cardiology Office Note   Date:  09/22/2019   ID:  Ronald Kelley, DOB January 26, 1941, MRN [id number]  PCP:  [name], DO  Cardiologist:   Minus Breeding, MD Referring:  [name], DO  Chief Complaint  Patient presents with   Palpitations      History of Present Illness: Ronald Kelley is a 78 y.o. male who presents for follow-up of palpitations.  He was noted to have atrial fibrillation in 2010 but at that time he did not want to take any anticoagulation.  Is not documented again since then.  He thinks he is probably been in and out of fibrillation a few times maybe the last time a couple weeks ago.  He just feels weakness in his heart might go fast in the 150s for few minutes.  He does not describe irregular beats.  He does not have presyncope or syncope.  He denies any chest pressure, neck or arm discomfort.  He has had no weight gain or edema.  He is physically active.  He thinks he has triggers like pain medicines or coughing in the past.   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    ROTATOR CUFF REPAIR       Current Outpatient Medications  Medication Sig Dispense Refill   Cholecalciferol (VITAMIN D3) 125 MCG (5000 UT) TABS 5,000 IU OTC vitamin D3 daily. 90 tablet 3   levocetirizine (XYZAL) 5 MG tablet Take 1 tablet (5 mg total) by mouth every evening. For seasonal allergies 90 tablet 1   levothyroxine (SYNTHROID) 50 MCG tablet Take 1 tablet (50 mcg total) by mouth daily before breakfast. ABSOLUTELY NO FURTHER REFILLS UNTIL PATIENT HAS A FOLLOW UP WITH PRESCRIBER! (Patient taking differently: Take 50 mcg by mouth daily  before breakfast. ) 30 tablet 0   No current facility-administered medications for this visit.     Allergies:   Hydrocodone-acetaminophen, Oxycodone, and Oxycodone-acetaminophen    Social History:  The patient  reports that he has never smoked. He has never used smokeless tobacco. He reports that he does not drink alcohol or use drugs.   Family History:  The patient's family history includes Alzheimer's disease (age of onset: 62) in his father; Diabetes in his brother; Hypertension in his mother.    ROS:  Please see the history of present illness.   Otherwise, review of systems are positive for none.   All other systems are reviewed and negative.    PHYSICAL EXAM: VS:  BP 108/60    Pulse 68    Temp 97.9 F (36.6 C)    Ht 5\' 5"  (1.651 m)    Wt 159 lb 6.4 oz (72.3 kg)    SpO2 97%    BMI 26.53 kg/m  , BMI Body mass index is 26.53 kg/m. GENERAL:  Well appearing HEENT:  Pupils equal round and reactive, fundi not visualized, oral mucosa unremarkable NECK:  No jugular venous distention, waveform within normal limits, carotid upstroke brisk and symmetric, no bruits, no thyromegaly LYMPHATICS:  No cervical, inguinal adenopathy LUNGS:  Clear to auscultation bilaterally BACK:  No CVA tenderness CHEST:  Unremarkable HEART:  PMI not displaced or sustained,S1 and S2 within normal limits, no S3, no S4, no clicks, no rubs, no murmurs ABD:  Flat, positive bowel sounds normal in frequency in pitch, no bruits, no rebound, no guarding, no midline pulsatile mass, no hepatomegaly, no splenomegaly EXT:  2 plus pulses throughout, no edema, no cyanosis no clubbing SKIN:  No rashes no nodules NEURO:  Cranial nerves II through XII grossly intact, motor grossly intact throughout PSYCH:  Cognitively intact, oriented to person place and time    EKG:  EKG is ordered today. The ekg ordered today demonstrates sinus rhythm, rate 64, axis within normal limits, intervals within normal limits, no acute ST-T wave  changes.   Recent Labs: 08/18/2019: ALT 20; BUN 21; Creatinine, Ser 1.24; Hemoglobin 14.6; Platelets 144; Potassium 4.2; Sodium 142; TSH 1.390    Lipid Panel    Component Value Date/Time   CHOL 146 08/18/2019 0945   TRIG 148 08/18/2019 0945   HDL 38 (L) 08/18/2019 0945   CHOLHDL 3.8 08/18/2019 0945   CHOLHDL 3.6 10/29/2016 0750   VLDL 22 10/29/2016 0750   LDLCALC 82 08/18/2019 0945      Wt Readings from Last 3 Encounters:  09/22/19 159 lb 6.4 oz (72.3 kg)  08/31/19 158 lb (71.7 kg)  03/23/19 157 lb (71.2 kg)      Other studies Reviewed: Additional studies/ records that were reviewed today include: None. Review of the above records demonstrates:  Please see elsewhere in the note.     ASSESSMENT AND PLAN:  PALPITATIONS: I suspect he is having atrial fibrillation talked about this.  He would not take anticoagulation unless it is proven.  His symptoms are infrequent enough that I do not think a monitor 2 to 4 weeks would help at this point.  He is getting get the Alive [facility] and would potentially agree if we document fibrillation on this.  If he has trouble with his and has more frequent symptoms he would come back and allow me to apply an event monitor.  I do note that he has had normal thyroid this year.  Otherwise labs are unremarkable.  ELEVATED AIC: This was 6.6.  He wants to avoid medicines.  I will defer to [name], DO   Current medicines are reviewed at length with the patient today.  The patient does not have concerns regarding medicines.  The following changes have been made:  no change  Labs/ tests ordered today include:   Orders Placed This Encounter  Procedures   EKG 12-Lead     Disposition:   FU with with me in six months.     Signed, Minus Breeding, MD  09/22/2019 11:17 AM    [place]

## 2019-09-22 ENCOUNTER — Encounter: Payer: Self-pay | Admitting: Cardiology

## 2019-09-22 ENCOUNTER — Ambulatory Visit: Payer: Medicare HMO | Admitting: Cardiology

## 2019-09-22 ENCOUNTER — Other Ambulatory Visit: Payer: Self-pay

## 2019-09-22 VITALS — BP 108/60 | HR 68 | Temp 97.9°F | Ht 65.0 in | Wt 159.4 lb

## 2019-09-22 DIAGNOSIS — R42 Dizziness and giddiness: Secondary | ICD-10-CM | POA: Diagnosis not present

## 2019-09-22 DIAGNOSIS — I480 Paroxysmal atrial fibrillation: Secondary | ICD-10-CM

## 2019-09-22 NOTE — Patient Instructions (Signed)
Medication Instructions:  Your physician recommends that you continue on your current medications as directed. Please refer to the Current Medication list given to you today.  If you need a refill on your cardiac medications before your next appointment, please call your pharmacy.   Lab work: NONE  Testing/Procedures: NONE  Follow-Up: At [facility], you and your health needs are our priority.  As part of our continuing mission to provide you with exceptional heart care, we have created designated Provider Care Teams.  These Care Teams include your primary Cardiologist (physician) and Advanced Practice Providers (APPs -  Physician Assistants and Nurse Practitioners) who all work together to provide you with the care you need, when you need it. You may see Minus Breeding, MD or one of the following Advanced Practice Providers on your designated Care Team:    Rosaria Ferries, PA-C  Jory Sims, DNP, ANP  Cadence Kathlen Mody, NP  Your physician wants you to follow-up in: 6 months. You will receive a reminder letter in the mail two months in advance. If you don't receive a letter, please call our office to schedule the follow-up appointment.  Any Other Special Instructions Will Be Listed Below (If Applicable).  [facility]

## 2019-11-23 DIAGNOSIS — N401 Enlarged prostate with lower urinary tract symptoms: Secondary | ICD-10-CM | POA: Diagnosis not present

## 2019-11-23 DIAGNOSIS — R350 Frequency of micturition: Secondary | ICD-10-CM | POA: Diagnosis not present

## 2019-11-23 DIAGNOSIS — N5201 Erectile dysfunction due to arterial insufficiency: Secondary | ICD-10-CM | POA: Diagnosis not present

## 2019-12-12 ENCOUNTER — Telehealth: Payer: Self-pay | Admitting: Family Medicine

## 2019-12-12 NOTE — Telephone Encounter (Signed)
Please review the below. AS, CMA

## 2019-12-12 NOTE — Telephone Encounter (Signed)
Patient called states he was prescribed Tadalafil 20MG  tabs (take half tab daily).  ---Pt states since Dr. Raliegh Scarlet knows his Health Hx he wants her to give him her Opinion on whether this Rx (with all its side-effects) is worth him taking.   --Per pt he has NO Cardiologist & had only experienced Atrial Fib 2x's due to meds status post surgery & once when he drank too much Caffeine coffee.  --Forwarding message to med asst for review w/ provider for recommendations & to contact patient w/advice @ [phone]  --glh

## 2019-12-12 NOTE — Telephone Encounter (Signed)
When I last saw patient at the end of September, patient had acute episodes of dizziness and possible atrial fibrillation.    Due to his acute ongoing vertigo symptoms etc, I do not recommend Cialis at this time as it can certainly cause those symptoms and even exacerbate his current symptoms to quite unpleasant levels.  Thus, I recommend patient discuss this with his cardiologist and get this medicine from them if they approve of it.     I rec pt f/up as instructed- told to f/up in 3-56mo, thus he is due for a f/up ov!    Please remind pt to check his Bp's and P as well as BS's daily until seen in f/up with me- via tele or doxy.

## 2019-12-13 NOTE — Telephone Encounter (Signed)
Spoke with patient and advised him of the below. Patient states he would follow up with Cardiologist for future refills of Cialis. AS, CMA

## 2019-12-22 DIAGNOSIS — Z7189 Other specified counseling: Secondary | ICD-10-CM | POA: Insufficient documentation

## 2019-12-22 DIAGNOSIS — R002 Palpitations: Secondary | ICD-10-CM | POA: Insufficient documentation

## 2019-12-22 NOTE — Progress Notes (Signed)
Cardiology Office Note   Date:  12/23/2019   ID:  Ronald Kelley, DOB 1941/07/24, MRN [id number]  PCP:  [name], DO  Cardiologist:   Minus Breeding, MD Referring:  [name], DO  Chief Complaint  Patient presents with  . Palpitations      History of Present Illness: Ronald Kelley is a 79 y.o. male who presents for follow-up of palpitations.  He was noted to have atrial fibrillation in 2010 but at that time he did not want to take any anticoagulation.  I has not documented again since then.    He was recently given a prescription for Cialis but was worried that it would cause atrial fibrillation.  He has not had any tachypalpitations.  His events occurred after drinking caffeine and 3 surgeries in the past.  He does not recall the exact time of the last one but it was several years ago.  Since then he has been active. The patient denies any new symptoms such as chest discomfort, neck or arm discomfort. There has been no new shortness of breath, PND or orthopnea. There have been no reported palpitations, presyncope or syncope    Past Medical History:  Diagnosis Date  . Atrial fib/flutter, transient   . Degenerative disk disease   . Diabetes mellitus    a1c 6.5, 07/2008  . Hyperthyroidism    remote h/o , no ablation  . OSA on CPAP     Past Surgical History:  Procedure Laterality Date  . HERNIA REPAIR  2010  . 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 ROTATOR CUFF REPAIR       Current Outpatient Medications  Medication Sig Dispense Refill  . Cholecalciferol (VITAMIN D3) 125 MCG (5000 UT) TABS 5,000 IU OTC vitamin D3 daily. 90 tablet 3  . levocetirizine (XYZAL) 5 MG tablet Take 1 tablet (5 mg total) by mouth every evening. For seasonal allergies 90 tablet 1  . levothyroxine (SYNTHROID) 50 MCG tablet Take 1 tablet (50 mcg total) by mouth daily before breakfast. ABSOLUTELY NO FURTHER REFILLS UNTIL PATIENT  HAS A FOLLOW UP WITH PRESCRIBER! (Patient taking differently: Take 50 mcg by mouth daily before breakfast. ) 30 tablet 0   No current facility-administered medications for this visit.    Allergies:   Hydrocodone-acetaminophen, Oxycodone, and Oxycodone-acetaminophen    ROS:  Please see the history of present illness.   Otherwise, review of systems are positive for none.   All other systems are reviewed and negative.    PHYSICAL EXAM: VS:  BP 120/61   Pulse 62   Temp (!) 97.3 F (36.3 C)   Ht 5' 5.5" (1.664 m)   Wt 160 lb (72.6 kg)   BMI 26.22 kg/m  , BMI Body mass index is 26.22 kg/m. GENERAL:  Well appearing NECK:  No jugular venous distention, waveform within normal limits, carotid upstroke brisk and symmetric, no bruits, no thyromegaly LUNGS:  Clear to auscultation bilaterally CHEST:  Unremarkable HEART:  PMI not displaced or sustained,S1 and S2 within normal limits, no S3, no S4, no clicks, no rubs, no murmurs ABD:  Flat, positive bowel sounds normal in frequency in pitch, no bruits, no rebound, no guarding, no midline pulsatile mass, no hepatomegaly, no splenomegaly EXT:  2 plus pulses throughout, no edema, no cyanosis no clubbing     EKG:  EKG is  ordered today. The ekg ordered today  demonstrates sinus rhythm, rate 62, axis within normal limits, intervals within normal limits, no acute ST-T wave changes.   Recent Labs: 08/18/2019: ALT 20; BUN 21; Creatinine, Ser 1.24; Hemoglobin 14.6; Platelets 144; Potassium 4.2; Sodium 142; TSH 1.390    Lipid Panel    Component Value Date/Time   CHOL 146 08/18/2019 0945   TRIG 148 08/18/2019 0945   HDL 38 (L) 08/18/2019 0945   CHOLHDL 3.8 08/18/2019 0945   CHOLHDL 3.6 10/29/2016 0750   VLDL 22 10/29/2016 0750   LDLCALC 82 08/18/2019 0945      Wt Readings from Last 3 Encounters:  12/23/19 160 lb (72.6 kg)  09/22/19 159 lb 6.4 oz (72.3 kg)  08/31/19 158 lb (71.7 kg)      Other studies Reviewed: Additional studies/  records that were reviewed today include: None. Review of the above records demonstrates:  NA   ASSESSMENT AND PLAN:  PALPITATIONS:   He has not had any symptomatic recurrence of this.  At this point I had a long discussion with him and said that I would not see a contraindication to trying the Cialis and if he had any issues such as fibrillation then we would know to avoid it going forward.  He is also going to look into getting a Chad.  Otherwise at this point no change in therapy.   COVID EDUCATION:   We talked about the vaccine and he will think about getting it.   Current medicines are reviewed at length with the patient today.  The patient does not have concerns regarding medicines.  The following changes have been made:  no change  Labs/ tests ordered today include:   Orders Placed This Encounter  Procedures  . EKG 12-Lead     Disposition:   FU with with me in six months.     Signed, Minus Breeding, MD  12/23/2019 2:47 PM    [name]

## 2019-12-23 ENCOUNTER — Other Ambulatory Visit: Payer: Self-pay

## 2019-12-23 ENCOUNTER — Ambulatory Visit: Payer: Medicare HMO | Admitting: Cardiology

## 2019-12-23 ENCOUNTER — Encounter: Payer: Self-pay | Admitting: Cardiology

## 2019-12-23 VITALS — BP 120/61 | HR 62 | Temp 97.3°F | Ht 65.5 in | Wt 160.0 lb

## 2019-12-23 DIAGNOSIS — R002 Palpitations: Secondary | ICD-10-CM

## 2019-12-23 DIAGNOSIS — Z7189 Other specified counseling: Secondary | ICD-10-CM

## 2019-12-23 NOTE — Patient Instructions (Signed)
Medication Instructions:  No Changes *If you need a refill on your cardiac medications before your next appointment, please call your pharmacy*  Lab Work: None.  Testing/Procedures: None  Follow-Up: At [facility], you and your health needs are our priority.  As part of our continuing mission to provide you with exceptional heart care, we have created designated Provider Care Teams.  These Care Teams include your primary Cardiologist (physician) and Advanced Practice Providers (APPs -  Physician Assistants and Nurse Practitioners) who all work together to provide you with the care you need, when you need it.  FOLLOW UP AS NEEDED  Other Instructions [facility] by Jodelle Red

## 2020-02-20 DIAGNOSIS — N401 Enlarged prostate with lower urinary tract symptoms: Secondary | ICD-10-CM | POA: Diagnosis not present

## 2020-02-20 DIAGNOSIS — R350 Frequency of micturition: Secondary | ICD-10-CM | POA: Diagnosis not present

## 2020-02-20 DIAGNOSIS — N5201 Erectile dysfunction due to arterial insufficiency: Secondary | ICD-10-CM | POA: Diagnosis not present

## 2020-02-28 ENCOUNTER — Other Ambulatory Visit: Payer: Self-pay | Admitting: Family Medicine

## 2020-02-29 ENCOUNTER — Telehealth: Payer: Self-pay | Admitting: Family Medicine

## 2020-02-29 DIAGNOSIS — R69 Illness, unspecified: Secondary | ICD-10-CM | POA: Diagnosis not present

## 2020-02-29 MED ORDER — BLOOD GLUCOSE MONITOR KIT: PACK | 0 refills | Status: DC

## 2020-02-29 NOTE — Telephone Encounter (Signed)
Patient called request a new glucose meter-Per his Ins Co/[employer] they will only pay for Lifescan One Touch .  Pt request Rx for this meter be sent to :   [id number] [address], [place]. [phone] (Phone) [phone] (Fax)   --Dion Body

## 2020-03-12 ENCOUNTER — Telehealth: Payer: Self-pay | Admitting: Family Medicine

## 2020-03-12 DIAGNOSIS — R7989 Other specified abnormal findings of blood chemistry: Secondary | ICD-10-CM

## 2020-03-12 NOTE — Telephone Encounter (Signed)
Patient is requesting a call back from clinic staff to discuss ongoing meds that he takes continually. He seemed slightly confused on what he is actually on and wants to double check what his med regiment should be. Please contact patient at home number when available.

## 2020-03-12 NOTE — Telephone Encounter (Signed)
Patient states he has not been taking levothyroxine since 08/2019. He got the levocertrizine and levothyroxzine confused and thought he was taking his thyroid med but instead was taking allergy medication.   Patient was last seen 08/31/19 and advised to follow up in 3-4 months Last TSH and T4Free drawn 08/18/2019  Last refill of levothyroxine given 08/01/19 #30 with no refills.   Patient request med to be sent to pharmacy. Advised patient he needs a visit but I would discuss action plan with provider and call him back to schedule. AS, CMA

## 2020-03-12 NOTE — Telephone Encounter (Signed)
Certainly he should have follow-up as discussed last office visit and, if he had, we would have been able to rectify this sooner for him.  Glad he figured it out though now.    Since he has been out of them and not taking them for so long, at this time, I recommend that he be given 60 tablets of the levothyroxine ( no RF) and then have a follow-up lab only visit visit for TSH and free T4 in 6 weeks, with an office visit 3 days later to review those results and his other medical conditions!   We need to monitor his thyroid levels on his treatment plan, not without it.

## 2020-03-13 MED ORDER — LEVOTHYROXINE SODIUM 50 MCG PO TABS: 50.0000 ug | ORAL_TABLET | Freq: Every day | ORAL | 0 refills | Status: DC

## 2020-03-13 NOTE — Telephone Encounter (Signed)
Patient is aware of the below and verbalized understanding. [facility] [address] per patient request. Future labs ordered and patient call transferred to front desk to schedule labs and OV apt. AS, CMA

## 2020-03-13 NOTE — Addendum Note (Signed)
Addended by: Mickel Crow on: 03/13/2020 09:34 AM   Modules accepted: Orders

## 2020-03-21 DIAGNOSIS — Z961 Presence of intraocular lens: Secondary | ICD-10-CM | POA: Diagnosis not present

## 2020-03-21 DIAGNOSIS — H401131 Primary open-angle glaucoma, bilateral, mild stage: Secondary | ICD-10-CM | POA: Diagnosis not present

## 2020-03-23 ENCOUNTER — Other Ambulatory Visit: Payer: Self-pay | Admitting: Family Medicine

## 2020-03-26 ENCOUNTER — Other Ambulatory Visit: Payer: Self-pay | Admitting: Family Medicine

## 2020-04-14 ENCOUNTER — Other Ambulatory Visit: Payer: Self-pay | Admitting: Family Medicine

## 2020-04-24 ENCOUNTER — Other Ambulatory Visit: Payer: Medicare HMO

## 2020-04-25 ENCOUNTER — Other Ambulatory Visit: Payer: Self-pay

## 2020-04-25 ENCOUNTER — Other Ambulatory Visit: Payer: Medicare HMO

## 2020-04-25 DIAGNOSIS — E0590 Thyrotoxicosis, unspecified without thyrotoxic crisis or storm: Secondary | ICD-10-CM

## 2020-04-25 DIAGNOSIS — R7989 Other specified abnormal findings of blood chemistry: Secondary | ICD-10-CM | POA: Diagnosis not present

## 2020-04-25 DIAGNOSIS — H40023 Open angle with borderline findings, high risk, bilateral: Secondary | ICD-10-CM | POA: Diagnosis not present

## 2020-04-25 DIAGNOSIS — H40053 Ocular hypertension, bilateral: Secondary | ICD-10-CM | POA: Diagnosis not present

## 2020-04-26 LAB — TSH: TSH: 1.62 u[IU]/mL (ref 0.450–4.500)

## 2020-04-26 LAB — T4, FREE: Free T4: 1.32 ng/dL (ref 0.82–1.77)

## 2020-04-27 ENCOUNTER — Ambulatory Visit (INDEPENDENT_AMBULATORY_CARE_PROVIDER_SITE_OTHER): Payer: Medicare HMO | Admitting: Physician Assistant

## 2020-04-27 ENCOUNTER — Encounter: Payer: Self-pay | Admitting: Physician Assistant

## 2020-04-27 ENCOUNTER — Other Ambulatory Visit: Payer: Self-pay

## 2020-04-27 VITALS — BP 120/66 | HR 75 | Temp 98.1°F | Ht 65.5 in | Wt 159.9 lb

## 2020-04-27 DIAGNOSIS — E785 Hyperlipidemia, unspecified: Secondary | ICD-10-CM | POA: Diagnosis not present

## 2020-04-27 DIAGNOSIS — R7989 Other specified abnormal findings of blood chemistry: Secondary | ICD-10-CM | POA: Diagnosis not present

## 2020-04-27 DIAGNOSIS — J3089 Other allergic rhinitis: Secondary | ICD-10-CM | POA: Diagnosis not present

## 2020-04-27 DIAGNOSIS — E119 Type 2 diabetes mellitus without complications: Secondary | ICD-10-CM

## 2020-04-27 LAB — POCT GLYCOSYLATED HEMOGLOBIN (HGB A1C): Hemoglobin A1C: 7 % — AB (ref 4.0–5.6)

## 2020-04-27 NOTE — Progress Notes (Signed)
Established Patient Office Visit  Subjective:  Patient ID: Ronald Kelley, male    DOB: October 06, 1941  Age: 79 y.o. MRN: [id number]  CC:  Chief Complaint  Patient presents with  . Hyperlipidemia  . Diabetes    HPI Murad Staples Balsam presents for 6 week follow-up on hyperlipidemia, diabetes mellitus, and low TSH.   HLD: Pt trying to control with diet and exercise. States needs to be more active and plans to walk more.   Diabetes: Pt denies increased urination or thirst. Pt reports he hasn't been as diligent with diet. He has been eating more sweets and eating out. No hypoglycemic symptoms. Not checking glucose at home.   Low TSH level: Asymptomatic. Reports medication compliance.   Past Medical History:  Diagnosis Date  . Atrial fib/flutter, transient   . Degenerative disk disease   . Diabetes mellitus    a1c 6.5, 07/2008  . Hyperthyroidism    remote h/o , no ablation  . OSA on CPAP     Past Surgical History:  Procedure Laterality Date  . HERNIA REPAIR  2010  . 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 ROTATOR CUFF REPAIR      Family History  Problem Relation Age of Onset  . Hypertension Mother   . Diabetes Brother   . Alzheimer's disease Father 72  . Coronary artery disease Neg Hx   . Stroke Neg Hx   . Colon cancer Neg Hx   . Prostate cancer Neg Hx     Social History   Socioeconomic History  . Marital status: Married    Spouse name: Not on file  . Number of children: 3  . Years of education: Not on file  . Highest education level: Not on file  Occupational History  . Occupation: Technical sales engineer-- semi-retirement  Tobacco Use  . Smoking status: Never Smoker  . Smokeless tobacco: Never Used  Substance and Sexual Activity  . Alcohol use: No  . Drug use: No  . Sexual activity: Not on file  Other Topics Concern  . Not on file  Social History Narrative   Married to [employer]   Financial Resource Strain:   . Difficulty of Paying Living Expenses:   Food Insecurity:   . Worried About Charity fundraiser in the Last Year:   . Arboriculturist in the Last Year:   Transportation Needs:   . Film/video editor (Medical):   Marland Kitchen Lack of Transportation (Non-Medical):   Physical Activity:   . Days of Exercise per Week:   . Minutes of Exercise per Session:   Stress:   . Feeling of Stress :   Social Connections:   . Frequency of Communication with Friends and Family:   . Frequency of Social Gatherings with Friends and Family:   . Attends Religious Services:   . Active Member of Clubs or Organizations:   . Attends Archivist Meetings:   Marland Kitchen Marital Status:   Intimate Partner Violence:   . Fear of Current or Ex-Partner:   . Emotionally Abused:   Marland Kitchen Physically Abused:   . Sexually Abused:     Outpatient Medications Prior to Visit  Medication Sig Dispense Refill  . blood glucose meter kit and supplies KIT Dispense based on patient and insurance preference. Use up to four times daily as directed. (FOR ICD-9  250.00, 250.01). 1 each 0  . Cholecalciferol (VITAMIN D3) 125 MCG (5000 UT) TABS 5,000 IU OTC vitamin D3 daily. 90 tablet 3  . levocetirizine (XYZAL) 5 MG tablet TAKE 1 TABLET (5 MG TOTAL) BY MOUTH EVERY EVENING. FOR SEASONAL ALLERGIES 15 tablet 0  . levothyroxine (SYNTHROID) 50 MCG tablet Take 1 tablet (50 mcg total) by mouth daily before breakfast. 60 tablet 0  . ONETOUCH ULTRA test strip TEST UP TO 4 TIMES A DAY 100 strip 11   No facility-administered medications prior to visit.    Allergies  Allergen Reactions  . Hydrocodone-Acetaminophen Other (See Comments)    REACTION: atrial fibrilation  . Oxycodone Other (See Comments)    Atrial fibrilation  . Oxycodone-Acetaminophen Other (See Comments)    REACTION: atrial fibrilation   ok Darvocet    ROS Review of Systems Review of Systems: General:  Denies fever, chills, unexplained  weight loss.  Optho/Auditory:  Denies visual changes, blurred vision/LOV Respiratory:  Denies SOB, DOE more than baseline levels.  Cardiovascular:  Denies chest pain, palpitations, new onset peripheral edema  Gastrointestinal:  Denies nausea, vomiting, diarrhea.  Genitourinary: Denies dysuria, freq/ urgency, flank pain  Endocrine:  Denies hot or cold intolerance, polyuria, polydipsia. Musculoskeletal:  Denies unexplained myalgias, joint swelling, unexplained arthralgias, gait problems.  Skin: Denies rash, suspicious lesions Neurological:  Denies dizziness, unexplained weakness, numbness  Psychiatric/Behavioral: Denies mood changes, suicidal or homicidal ideations, hallucinations    Objective:    Physical Exam General:  Well Developed, well nourished, appropriate for stated age.  Neuro:  Alert and oriented,  extra-ocular muscles intact  HEENT:  Normocephalic, atraumatic, neck supple, no carotid bruits appreciated  Skin:  no gross rash, warm, pink. Cardiac:  RRR, S1 S2, no murmur Respiratory:  ECTA B/L and A/P, Not using accessory muscles, speaking in full sentences- unlabored. Vascular:  Ext warm, no cyanosis apprec. Psych:  No HI/SI, judgement and insight good, Euthymic mood. Full Affect.   BP 120/66   Pulse 75   Temp 98.1 F (36.7 C) (Oral)   Ht 5' 5.5" (1.664 m)   Wt 159 lb 14.4 oz (72.5 kg)   SpO2 97% Comment: on RA  BMI 26.20 kg/m  Wt Readings from Last 3 Encounters:  04/27/20 159 lb 14.4 oz (72.5 kg)  12/23/19 160 lb (72.6 kg)  09/22/19 159 lb 6.4 oz (72.3 kg)     Health Maintenance Due  Topic Date Due  . COVID-19 Vaccine (1) Never done  . PNA vac Low Risk Adult (2 of 2 - PCV13) 06/23/2009  . TETANUS/TDAP  06/23/2018    There are no preventive care reminders to display for this patient.  Lab Results  Component Value Date   TSH 1.620 04/25/2020   Lab Results  Component Value Date   WBC 8.5 08/18/2019   HGB 14.6 08/18/2019   HCT 43.7 08/18/2019   MCV 91  08/18/2019   PLT 144 (L) 08/18/2019   Lab Results  Component Value Date   NA 142 08/18/2019   K 4.2 08/18/2019   CO2 23 08/18/2019   GLUCOSE 163 (H) 08/18/2019   BUN 21 08/18/2019   CREATININE 1.24 08/18/2019   BILITOT 1.0 08/18/2019   ALKPHOS 103 08/18/2019   AST 15 08/18/2019   ALT 20 08/18/2019   PROT 6.6 08/18/2019   ALBUMIN 4.2 08/18/2019   CALCIUM 11.8 (H) 08/18/2019   ANIONGAP 6 08/01/2017   GFR 95.86 07/31/2015   Lab Results  Component Value Date   CHOL 146 08/18/2019  Lab Results  Component Value Date   HDL 38 (L) 08/18/2019   Lab Results  Component Value Date   LDLCALC 82 08/18/2019   Lab Results  Component Value Date   TRIG 148 08/18/2019   Lab Results  Component Value Date   CHOLHDL 3.8 08/18/2019   Lab Results  Component Value Date   HGBA1C 7.0 (A) 04/27/2020      Assessment & Plan:   Problem List Items Addressed This Visit      Endocrine   Diet-controlled diabetes mellitus ([place]) - Primary (Chronic)   Relevant Orders   POCT glycosylated hemoglobin (Hb A1C) (Completed)     Other   HLD   Low TSH level   Environmental and seasonal allergies     Diabetes Mellitus: - A1c today is 7.0, stable. Given patient's age and co-morbidities A1c goal is 7-8. - Discussed with patient importance to improve diet and reduce carbohydrates and sweets to control diabetes and increase physical activity. Pt verbalized understanding and plans to increase walking and reduce glucose.  - Encourage ambulatory glucose monitoring. - Will continue to monitor.   HLD: - Last lipid panel wnl's w/ exception of decreased HDL. - Follow heart healthy diet and stay as active as possible.  - Plan to recheck lipid panel at next OV.  Low TSH level: - Most recent TSH and free T4 wnl's - Continue Levothyroxine 50 mcg  Environmental and seasonal allergies: - stable - continue Xyzal   No orders of the defined types were placed in this encounter.   Follow-up: Return  in about 3 months (around 07/28/2020) for HLD, DM, hypothyroid and FBW.    Lorrene Reid, PA-C

## 2020-04-27 NOTE — Patient Instructions (Signed)

## 2020-05-30 ENCOUNTER — Other Ambulatory Visit: Payer: Self-pay | Admitting: Family Medicine

## 2020-05-30 ENCOUNTER — Telehealth: Payer: Self-pay | Admitting: Physician Assistant

## 2020-05-30 DIAGNOSIS — R7989 Other specified abnormal findings of blood chemistry: Secondary | ICD-10-CM

## 2020-05-30 NOTE — Telephone Encounter (Signed)
Patient called and left VM asking to speak with Dorothea Ogle and to contact at home number. Called x2 and home line rang busy. Will call back later. Sending message to front staff to make aware.

## 2020-05-31 ENCOUNTER — Other Ambulatory Visit: Payer: Self-pay

## 2020-05-31 DIAGNOSIS — R7989 Other specified abnormal findings of blood chemistry: Secondary | ICD-10-CM

## 2020-05-31 MED ORDER — LEVOCETIRIZINE DIHYDROCHLORIDE 5 MG PO TABS: 5.0000 mg | ORAL_TABLET | Freq: Every evening | ORAL | 1 refills | Status: DC

## 2020-05-31 MED ORDER — LEVOTHYROXINE SODIUM 50 MCG PO TABS: 50.0000 ug | ORAL_TABLET | Freq: Every day | ORAL | 1 refills | Status: DC

## 2020-08-20 ENCOUNTER — Encounter: Payer: Self-pay | Admitting: Physician Assistant

## 2020-08-20 ENCOUNTER — Other Ambulatory Visit: Payer: Self-pay

## 2020-08-20 ENCOUNTER — Ambulatory Visit (INDEPENDENT_AMBULATORY_CARE_PROVIDER_SITE_OTHER): Payer: Medicare HMO | Admitting: Physician Assistant

## 2020-08-20 VITALS — BP 136/81 | HR 75 | Ht 65.5 in | Wt 157.8 lb

## 2020-08-20 DIAGNOSIS — E1169 Type 2 diabetes mellitus with other specified complication: Secondary | ICD-10-CM

## 2020-08-20 DIAGNOSIS — R300 Dysuria: Secondary | ICD-10-CM | POA: Diagnosis not present

## 2020-08-20 DIAGNOSIS — K5900 Constipation, unspecified: Secondary | ICD-10-CM | POA: Diagnosis not present

## 2020-08-20 DIAGNOSIS — E119 Type 2 diabetes mellitus without complications: Secondary | ICD-10-CM | POA: Diagnosis not present

## 2020-08-20 DIAGNOSIS — N401 Enlarged prostate with lower urinary tract symptoms: Secondary | ICD-10-CM

## 2020-08-20 DIAGNOSIS — I480 Paroxysmal atrial fibrillation: Secondary | ICD-10-CM

## 2020-08-20 DIAGNOSIS — R351 Nocturia: Secondary | ICD-10-CM | POA: Diagnosis not present

## 2020-08-20 DIAGNOSIS — E785 Hyperlipidemia, unspecified: Secondary | ICD-10-CM | POA: Diagnosis not present

## 2020-08-20 LAB — POCT URINALYSIS DIPSTICK
Bilirubin, UA: NEGATIVE
Glucose, UA: NEGATIVE
Ketones, UA: NEGATIVE
Nitrite, UA: NEGATIVE
Protein, UA: NEGATIVE
Spec Grav, UA: 1.025 (ref 1.010–1.025)
Urobilinogen, UA: 0.2 E.U./dL
pH, UA: 6.5 (ref 5.0–8.0)

## 2020-08-20 LAB — POCT GLYCOSYLATED HEMOGLOBIN (HGB A1C): Hemoglobin A1C: 7.5 % — AB (ref 4.0–5.6)

## 2020-08-20 MED ORDER — NITROFURANTOIN MONOHYD MACRO 100 MG PO CAPS: 100.0000 mg | ORAL_CAPSULE | Freq: Two times a day (BID) | ORAL | 0 refills | Status: DC

## 2020-08-20 NOTE — Patient Instructions (Addendum)
Follow up with Cardiology- Dr. Percival Spanish   Heart-Healthy Eating Plan Heart-healthy meal planning includes:  Eating less unhealthy fats.  Eating more healthy fats.  Making other changes in your diet. Talk with your doctor or a diet specialist (dietitian) to create an eating plan that is right for you. What is my plan? Your doctor may recommend an eating plan that includes:  Total fat: ______% or less of total calories a day.  Saturated fat: ______% or less of total calories a day.  Cholesterol: less than _________mg a day. What are tips for following this plan? Cooking Avoid frying your food. Try to bake, boil, grill, or broil it instead. You can also reduce fat by:  Removing the skin from poultry.  Removing all visible fats from meats.  Steaming vegetables in water or broth. Meal planning   At meals, divide your plate into four equal parts: ? Fill one-half of your plate with vegetables and green salads. ? Fill one-fourth of your plate with whole grains. ? Fill one-fourth of your plate with lean protein foods.  Eat 4-5 servings of vegetables per day. A serving of vegetables is: ? 1 cup of raw or cooked vegetables. ? 2 cups of raw leafy greens.  Eat 4-5 servings of fruit per day. A serving of fruit is: ? 1 medium whole fruit. ?  cup of dried fruit. ?  cup of fresh, frozen, or canned fruit. ?  cup of 100% fruit juice.  Eat more foods that have soluble fiber. These are apples, broccoli, carrots, beans, peas, and barley. Try to get 20-30 g of fiber per day.  Eat 4-5 servings of nuts, legumes, and seeds per week: ? 1 serving of dried beans or legumes equals  cup after being cooked. ? 1 serving of nuts is  cup. ? 1 serving of seeds equals 1 tablespoon. General information  Eat more home-cooked food. Eat less restaurant, buffet, and fast food.  Limit or avoid alcohol.  Limit foods that are high in starch and sugar.  Avoid fried foods.  Lose weight if you are  overweight.  Keep track of how much salt (sodium) you eat. This is important if you have high blood pressure. Ask your doctor to tell you more about this.  Try to add vegetarian meals each week. Fats  Choose healthy fats. These include olive oil and canola oil, flaxseeds, walnuts, almonds, and seeds.  Eat more omega-3 fats. These include salmon, mackerel, sardines, tuna, flaxseed oil, and ground flaxseeds. Try to eat fish at least 2 times each week.  Check food labels. Avoid foods with trans fats or high amounts of saturated fat.  Limit saturated fats. ? These are often found in animal products, such as meats, butter, and cream. ? These are also found in plant foods, such as palm oil, palm kernel oil, and coconut oil.  Avoid foods with partially hydrogenated oils in them. These have trans fats. Examples are stick margarine, some tub margarines, cookies, crackers, and other baked goods. What foods can I eat? Fruits All fresh, canned (in natural juice), or frozen fruits. Vegetables Fresh or frozen vegetables (raw, steamed, roasted, or grilled). Green salads. Grains Most grains. Choose whole wheat and whole grains most of the time. Rice and pasta, including brown rice and pastas made with whole wheat. Meats and other proteins Lean, well-trimmed beef, veal, pork, and lamb. Chicken and [place] without skin. All fish and shellfish. Wild duck, rabbit, pheasant, and venison. Egg whites or low-cholesterol egg substitutes. Dried  beans, peas, lentils, and tofu. Seeds and most nuts. Dairy Low-fat or nonfat cheeses, including ricotta and mozzarella. Skim or 1% milk that is liquid, powdered, or evaporated. Buttermilk that is made with low-fat milk. Nonfat or low-fat yogurt. Fats and oils Non-hydrogenated (trans-free) margarines. Vegetable oils, including soybean, sesame, sunflower, olive, peanut, safflower, corn, canola, and cottonseed. Salad dressings or mayonnaise made with a vegetable  oil. Beverages Mineral water. Coffee and tea. Diet carbonated beverages. Sweets and desserts Sherbet, gelatin, and fruit ice. Small amounts of dark chocolate. Limit all sweets and desserts. Seasonings and condiments All seasonings and condiments. The items listed above may not be a complete list of foods and drinks you can eat. Contact a dietitian for more options. What foods should I avoid? Fruits Canned fruit in heavy syrup. Fruit in cream or butter sauce. Fried fruit. Limit coconut. Vegetables Vegetables cooked in cheese, cream, or butter sauce. Fried vegetables. Grains Breads that are made with saturated or trans fats, oils, or whole milk. Croissants. Sweet rolls. Donuts. High-fat crackers, such as cheese crackers. Meats and other proteins Fatty meats, such as hot dogs, ribs, sausage, bacon, rib-eye roast or steak. High-fat deli meats, such as salami and bologna. Caviar. Domestic duck and goose. Organ meats, such as liver. Dairy Cream, sour cream, cream cheese, and creamed cottage cheese. Whole-milk cheeses. Whole or 2% milk that is liquid, evaporated, or condensed. Whole buttermilk. Cream sauce or high-fat cheese sauce. Yogurt that is made from whole milk. Fats and oils Meat fat, or shortening. Cocoa butter, hydrogenated oils, palm oil, coconut oil, palm kernel oil. Solid fats and shortenings, including bacon fat, salt pork, lard, and butter. Nondairy cream substitutes. Salad dressings with cheese or sour cream. Beverages Regular sodas and juice drinks with added sugar. Sweets and desserts Frosting. Pudding. Cookies. Cakes. Pies. Milk chocolate or white chocolate. Buttered syrups. Full-fat ice cream or ice cream drinks. The items listed above may not be a complete list of foods and drinks to avoid. Contact a dietitian for more information. Summary  Heart-healthy meal planning includes eating less unhealthy fats, eating more healthy fats, and making other changes in your diet.  Eat  a balanced diet. This includes fruits and vegetables, low-fat or nonfat dairy, lean protein, nuts and legumes, whole grains, and heart-healthy oils and fats. This information is not intended to replace advice given to you by your health care provider. Make sure you discuss any questions you have with your health care provider. Document Revised: 01/21/2018 Document Reviewed: 12/25/2017 Elsevier Patient Education  [place].   Diabetes Mellitus and Nutrition, Adult When you have diabetes (diabetes mellitus), it is very important to have healthy eating habits because your blood sugar (glucose) levels are greatly affected by what you eat and drink. Eating healthy foods in the appropriate amounts, at about the same times every day, can help you:  Control your blood glucose.  Lower your risk of heart disease.  Improve your blood pressure.  Reach or maintain a healthy weight. Every person with diabetes is different, and each person has different needs for a meal plan. Your health care provider may recommend that you work with a diet and nutrition specialist (dietitian) to make a meal plan that is best for you. Your meal plan may vary depending on factors such as:  The calories you need.  The medicines you take.  Your weight.  Your blood glucose, blood pressure, and cholesterol levels.  Your activity level.  Other health conditions you have, such as  heart or kidney disease. How do carbohydrates affect me? Carbohydrates, also called carbs, affect your blood glucose level more than any other type of food. Eating carbs naturally raises the amount of glucose in your blood. Carb counting is a method for keeping track of how many carbs you eat. Counting carbs is important to keep your blood glucose at a healthy level, especially if you use insulin or take certain oral diabetes medicines. It is important to know how many carbs you can safely have in each meal. This is different for every  person. Your dietitian can help you calculate how many carbs you should have at each meal and for each snack. Foods that contain carbs include:  Bread, cereal, rice, pasta, and crackers.  Potatoes and corn.  Peas, beans, and lentils.  Milk and yogurt.  Fruit and juice.  Desserts, such as cakes, cookies, ice cream, and candy. How does alcohol affect me? Alcohol can cause a sudden decrease in blood glucose (hypoglycemia), especially if you use insulin or take certain oral diabetes medicines. Hypoglycemia can be a life-threatening condition. Symptoms of hypoglycemia (sleepiness, dizziness, and confusion) are similar to symptoms of having too much alcohol. If your health care provider says that alcohol is safe for you, follow these guidelines:  Limit alcohol intake to no more than 1 drink per day for nonpregnant women and 2 drinks per day for men. One drink equals 12 oz of beer, 5 oz of wine, or 1 oz of hard liquor.  Do not drink on an empty stomach.  Keep yourself hydrated with water, diet soda, or unsweetened iced tea.  Keep in mind that regular soda, juice, and other mixers may contain a lot of sugar and must be counted as carbs. What are tips for following this plan?  Reading food labels  Start by checking the serving size on the "Nutrition Facts" label of packaged foods and drinks. The amount of calories, carbs, fats, and other nutrients listed on the label is based on one serving of the item. Many items contain more than one serving per package.  Check the total grams (g) of carbs in one serving. You can calculate the number of servings of carbs in one serving by dividing the total carbs by 15. For example, if a food has 30 g of total carbs, it would be equal to 2 servings of carbs.  Check the number of grams (g) of saturated and trans fats in one serving. Choose foods that have low or no amount of these fats.  Check the number of milligrams (mg) of salt (sodium) in one serving.  Most people should limit total sodium intake to less than 2,300 mg per day.  Always check the nutrition information of foods labeled as "low-fat" or "nonfat". These foods may be higher in added sugar or refined carbs and should be avoided.  Talk to your dietitian to identify your daily goals for nutrients listed on the label. Shopping  Avoid buying canned, premade, or processed foods. These foods tend to be high in fat, sodium, and added sugar.  Shop around the outside edge of the grocery store. This includes fresh fruits and vegetables, bulk grains, fresh meats, and fresh dairy. Cooking  Use low-heat cooking methods, such as baking, instead of high-heat cooking methods like deep frying.  Cook using healthy oils, such as olive, canola, or sunflower oil.  Avoid cooking with butter, cream, or high-fat meats. Meal planning  Eat meals and snacks regularly, preferably at the same times  every day. Avoid going long periods of time without eating.  Eat foods high in fiber, such as fresh fruits, vegetables, beans, and whole grains. Talk to your dietitian about how many servings of carbs you can eat at each meal.  Eat 4-6 ounces (oz) of lean protein each day, such as lean meat, chicken, fish, eggs, or tofu. One oz of lean protein is equal to: ? 1 oz of meat, chicken, or fish. ? 1 egg. ?  cup of tofu.  Eat some foods each day that contain healthy fats, such as avocado, nuts, seeds, and fish. Lifestyle  Check your blood glucose regularly.  Exercise regularly as told by your health care provider. This may include: ? 150 minutes of moderate-intensity or vigorous-intensity exercise each week. This could be brisk walking, biking, or water aerobics. ? Stretching and doing strength exercises, such as yoga or weightlifting, at least 2 times a week.  Take medicines as told by your health care provider.  Do not use any products that contain nicotine or tobacco, such as cigarettes and e-cigarettes.  If you need help quitting, ask your health care provider.  Work with a Social worker or diabetes educator to identify strategies to manage stress and any emotional and social challenges. Questions to ask a health care provider  Do I need to meet with a diabetes educator?  Do I need to meet with a dietitian?  What number can I call if I have questions?  When are the best times to check my blood glucose? Where to find more information:  American Diabetes Association: diabetes.org  Academy of Nutrition and Dietetics: www.eatright.[employer] of Diabetes and Digestive and Kidney Diseases (NIH): [employer] Summary  A healthy meal plan will help you control your blood glucose and maintain a healthy lifestyle.  Working with a diet and nutrition specialist (dietitian) can help you make a meal plan that is best for you.  Keep in mind that carbohydrates (carbs) and alcohol have immediate effects on your blood glucose levels. It is important to count carbs and to use alcohol carefully. This information is not intended to replace advice given to you by your health care provider. Make sure you discuss any questions you have with your health care provider. Document Revised: 10/30/2017 Document Reviewed: 12/22/2016 Elsevier Patient Education  2020 [employer].

## 2020-08-20 NOTE — Progress Notes (Signed)
Established Patient Office Visit  Subjective:  Patient ID: Ronald Kelley, male    DOB: October 25, 1941  Age: 79 y.o. MRN: [id number]  CC:  Chief Complaint  Patient presents with  . Diabetes  . Hyperlipidemia    HPI Ronald Kelley presents for chronic follow up on diabetes mellitus and hyperlipidemia.  Diabetes: Pt denies increased thirst. Reports increased nocturia and dry mouth. Doesn't check glucose at home. Reports sweets are his weakness.   HLD: States diet consists of fruit and whole grains. He hasn't been as active as he would to.   PAF: Followed by Cardiology. Reports sometimes has an odd sensation which might be palpitations which are brief. States caffeine can be a trigger for his atrial fibrillation.  Nocturia, dysuria: Has complaints today of increased nocturia and has noticed some mild burning with urination, which is occasionally. Denies hematuria.   Constipation: Reports he strains and stool is hard. He doesn't drink enough water.   Past Medical History:  Diagnosis Date  . Atrial fib/flutter, transient   . Degenerative disk disease   . Diabetes mellitus    a1c 6.5, 07/2008  . Hyperthyroidism    remote h/o , no ablation  . OSA on CPAP     Past Surgical History:  Procedure Laterality Date  . HERNIA REPAIR  2010  . 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 ROTATOR CUFF REPAIR      Family History  Problem Relation Age of Onset  . Hypertension Mother   . Diabetes Brother   . Alzheimer's disease Father 67  . Coronary artery disease Neg Hx   . Stroke Neg Hx   . Colon cancer Neg Hx   . Prostate cancer Neg Hx     Social History   Socioeconomic History  . Marital status: Married    Spouse name: Not on file  . Number of children: 3  . Years of education: Not on file  . Highest education level: Not on file  Occupational History  . Occupation: Technical sales engineer-- semi-retirement  Tobacco Use  .  Smoking status: Never Smoker  . Smokeless tobacco: Never Used  Vaping Use  . Vaping Use: Never used  Substance and Sexual Activity  . Alcohol use: No  . Drug use: No  . Sexual activity: Not on file  Other Topics Concern  . Not on file  Social History Narrative   Married to [employer]   Financial Resource Strain:   . Difficulty of Paying Living Expenses: Not on file  Food Insecurity:   . Worried About Charity fundraiser in the Last Year: Not on file  . Ran Out of Food in the Last Year: Not on file  Transportation Needs:   . Lack of Transportation (Medical): Not on file  . Lack of Transportation (Non-Medical): Not on file  Physical Activity:   . Days of Exercise per Week: Not on file  . Minutes of Exercise per Session: Not on file  Stress:   . Feeling of Stress : Not on file  Social Connections:   . Frequency of Communication with Friends and Family: Not on file  . Frequency of Social Gatherings with Friends and Family: Not on file  . Attends Religious Services: Not on file  . Active Member of Clubs or Organizations: Not on file  . Attends Archivist Meetings:  Not on file  . Marital Status: Not on file  Intimate Partner Violence:   . Fear of Current or Ex-Partner: Not on file  . Emotionally Abused: Not on file  . Physically Abused: Not on file  . Sexually Abused: Not on file    Outpatient Medications Prior to Visit  Medication Sig Dispense Refill  . Cholecalciferol (VITAMIN D3) 125 MCG (5000 UT) TABS 5,000 IU OTC vitamin D3 daily. 90 tablet 3  . levothyroxine (SYNTHROID) 50 MCG tablet Take 1 tablet (50 mcg total) by mouth daily before breakfast. 90 tablet 1  . blood glucose meter kit and supplies KIT Dispense based on patient and insurance preference. Use up to four times daily as directed. (FOR ICD-9 250.00, 250.01). 1 each 0  . levocetirizine (XYZAL) 5 MG tablet Take 1 tablet (5 mg total) by mouth every evening. For seasonal  allergies (Patient not taking: Reported on 08/20/2020) 90 tablet 1  . ONETOUCH ULTRA test strip TEST UP TO 4 TIMES A DAY 100 strip 11   No facility-administered medications prior to visit.    Allergies  Allergen Reactions  . Hydrocodone-Acetaminophen Other (See Comments)    REACTION: atrial fibrilation  . Oxycodone Other (See Comments)    Atrial fibrilation  . Oxycodone-Acetaminophen Other (See Comments)    REACTION: atrial fibrilation   ok Darvocet    ROS Review of Systems  A fourteen system review of systems was performed and found to be positive as per HPI.   Objective:    Physical Exam General:  Well Developed, well nourished, appropriate for stated age.  Neuro:  Alert and oriented,  extra-ocular muscles intact  HEENT:  Normocephalic, atraumatic, neck supple, no JVD Skin:  no gross rash, warm, pink. Cardiac:  RRR, S1 S2, no murmur  Respiratory:  ECTA B/L and A/P, Not using accessory muscles, speaking in full sentences- unlabored. Vascular:  Ext warm, no cyanosis apprec.; cap RF less 2 sec. Psych:  No HI/SI, judgement and insight good, Euthymic mood. Full Affect.   BP 136/81   Pulse 75   Ht 5' 5.5" (1.664 m)   Wt 157 lb 12.8 oz (71.6 kg)   SpO2 96%   BMI 25.86 kg/m  Wt Readings from Last 3 Encounters:  08/20/20 157 lb 12.8 oz (71.6 kg)  04/27/20 159 lb 14.4 oz (72.5 kg)  12/23/19 160 lb (72.6 kg)     Health Maintenance Due  Topic Date Due  . COVID-19 Vaccine (1) Never done  . PNA vac Low Risk Adult (2 of 2 - PCV13) 06/23/2009  . TETANUS/TDAP  06/23/2018  . INFLUENZA VACCINE  Never done    There are no preventive care reminders to display for this patient.  Lab Results  Component Value Date   TSH 1.620 04/25/2020   Lab Results  Component Value Date   WBC 8.5 08/18/2019   HGB 14.6 08/18/2019   HCT 43.7 08/18/2019   MCV 91 08/18/2019   PLT 144 (L) 08/18/2019   Lab Results  Component Value Date   NA 142 08/18/2019   K 4.2 08/18/2019   CO2 23  08/18/2019   GLUCOSE 163 (H) 08/18/2019   BUN 21 08/18/2019   CREATININE 1.24 08/18/2019   BILITOT 1.0 08/18/2019   ALKPHOS 103 08/18/2019   AST 15 08/18/2019   ALT 20 08/18/2019   PROT 6.6 08/18/2019   ALBUMIN 4.2 08/18/2019   CALCIUM 11.8 (H) 08/18/2019   ANIONGAP 6 08/01/2017   GFR 95.86 07/31/2015   Lab Results  Component Value Date   CHOL 146 08/18/2019   Lab Results  Component Value Date   HDL 38 (L) 08/18/2019   Lab Results  Component Value Date   LDLCALC 82 08/18/2019   Lab Results  Component Value Date   TRIG 148 08/18/2019   Lab Results  Component Value Date   CHOLHDL 3.8 08/18/2019   Lab Results  Component Value Date   HGBA1C 7.5 (A) 08/20/2020      Assessment & Plan:   Problem List Items Addressed This Visit      Cardiovascular and Mediastinum   H/O ATRIAL FIBRILLATION, PAROXYSMAL (Chronic)     Endocrine   Diet-controlled diabetes mellitus ([place]) - Primary (Chronic)   Relevant Orders   POCT glycosylated hemoglobin (Hb A1C) (Completed)     Other   BPH associated with nocturia (Chronic)   Constipation    Other Visit Diagnoses    Hyperlipidemia associated with type 2 diabetes mellitus ([place])       Dysuria       Relevant Orders   POCT urinalysis dipstick (Completed)   Urine Culture     Diet controlled diabetes mellitus: -A1c today is 7.5 -Discussed with patient A1c goal given his age and other co-morbidities would be 7.0-7.5 and recommend to reduce sweets and increase physical activity such as walking for 15 minutes to keep it stable. -Will continue to monitor.  Hyperlipidemia associated with type 2 diabetes mellitus: -Last lipid panel: HDL 38 mg/dL, LDL 82 mg/dL (goal <70 mg/dL) -Recommend to be more active and follow a heart healthy diet low in saturated and trans fat. Provided handout. -Schedule lab visit for fasting blood work (pt non fasting today)  PAF: -Followed by Cardiology. -Reviewed last OV, and patient was advised to follow  up in 6 months so recommend to schedule follow up appointment.  BPH with nocturia, Dysuria: -Will collect UA to evaluate for any signs of UTI and will start empiric antibiotic if indicated and send for urine culture. -Recommend to follow up with Urologist.  Constipation: -Recommend to increase water consumption and dietary fiber to help with constipation. -Will continue to monitor. If constipation worsens, recommend to use Miralax.   No orders of the defined types were placed in this encounter.   Follow-up: Return in about 4 months (around 12/20/2020) for [facility]; lab visit for FBW 1-2 weeks.   Note:  This note was prepared with assistance of Dragon voice recognition software. Occasional wrong-word or sound-a-like substitutions may have occurred due to the inherent limitations of voice recognition software.   Lorrene Reid, PA-C

## 2020-08-22 LAB — URINE CULTURE

## 2020-08-24 ENCOUNTER — Other Ambulatory Visit: Payer: Self-pay | Admitting: Physician Assistant

## 2020-08-24 DIAGNOSIS — E119 Type 2 diabetes mellitus without complications: Secondary | ICD-10-CM

## 2020-08-24 DIAGNOSIS — E1169 Type 2 diabetes mellitus with other specified complication: Secondary | ICD-10-CM

## 2020-08-24 DIAGNOSIS — R7989 Other specified abnormal findings of blood chemistry: Secondary | ICD-10-CM

## 2020-08-24 DIAGNOSIS — E0590 Thyrotoxicosis, unspecified without thyrotoxic crisis or storm: Secondary | ICD-10-CM

## 2020-08-24 DIAGNOSIS — E785 Hyperlipidemia, unspecified: Secondary | ICD-10-CM

## 2020-08-27 ENCOUNTER — Other Ambulatory Visit: Payer: Self-pay

## 2020-08-27 ENCOUNTER — Other Ambulatory Visit: Payer: Medicare HMO

## 2020-08-27 DIAGNOSIS — E0590 Thyrotoxicosis, unspecified without thyrotoxic crisis or storm: Secondary | ICD-10-CM

## 2020-08-27 DIAGNOSIS — E119 Type 2 diabetes mellitus without complications: Secondary | ICD-10-CM

## 2020-08-27 DIAGNOSIS — R7989 Other specified abnormal findings of blood chemistry: Secondary | ICD-10-CM

## 2020-08-27 DIAGNOSIS — E1169 Type 2 diabetes mellitus with other specified complication: Secondary | ICD-10-CM | POA: Diagnosis not present

## 2020-08-27 DIAGNOSIS — E785 Hyperlipidemia, unspecified: Secondary | ICD-10-CM | POA: Diagnosis not present

## 2020-08-28 LAB — COMPREHENSIVE METABOLIC PANEL
ALT: 22 IU/L (ref 0–44)
AST: 17 IU/L (ref 0–40)
Albumin/Globulin Ratio: 1.5 (ref 1.2–2.2)
Albumin: 4.1 g/dL (ref 3.7–4.7)
Alkaline Phosphatase: 92 IU/L (ref 44–121)
BUN/Creatinine Ratio: 15 (ref 10–24)
BUN: 14 mg/dL (ref 8–27)
Bilirubin Total: 0.7 mg/dL (ref 0.0–1.2)
CO2: 26 mmol/L (ref 20–29)
Calcium: 12.1 mg/dL — ABNORMAL HIGH (ref 8.6–10.2)
Chloride: 102 mmol/L (ref 96–106)
Creatinine, Ser: 0.94 mg/dL (ref 0.76–1.27)
GFR calc Af Amer: 88 mL/min/{1.73_m2} (ref >59)
GFR calc non Af Amer: 76 mL/min/{1.73_m2} (ref >59)
Globulin, Total: 2.7 g/dL (ref 1.5–4.5)
Glucose: 136 mg/dL — ABNORMAL HIGH (ref 65–99)
Potassium: 4.1 mmol/L (ref 3.5–5.2)
Sodium: 139 mmol/L (ref 134–144)
Total Protein: 6.8 g/dL (ref 6.0–8.5)

## 2020-08-28 LAB — LIPID PANEL
Chol/HDL Ratio: 3.9 ratio (ref 0.0–5.0)
Cholesterol, Total: 144 mg/dL (ref 100–199)
HDL: 37 mg/dL — ABNORMAL LOW (ref >39)
LDL Chol Calc (NIH): 87 mg/dL (ref 0–99)
Triglycerides: 106 mg/dL (ref 0–149)
VLDL Cholesterol Cal: 20 mg/dL (ref 5–40)

## 2020-08-28 LAB — CBC
Hematocrit: 43.3 % (ref 37.5–51.0)
Hemoglobin: 14.7 g/dL (ref 13.0–17.7)
MCH: 30.8 pg (ref 26.6–33.0)
MCHC: 33.9 g/dL (ref 31.5–35.7)
MCV: 91 fL (ref 79–97)
Platelets: 148 10*3/uL — ABNORMAL LOW (ref 150–450)
RBC: 4.78 x10E6/uL (ref 4.14–5.80)
RDW: 12.5 % (ref 11.6–15.4)
WBC: 8 10*3/uL (ref 3.4–10.8)

## 2020-08-28 LAB — HEMOGLOBIN A1C
Est. average glucose Bld gHb Est-mCnc: 160 mg/dL
Hgb A1c MFr Bld: 7.2 % — ABNORMAL HIGH (ref 4.8–5.6)

## 2020-08-28 LAB — TSH: TSH: 1.71 u[IU]/mL (ref 0.450–4.500)

## 2020-09-24 DIAGNOSIS — H401131 Primary open-angle glaucoma, bilateral, mild stage: Secondary | ICD-10-CM | POA: Diagnosis not present

## 2020-09-24 DIAGNOSIS — H40053 Ocular hypertension, bilateral: Secondary | ICD-10-CM | POA: Diagnosis not present

## 2020-09-26 DIAGNOSIS — H401122 Primary open-angle glaucoma, left eye, moderate stage: Secondary | ICD-10-CM | POA: Diagnosis not present

## 2020-10-01 DIAGNOSIS — R351 Nocturia: Secondary | ICD-10-CM | POA: Diagnosis not present

## 2020-10-01 DIAGNOSIS — N401 Enlarged prostate with lower urinary tract symptoms: Secondary | ICD-10-CM | POA: Diagnosis not present

## 2020-11-13 ENCOUNTER — Encounter: Payer: Self-pay | Admitting: Physician Assistant

## 2020-11-13 ENCOUNTER — Other Ambulatory Visit: Payer: Self-pay

## 2020-11-13 ENCOUNTER — Ambulatory Visit (INDEPENDENT_AMBULATORY_CARE_PROVIDER_SITE_OTHER): Payer: Medicare HMO | Admitting: Physician Assistant

## 2020-11-13 ENCOUNTER — Ambulatory Visit
Admission: RE | Admit: 2020-11-13 | Discharge: 2020-11-13 | Disposition: A | Discharge status: Home / Self Care | Payer: Medicare HMO | Source: Ambulatory Visit | Attending: Physician Assistant | Admitting: Physician Assistant

## 2020-11-13 VITALS — Temp 97.7°F | Ht 65.5 in | Wt 151.2 lb

## 2020-11-13 DIAGNOSIS — J22 Unspecified acute lower respiratory infection: Secondary | ICD-10-CM

## 2020-11-13 DIAGNOSIS — R059 Cough, unspecified: Secondary | ICD-10-CM | POA: Diagnosis not present

## 2020-11-13 MED ORDER — AZITHROMYCIN 250 MG PO TABS: ORAL_TABLET | ORAL | 0 refills | Status: DC

## 2020-11-13 MED ORDER — AMOXICILLIN-POT CLAVULANATE 875-125 MG PO TABS: 1.0000 | ORAL_TABLET | Freq: Two times a day (BID) | ORAL | 0 refills | Status: DC

## 2020-11-13 NOTE — Progress Notes (Signed)
Telemedicine Visit via  Audio only - telephone (patient preference /  technical difficulty with MyChart video application)  I connected with Ronald Kelley on 11/13/20 at 2:33 PM  by phone or  telemedicine application as noted above  I verified that I am speaking with or regarding  the correct patient using two identifiers.  Participants: Myself, Nelson Chimes PA-C Patient: Ronald Kelley Patient proxy if applicable: wife was also present at times on the call   Patient is in separate location from myself  I am in office at [facility]   I discussed the limitations of evaluation and management  by telemedicine and the availability of in person appointments.  The participant(s) above expressed understanding and  agreed to proceed with this appointment via telemedicine.     History of Present Illness: Ronald Kelley is a 79 y.o. male who would like to discuss cough  Ronald Kelley is a pleasant 78 YO with PMH of Type 2 DM and paroxysmal afib who presents to discuss persistent cough Reports he has had a bad cough and sinus drainage for approx 3 weeks Cough is productive of yellow phlegm and associated with yellow nasal drainage States he had a negative OTC COVID test early in his illness Denies fever, blood-tinged sputum, dyspnea, wheezing, chest pain, edema Denies hx of CHF Denies personal hx of PNA. Denies underlying pulmonary disease  Denies palpitations/irregular heart beats. Reports Afib is usually triggered by pain medication and caffeine No home blood glucose to report Taking OTC Robitussin, [facility] max strength sinus 12-hr nasal decongestant, and OTC nasal saline spray  Observations/Objective: Temp 97.7 F (36.5 C) (Temporal)    Ht 5' 5.5" (1.664 m)    Wt 151 lb 3.2 oz (68.6 kg)    BMI 24.78 kg/m  BP Readings from Last 3 Encounters:  08/20/20 136/81  04/27/20 120/66  12/23/19 120/61   Exam: Alert and oriented x 3 Speaking in full sentences, unlabored. Normal judgment.    Lab and Radiology Results No results found for this or any previous visit (from the past 72 hour(s)). DG Chest 2 View  Result Date: 11/13/2020 CLINICAL DATA:  Cough and congestion. EXAM: CHEST - 2 VIEW COMPARISON:  07/31/2017. FINDINGS: Mediastinum and hilar structures normal. Mild bibasilar atelectasis/infiltrates. Tiny right pleural effusion cannot be excluded. No pneumothorax. Degenerative changes scoliosis thoracic spine. IMPRESSION: Mild bibasilar atelectasis/infiltrates. Tiny right pleural effusion cannot be excluded. Electronically Signed   By: Marcello Moores  Register   On: 11/13/2020 13:01       Assessment and Plan: 79 y.o. male with The primary encounter diagnosis was Acute lower respiratory infection. A diagnosis of Cough was also pertinent to this visit.   PDMP not reviewed this encounter. Orders Placed This Encounter  Procedures   DG Chest 2 View    Standing Status:   Future    Number of Occurrences:   1    Standing Expiration Date:   11/13/2021    Order Specific Question:   Reason for Exam (SYMPTOM  OR DIAGNOSIS REQUIRED)    Answer:   cough, congestion    Order Specific Question:   Preferred imaging location?    Answer:   [id number]   Meds ordered this encounter  Medications   amoxicillin-clavulanate (AUGMENTIN) 875-125 MG tablet    Sig: Take 1 tablet by mouth 2 (two) times daily.    Dispense:  20 tablet    Refill:  0    Order Specific Question:   Supervising Provider  Answer:   Emeterio Reeve [[id number]]   azithromycin (ZITHROMAX Z-PAK) 250 MG tablet    Sig: Take 2 tablets (500 mg) on  Day 1,  followed by 1 tablet (250 mg) once daily on Days 2 through 5.    Dispense:  6 tablet    Refill:  0    Order Specific Question:   Supervising Provider    Answer:   Emeterio Reeve [[id number]]   There are no Patient Instructions on file for this visit.  Personally reviewed CXR showing bibasilar opacities Treating empirically for community acquired PNA with  Augmentin and Azithromycin Counseled to discontinue OTC decongestant as this can trigger Afib - okay to continue Tussin and saline Follow-up scheduled in office tomorrow at 1:45 pm Counseled on ER precautions  Follow Up Instructions: No follow-ups on file.    I discussed the assessment and treatment plan with the patient. The patient was provided an opportunity to ask questions and all were answered. The patient agreed with the plan and demonstrated an understanding of the instructions.   The patient was advised to call back or seek an in-person evaluation if any new concerns, if symptoms worsen or if the condition fails to improve as anticipated.  19 minutes of non-face-to-face time was provided during this encounter.      . . . . . . . . . . . . . Marland Kitchen                   Historical information moved to improve visibility of documentation.  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  Medications: Current Outpatient Medications  Medication Sig Dispense Refill   Ascorbic Acid (VITAMIN C) 100 MG tablet Take 100 mg by mouth daily.     blood glucose meter kit and supplies KIT Dispense based on patient and insurance preference. Use up to four times daily as directed. (FOR ICD-9 250.00, 250.01). 1 each 0   Cholecalciferol  (VITAMIN D3) 125 MCG (5000 UT) TABS 5,000 IU OTC vitamin D3 daily. 90 tablet 3   ELDERBERRY PO Take by mouth.     levothyroxine (SYNTHROID) 50 MCG tablet Take 1 tablet (50 mcg total) by mouth daily before breakfast. 90 tablet 1   ONETOUCH ULTRA test strip TEST UP TO 4 TIMES A DAY 100 strip 11   Zinc Sulfate (ZINC 15 PO) Take by mouth.     amoxicillin-clavulanate (AUGMENTIN) 875-125 MG tablet Take 1 tablet by mouth 2 (two) times daily. 20 tablet 0   azithromycin (ZITHROMAX Z-PAK) 250 MG tablet Take 2 tablets (500 mg) on  Day 1,  followed by 1 tablet (250 mg) once daily on Days 2 through 5. 6 tablet 0   levocetirizine (XYZAL) 5 MG  tablet Take 1 tablet (5 mg total) by mouth every evening. For seasonal allergies (Patient not taking: Reported on 11/13/2020) 90 tablet 1   No current facility-administered medications for this visit.   Allergies  Allergen Reactions   Hydrocodone-Acetaminophen Other (See Comments)    REACTION: atrial fibrilation   Oxycodone Other (See Comments)    Atrial fibrilation   Oxycodone-Acetaminophen Other (See Comments)    REACTION: atrial fibrilation   ok Darvocet

## 2020-11-14 ENCOUNTER — Encounter: Payer: Self-pay | Admitting: Physician Assistant

## 2020-11-14 ENCOUNTER — Ambulatory Visit (INDEPENDENT_AMBULATORY_CARE_PROVIDER_SITE_OTHER): Payer: Medicare HMO | Admitting: Physician Assistant

## 2020-11-14 VITALS — BP 93/51 | HR 67 | Temp 97.6°F | Ht 65.5 in | Wt 153.1 lb

## 2020-11-14 DIAGNOSIS — J189 Pneumonia, unspecified organism: Secondary | ICD-10-CM

## 2020-11-14 DIAGNOSIS — M5032 Other cervical disc degeneration, mid-cervical region, unspecified level: Secondary | ICD-10-CM | POA: Diagnosis not present

## 2020-11-14 DIAGNOSIS — I951 Orthostatic hypotension: Secondary | ICD-10-CM

## 2020-11-14 DIAGNOSIS — M25831 Other specified joint disorders, right wrist: Secondary | ICD-10-CM

## 2020-11-14 NOTE — Patient Instructions (Addendum)
Continue your antibiotics are prescribed Increase your water intake - at least 6-8 glasses per day Eat foods that are hydrating like soups/broths, fruits Okay to salt your food or choose salty snacks to help maintain your hydration Decrease your caffeine intake  Follow-up with Herb Grays on Monday Follow-up sooner if worsening  Go to the ER for confusion, lethargy, fainting/near fainting, chest pain, shortness of breath, bluish lips/finger tips  Community-Acquired Pneumonia, Adult Pneumonia is a type of lung infection that causes swelling in the airways of the lungs. Mucus and fluid may also build up inside the airways. This may cause coughing and difficulty breathing. There are different types of pneumonia. One type can develop while a person is in a hospital. A different type is called community-acquired pneumonia. It develops in people who are not, and have not recently been, in the hospital or another type of health care facility. What are the causes? This condition may be caused by:  Viruses. This is the most common cause of pneumonia.  Bacteria. Community-acquired pneumonia is often caused by Streptococcus pneumoniae bacteria. These bacteria are often passed from one person to another by breathing in droplets from the cough or sneeze of an infected person.  Fungi. This is the least common cause of pneumonia. What increases the risk? The following factors may make you more likely to develop this condition:  Having a chronic disease, such as chronic obstructive pulmonary disease (COPD), asthma, congestive heart failure, cystic fibrosis, diabetes, or kidney disease.  Having early-stage or late-stage HIV.  Having sickle cell disease.  Having had your spleen removed (splenectomy).  Having poor dental hygiene.  Having a medical condition that increases the risk of breathing in (aspirating) secretions from your own mouth and nose.  Having a weakened body defense system (immune  system).  Being a smoker.  Traveling to areas where pneumonia-causing germs commonly exist.  Being around animal habitats or animals that have pneumonia-causing germs, including birds, bats, rabbits, cats, and farm animals. What are the signs or symptoms? Symptoms of this condition include:  A dry cough.  A wet (productive) cough.  Fever.  Sweating.  Chest pain, especially when breathing deeply or coughing.  Rapid breathing or difficulty breathing.  Shortness of breath.  Shaking chills.  Fatigue.  Muscle aches. How is this diagnosed? This condition may be diagnosed based on:  Your medical history.  A physical exam. You may also have tests, including:  Chest X-rays.  Tests of your blood oxygen level and other blood gases.  Tests on blood, mucus (sputum), fluid around your lungs (pleural fluid), and urine. If your pneumonia is severe, other tests may be done to find the exact cause of your illness. How is this treated? Treatment for this condition depends on many factors, such as the cause of your pneumonia, the medicines you take, and other medical conditions that you have. For most adults, treatment and recovery from pneumonia may occur at home. In some cases, treatment must happen in a hospital. Treatment may include:  Medicines that are given by mouth or through an IV, including: ? Antibiotic medicines, if the pneumonia was caused by bacteria. ? Antiviral medicines, if the pneumonia was caused by a virus.  Being given extra oxygen.  Respiratory therapy. Although rare, treating severe pneumonia may include:  Using a machine to help you breathe (mechanical ventilation). This is done if you are not breathing well on your own and you cannot maintain a safe blood oxygen level.  Thoracentesis. This is a  procedure to remove fluid from around one lung or both lungs to help you breathe better. Follow these instructions at home:  Medicines  Take over-the-counter  and prescription medicines only as told by your health care provider. ? Only take cough medicine if you are losing sleep. Be aware that cough medicine can prevent your body's natural ability to remove mucus from your lungs.  If you were prescribed an antibiotic medicine, take it as told by your health care provider. Do not stop taking the antibiotic even if you start to feel better. General instructions  Sleep in a semi-upright position at night. Try sleeping in a reclining chair, or place a few pillows under your head.  Rest as needed and get at least 8 hours of sleep each night.  Drink enough water to keep your urine pale yellow. This will help to thin out mucus secretions in your lungs.  Eat a healthy diet that includes plenty of vegetables, fruits, whole grains, low-fat dairy products, and lean protein.  Do not use any products that contain nicotine or tobacco, such as cigarettes, e-cigarettes, and chewing tobacco. If you need help quitting, ask your health care provider.  Keep all follow-up visits as told by your health care provider. This is important. How is this prevented? You can lower your risk of developing community-acquired pneumonia by:  Getting a pneumococcal vaccine. There are different types and schedules of pneumococcal vaccines. Ask your health care provider which option is best for you. Consider getting the vaccine if: ? You are older than 80 years of age. ? You are older than 80 years of age and are undergoing cancer treatment, have chronic lung disease, or have other medical conditions that affect your immune system. Ask your health care provider if this applies to you.  Getting an influenza vaccine every year. Ask your health care provider which type of vaccine is best for you.  Getting regular checkups from your dentist.  Washing your hands often. If soap and water are not available, use hand sanitizer. Contact a health care provider if:  You have a  fever.  You are losing sleep because you cannot control your cough with cough medicine. Get help right away if:  You have worsening shortness of breath.  You have increased chest pain.  Your sickness becomes worse, especially if you are an older adult or have a weakened immune system.  You cough up blood. Summary  Pneumonia is an infection of the lungs.  Community-acquired pneumonia develops in people who have not been in the hospital. It can be caused by bacteria, viruses, or fungi.  This condition may be treated with antibiotics or antiviral medicines.  Severe cases may require hospitalization, mechanical ventilation, and other procedures to drain fluid from the lungs. This information is not intended to replace advice given to you by your health care provider. Make sure you discuss any questions you have with your health care provider. Document Revised: 07/15/2018 Document Reviewed: 07/15/2018 Elsevier Patient Education  [place].

## 2020-11-14 NOTE — Progress Notes (Signed)
Established Patient Office Visit  Subjective:  Patient ID: Ronald Kelley, male    DOB: 1940/12/17  Age: 79 y.o. MRN: [id number]  CC:  Chief Complaint  Patient presents with  . Pneumonia    HPI Ronald Kelley presents for follow-up pneumonia  He is accompanied by his wife today  Ronald Kelley is a pleasant 81 YO with PMH of T2DM and PAF who was evaluated by telehealth yesterday for cough/rhinorrhea x 3 weeks. Reports he has had a bad cough and sinus drainage for approx 3 weeks Cough is productive of yellow phlegm and associated with yellow nasal drainage States he had a negative OTC COVID test early in his illness CXR showed bibasilar infiltrates and possible pleural effusion He was treated empirically for CAP with Azithromycin and Augmentin Scheduled for in-person evaluation today Reports no change in symptoms today Endorses generalized weakness - no positional dizziness or lightheadedness. No syncope Endorses decreased appetite and PO intake - drinking mostly coffee and sprite. Taking meds with a couple sips of water, but wife states not drinking enough  He also reports for the last year he has had a slightly tender lump on his right wrist, gradually increasing in size.   Denies fever, blood-tinged sputum, dyspnea, wheezing, chest pain, edema Denies hx of CHF Denies personal hx of PNA. Denies underlying pulmonary disease  Denies palpitations/irregular heart beats. Reports Afib is usually triggered by pain medication and caffeine  Past Medical History:  Diagnosis Date  . Atrial fib/flutter, transient   . Degenerative disk disease   . Diabetes mellitus    a1c 6.5, 07/2008  . Hyperthyroidism    remote h/o , no ablation  . OSA on CPAP     Past Surgical History:  Procedure Laterality Date  . HERNIA REPAIR  2010  . 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 ROTATOR CUFF REPAIR      Family History  Problem Relation Age of  Onset  . Hypertension Mother   . Diabetes Brother   . Alzheimer's disease Father 40  . Coronary artery disease Neg Hx   . Stroke Neg Hx   . Colon cancer Neg Hx   . Prostate cancer Neg Hx     Social History   Socioeconomic History  . Marital status: Married    Spouse name: Not on file  . Number of children: 3  . Years of education: Not on file  . Highest education level: Not on file  Occupational History  . Occupation: Technical sales engineer-- semi-retirement  Tobacco Use  . Smoking status: Never Smoker  . Smokeless tobacco: Never Used  Vaping Use  . Vaping Use: Never used  Substance and Sexual Activity  . Alcohol use: No  . Drug use: No  . Sexual activity: Not on file  Other Topics Concern  . Not on file  Social History Narrative   Married to Ronald Kelley of Health   Financial Resource Strain: Not on file  Food Insecurity: Not on file  Transportation Needs: Not on file  Physical Activity: Not on file  Stress: Not on file  Social Connections: Not on file  Intimate Partner Violence: Not on file    Outpatient Medications Prior to Visit  Medication Sig Dispense Refill  . amoxicillin-clavulanate (AUGMENTIN) 875-125 MG tablet Take 1 tablet by mouth 2 (two) times daily. 20 tablet 0  . Ascorbic Acid (VITAMIN  C) 100 MG tablet Take 100 mg by mouth daily.    Marland Kitchen azithromycin (ZITHROMAX Z-PAK) 250 MG tablet Take 2 tablets (500 mg) on  Day 1,  followed by 1 tablet (250 mg) once daily on Days 2 through 5. 6 tablet 0  . blood glucose meter kit and supplies KIT Dispense based on patient and insurance preference. Use up to four times daily as directed. (FOR ICD-9 250.00, 250.01). 1 each 0  . Cholecalciferol (VITAMIN D3) 125 MCG (5000 UT) TABS 5,000 IU OTC vitamin D3 daily. 90 tablet 3  . ELDERBERRY PO Take by mouth.    . levocetirizine (XYZAL) 5 MG tablet Take 1 tablet (5 mg total) by mouth every evening. For seasonal allergies (Patient not taking: Reported on  11/13/2020) 90 tablet 1  . levothyroxine (SYNTHROID) 50 MCG tablet Take 1 tablet (50 mcg total) by mouth daily before breakfast. 90 tablet 1  . ONETOUCH ULTRA test strip TEST UP TO 4 TIMES A DAY 100 strip 11  . Zinc Sulfate (ZINC 15 PO) Take by mouth.     No facility-administered medications prior to visit.    Allergies  Allergen Reactions  . Hydrocodone-Acetaminophen Other (See Comments)    REACTION: atrial fibrilation  . Oxycodone Other (See Comments)    Atrial fibrilation  . Oxycodone-Acetaminophen Other (See Comments)    REACTION: atrial fibrilation   ok Darvocet    ROS Review of Systems  Constitutional: Positive for appetite change (decreased).  HENT: Positive for postnasal drip and rhinorrhea.   Respiratory: Positive for cough.   Neurological: Positive for weakness.  All other systems reviewed and are negative.     Objective:    Physical Exam Vitals (orthostatics reviewed) reviewed.  Constitutional:      Appearance: Normal appearance. He is well-groomed. He is not ill-appearing or toxic-appearing.  HENT:     Nose: Nose normal.     Right Turbinates: Not enlarged.     Left Turbinates: Not enlarged.     Right Sinus: No maxillary sinus tenderness.     Left Sinus: No maxillary sinus tenderness.  Eyes:     Extraocular Movements: Extraocular movements intact.     Conjunctiva/sclera: Conjunctivae normal.  Cardiovascular:     Rate and Rhythm: Normal rate and regular rhythm.     Pulses: Normal pulses.     Heart sounds: Normal heart sounds. No friction rub.  Pulmonary:     Effort: Pulmonary effort is normal. No tachypnea.     Breath sounds: Normal breath sounds. No wheezing, rhonchi or rales.  Musculoskeletal:       Arms:     Right lower leg: No edema.     Left lower leg: No edema.  Lymphadenopathy:     Cervical: No cervical adenopathy.  Neurological:     Mental Status: He is alert.  Psychiatric:        Mood and Affect: Mood and affect normal.        Speech:  Speech normal.        Behavior: Behavior normal.        Thought Content: Thought content normal.     BP (!) 93/51   Pulse 67   Temp 97.6 F (36.4 C) (Oral)   Ht 5' 5.5" (1.664 m)   Wt 153 lb 1.6 oz (69.4 kg)   SpO2 100% Comment: on RA  BMI 25.09 kg/m  Wt Readings from Last 3 Encounters:  11/14/20 153 lb 1.6 oz (69.4 kg)  11/13/20 151 lb 3.2 oz (  68.6 kg)  08/20/20 157 lb 12.8 oz (71.6 kg)   BP Readings from Last 3 Encounters:  11/14/20 (!) 93/51  08/20/20 136/81  04/27/20 120/66   No data found.   CLINICAL DATA:  Cough and congestion.  EXAM: CHEST - 2 VIEW  COMPARISON:  07/31/2017.  FINDINGS: Mediastinum and hilar structures normal. Mild bibasilar atelectasis/infiltrates. Tiny right pleural effusion cannot be excluded. No pneumothorax. Degenerative changes scoliosis thoracic spine.  IMPRESSION: Mild bibasilar atelectasis/infiltrates. Tiny right pleural effusion cannot be excluded.   Electronically Signed   By: [place]   On: 11/13/2020 13:01  Health Maintenance Due  Topic Date Due  . COVID-19 Vaccine (1) Never done  . PNA vac Low Risk Adult (2 of 2 - PCV13) 06/23/2009  . TETANUS/TDAP  06/23/2018  . INFLUENZA VACCINE  Never done    There are no preventive care reminders to display for this patient.  Lab Results  Component Value Date   TSH 1.710 08/27/2020   Lab Results  Component Value Date   WBC 10.2 11/14/2020   HGB 14.0 11/14/2020   HCT 40.6 11/14/2020   MCV 89 11/14/2020   PLT 285 11/14/2020   Lab Results  Component Value Date   NA 137 11/14/2020   K 4.4 11/14/2020   CO2 29 11/14/2020   GLUCOSE 155 (H) 11/14/2020   BUN 18 11/14/2020   CREATININE 1.27 11/14/2020   BILITOT 1.3 (H) 11/14/2020   ALKPHOS 88 11/14/2020   AST 50 (H) 11/14/2020   ALT 78 (H) 11/14/2020   PROT 6.6 11/14/2020   ALBUMIN 3.7 11/14/2020   CALCIUM 12.5 (H) 11/14/2020   ANIONGAP 6 08/01/2017   GFR 95.86 07/31/2015   Lab Results  Component  Value Date   CHOL 144 08/27/2020   Lab Results  Component Value Date   HDL 37 (L) 08/27/2020   Lab Results  Component Value Date   LDLCALC 87 08/27/2020   Lab Results  Component Value Date   TRIG 106 08/27/2020   Lab Results  Component Value Date   CHOLHDL 3.9 08/27/2020   Lab Results  Component Value Date   HGBA1C 7.2 (H) 08/27/2020      Assessment & Plan:   Problem List Items Addressed This Visit      Cardiovascular and Mediastinum   Orthostatic hypotension   Relevant Orders   CBC with Differential/Platelet (Completed)   Comprehensive metabolic panel (Completed)     Respiratory   Community acquired pneumonia, bilateral - Primary   Relevant Orders   CBC with Differential/Platelet (Completed)   Comprehensive metabolic panel (Completed)     Other   Mass of joint of right wrist   Relevant Orders   Ambulatory referral to Sports Medicine      No orders of the defined types were placed in this encounter.  1. Bilateral lower lobe pneumonia Afebrile, no tachypnea, no tachycardia, no hypoxia, no adventitious lung sounds Mild hypotension noted - 93/51 Orthostatics performed -  there was a 21 mmHg drop in SBP from sitting to standing Patient's PO intake and endorses generalized weakness without syncope or dizziness CBC/d and CMP pending Counseled patient to push PO fluids, liberalize dietary salt, decrease caffeine intake, take time with position changes, take antibiotics with a meal Counseled patient and wife extensively on ER precautions Follow-up on Monday  2. Mass of right wrist Discussed differential diagnosis to include ganglion cyst, neoplasm Referral to Sports Med for further evaluation  Follow-up: Return in about 5 days (around 11/19/2020) for  PNA.    Trixie Dredge, PA-C

## 2020-11-15 ENCOUNTER — Telehealth: Payer: Self-pay | Admitting: Physician Assistant

## 2020-11-15 LAB — COMPREHENSIVE METABOLIC PANEL
ALT: 78 IU/L — ABNORMAL HIGH (ref 0–44)
AST: 50 IU/L — ABNORMAL HIGH (ref 0–40)
Albumin/Globulin Ratio: 1.3 (ref 1.2–2.2)
Albumin: 3.7 g/dL (ref 3.7–4.7)
Alkaline Phosphatase: 88 IU/L (ref 44–121)
BUN/Creatinine Ratio: 14 (ref 10–24)
BUN: 18 mg/dL (ref 8–27)
Bilirubin Total: 1.3 mg/dL — ABNORMAL HIGH (ref 0.0–1.2)
CO2: 29 mmol/L (ref 20–29)
Calcium: 12.5 mg/dL — ABNORMAL HIGH (ref 8.6–10.2)
Chloride: 97 mmol/L (ref 96–106)
Creatinine, Ser: 1.27 mg/dL (ref 0.76–1.27)
GFR calc Af Amer: 61 mL/min/{1.73_m2} (ref >59)
GFR calc non Af Amer: 53 mL/min/{1.73_m2} — ABNORMAL LOW (ref >59)
Globulin, Total: 2.9 g/dL (ref 1.5–4.5)
Glucose: 155 mg/dL — ABNORMAL HIGH (ref 65–99)
Potassium: 4.4 mmol/L (ref 3.5–5.2)
Sodium: 137 mmol/L (ref 134–144)
Total Protein: 6.6 g/dL (ref 6.0–8.5)

## 2020-11-15 LAB — CBC WITH DIFFERENTIAL/PLATELET
Basophils Absolute: 0 10*3/uL (ref 0.0–0.2)
Basos: 0 %
EOS (ABSOLUTE): 0.1 10*3/uL (ref 0.0–0.4)
Eos: 1 %
Hematocrit: 40.6 % (ref 37.5–51.0)
Hemoglobin: 14 g/dL (ref 13.0–17.7)
Immature Grans (Abs): 0.1 10*3/uL (ref 0.0–0.1)
Immature Granulocytes: 1 %
Lymphocytes Absolute: 2.6 10*3/uL (ref 0.7–3.1)
Lymphs: 25 %
MCH: 30.6 pg (ref 26.6–33.0)
MCHC: 34.5 g/dL (ref 31.5–35.7)
MCV: 89 fL (ref 79–97)
Monocytes Absolute: 0.8 10*3/uL (ref 0.1–0.9)
Monocytes: 8 %
Neutrophils Absolute: 6.6 10*3/uL (ref 1.4–7.0)
Neutrophils: 65 %
Platelets: 285 10*3/uL (ref 150–450)
RBC: 4.57 x10E6/uL (ref 4.14–5.80)
RDW: 11.9 % (ref 11.6–15.4)
WBC: 10.2 10*3/uL (ref 3.4–10.8)

## 2020-11-15 NOTE — Telephone Encounter (Signed)
-----   Message from [place], [place] sent at 11/15/2020 12:07 PM EST ----- Cecil Cranker, There are a couple abnormalities on your labs from yesterday that Herb Grays is going to re-check at your follow-up on Monday Your calcium is high at 12.5 - it looks like this is a chronic problem for you, but this is the highest it has been and should be monitored and we will need to check your parathyroid hormone and magnesium levels  Your kidney function is decreased  - this is probably a combination of your illness and not hydrating adequately. Very important you follow the recommendations I gave you for fluid intake yesterday. If you are not able to hydrate or experience any worsening symptoms, you should go to the nearest ER

## 2020-11-15 NOTE — Progress Notes (Signed)
Hi Ronald Kelley, There are a couple abnormalities on your labs from yesterday that Herb Grays is going to re-check at your follow-up on Monday Your calcium is high at 12.5 - it looks like this is a chronic problem for you, but this is the highest it has been and should be monitored and we will need to check your parathyroid hormone and magnesium levels  Your kidney function is decreased  - this is probably a combination of your illness and not hydrating adequately. Very important you follow the recommendations I gave you for fluid intake yesterday. If you are not able to hydrate or experience any worsening symptoms, you should go to the nearest ER

## 2020-11-18 DIAGNOSIS — J189 Pneumonia, unspecified organism: Secondary | ICD-10-CM | POA: Insufficient documentation

## 2020-11-18 DIAGNOSIS — M25831 Other specified joint disorders, right wrist: Secondary | ICD-10-CM | POA: Insufficient documentation

## 2020-11-18 DIAGNOSIS — I951 Orthostatic hypotension: Secondary | ICD-10-CM | POA: Insufficient documentation

## 2020-11-20 ENCOUNTER — Ambulatory Visit (INDEPENDENT_AMBULATORY_CARE_PROVIDER_SITE_OTHER): Payer: Medicare HMO | Admitting: Physician Assistant

## 2020-11-20 ENCOUNTER — Other Ambulatory Visit: Payer: Self-pay

## 2020-11-20 ENCOUNTER — Encounter: Payer: Self-pay | Admitting: Physician Assistant

## 2020-11-20 VITALS — BP 131/73 | HR 68 | Ht 65.5 in | Wt 160.8 lb

## 2020-11-20 DIAGNOSIS — J189 Pneumonia, unspecified organism: Secondary | ICD-10-CM | POA: Diagnosis not present

## 2020-11-20 DIAGNOSIS — R12 Heartburn: Secondary | ICD-10-CM | POA: Diagnosis not present

## 2020-11-20 NOTE — Patient Instructions (Signed)
Community-Acquired Pneumonia, Adult Pneumonia is an infection of the lungs. It causes swelling in the airways of the lungs. Mucus and fluid may also build up inside the airways. One type of pneumonia can happen while a person is in a hospital. A different type can happen when a person is not in a hospital (community-acquired pneumonia).  What are the causes?  This condition is caused by germs (viruses, bacteria, or fungi). Some types of germs can be passed from one person to another. This can happen when you breathe in droplets from the cough or sneeze of an infected person. What increases the risk? You are more likely to develop this condition if you:  Have a long-term (chronic) disease, such as: ? Chronic obstructive pulmonary disease (COPD). ? Asthma. ? Cystic fibrosis. ? Congestive heart failure. ? Diabetes. ? Kidney disease.  Have HIV.  Have sickle cell disease.  Have had your spleen removed.  Do not take good care of your teeth and mouth (poor dental hygiene).  Have a medical condition that increases the risk of breathing in droplets from your own mouth and nose.  Have a weakened body defense system (immune system).  Are a smoker.  Travel to areas where the germs that cause this illness are common.  Are around certain animals or the places they live. What are the signs or symptoms?  A dry cough.  A wet (productive) cough.  Fever.  Sweating.  Chest pain. This often happens when breathing deeply or coughing.  Fast breathing or trouble breathing.  Shortness of breath.  Shaking chills.  Feeling tired (fatigue).  Muscle aches. How is this treated? Treatment for this condition depends on many things. Most adults can be treated at home. In some cases, treatment must happen in a hospital. Treatment may include:  Medicines given by mouth or through an IV tube.  Being given extra oxygen.  Respiratory therapy. In rare cases, treatment for very bad pneumonia  may include:  Using a machine to help you breathe.  Having a procedure to remove fluid from around your lungs. Follow these instructions at home: Medicines  Take over-the-counter and prescription medicines only as told by your doctor. ? Only take cough medicine if you are losing sleep.  If you were prescribed an antibiotic medicine, take it as told by your doctor. Do not stop taking the antibiotic even if you start to feel better. General instructions   Sleep with your head and neck raised (elevated). You can do this by sleeping in a recliner or by putting a few pillows under your head.  Rest as needed. Get at least 8 hours of sleep each night.  Drink enough water to keep your pee (urine) pale yellow.  Eat a healthy diet that includes plenty of vegetables, fruits, whole grains, low-fat dairy products, and lean protein.  Do not use any products that contain nicotine or tobacco. These include cigarettes, e-cigarettes, and chewing tobacco. If you need help quitting, ask your doctor.  Keep all follow-up visits as told by your doctor. This is important. How is this prevented? A shot (vaccine) can help prevent pneumonia. Shots are often suggested for:  People older than 80 years of age.  People older than 80 years of age who: ? Are having cancer treatment. ? Have long-term (chronic) lung disease. ? Have problems with their body's defense system. You may also prevent pneumonia if you take these actions:  Get the flu (influenza) shot every year.  Go to the dentist as   often as told.  Wash your hands often. If you cannot use soap and water, use hand sanitizer. Contact a doctor if:  You have a fever.  You lose sleep because your cough medicine does not help. Get help right away if:  You are short of breath and it gets worse.  You have more chest pain.  Your sickness gets worse. This is very serious if: ? You are an older adult. ? Your body's defense system is weak.  You  cough up blood. Summary  Pneumonia is an infection of the lungs.  Most adults can be treated at home. Some will need treatment in a hospital.  Drink enough water to keep your pee pale yellow.  Get at least 8 hours of sleep each night. This information is not intended to replace advice given to you by your health care provider. Make sure you discuss any questions you have with your health care provider. Document Revised: 03/09/2019 Document Reviewed: 07/15/2018 Elsevier Patient Education  2020 [employer] Inc[employer]  

## 2020-11-20 NOTE — Progress Notes (Signed)
Established Patient Office Visit  Subjective:  Patient ID: Ronald Kelley, male    DOB: 12-Jun-1941  Age: 80 y.o. MRN: [id number]  CC:  Chief Complaint  Patient presents with  . Pneumonia    HPI Ronald Kelley presents for follow up on pneumonia. Reports is feeling better.  Cough has improved.  States does not feel as weak as last week. Has increased water consumption but could do better. Has started drinking decaf coffee to help reduce caffeine consumption. Denies fever, chest pain, shortness of breath, or edema.  Has completed azithromycin and continues to take Augmentin.  Elevated calcium: No prior work-up for chronic hypercalcemia. Reports does take excessive Tums for heartburn.  Past Medical History:  Diagnosis Date  . Atrial fib/flutter, transient   . Degenerative disk disease   . Diabetes mellitus    a1c 6.5, 07/2008  . Hyperthyroidism    remote h/o , no ablation  . OSA on CPAP     Past Surgical History:  Procedure Laterality Date  . HERNIA REPAIR  2010  . 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 ROTATOR CUFF REPAIR      Family History  Problem Relation Age of Onset  . Hypertension Mother   . Diabetes Brother   . Alzheimer's disease Father 74  . Coronary artery disease Neg Hx   . Stroke Neg Hx   . Colon cancer Neg Hx   . Prostate cancer Neg Hx     Social History   Socioeconomic History  . Marital status: Married    Spouse name: Not on file  . Number of children: 3  . Years of education: Not on file  . Highest education level: Not on file  Occupational History  . Occupation: Technical sales engineer-- semi-retirement  Tobacco Use  . Smoking status: Never Smoker  . Smokeless tobacco: Never Used  Vaping Use  . Vaping Use: Never used  Substance and Sexual Activity  . Alcohol use: No  . Drug use: No  . Sexual activity: Not on file  Other Topics Concern  . Not on file  Social History Narrative    Married to [employer]   Financial Resource Strain: Not on file  Food Insecurity: Not on file  Transportation Needs: Not on file  Physical Activity: Not on file  Stress: Not on file  Social Connections: Not on file  Intimate Partner Violence: Not on file    Outpatient Medications Prior to Visit  Medication Sig Dispense Refill  . amoxicillin-clavulanate (AUGMENTIN) 875-125 MG tablet Take 1 tablet by mouth 2 (two) times daily. 20 tablet 0  . Ascorbic Acid (VITAMIN C) 100 MG tablet Take 100 mg by mouth daily.    Marland Kitchen azithromycin (ZITHROMAX Z-PAK) 250 MG tablet Take 2 tablets (500 mg) on  Day 1,  followed by 1 tablet (250 mg) once daily on Days 2 through 5. 6 tablet 0  . blood glucose meter kit and supplies KIT Dispense based on patient and insurance preference. Use up to four times daily as directed. (FOR ICD-9 250.00, 250.01). 1 each 0  . Cholecalciferol (VITAMIN D3) 125 MCG (5000 UT) TABS 5,000 IU OTC vitamin D3 daily. 90 tablet 3  . ELDERBERRY PO Take by mouth.    . levothyroxine (SYNTHROID) 50 MCG tablet Take 1 tablet (50 mcg total) by mouth daily before breakfast. 90 tablet 1  .  ONETOUCH ULTRA test strip TEST UP TO 4 TIMES A DAY 100 strip 11  . Zinc Sulfate (ZINC 15 PO) Take by mouth.    . levocetirizine (XYZAL) 5 MG tablet Take 1 tablet (5 mg total) by mouth every evening. For seasonal allergies (Patient not taking: No sig reported) 90 tablet 1   No facility-administered medications prior to visit.    Allergies  Allergen Reactions  . Hydrocodone-Acetaminophen Other (See Comments)    REACTION: atrial fibrilation  . Oxycodone Other (See Comments)    Atrial fibrilation  . Oxycodone-Acetaminophen Other (See Comments)    REACTION: atrial fibrilation   ok Darvocet    ROS Review of Systems A fourteen system review of systems was performed and found to be positive as per HPI.   Objective:    Physical Exam General:  Well Developed, well nourished,  in no acute distress.  Neuro:  Alert and oriented,  extra-ocular muscles intact  HEENT:  Normocephalic, atraumatic, neck supple Skin:  no gross rash, warm, pink. Cardiac:  RRR, S1 S2 Respiratory:  ECTA B/L w/o wheezing, crackles, or rales, Not using accessory muscles, speaking in full sentences- unlabored. Vascular:  Ext warm, no cyanosis apprec.; no gross edema Psych:  No HI/SI, judgement and insight good, Euthymic mood. Full Affect.  BP 131/73   Pulse 68   Ht 5' 5.5" (1.664 m)   Wt 160 lb 12.8 oz (72.9 kg)   SpO2 98%   BMI 26.35 kg/m  Wt Readings from Last 3 Encounters:  11/20/20 160 lb 12.8 oz (72.9 kg)  11/14/20 153 lb 1.6 oz (69.4 kg)  11/13/20 151 lb 3.2 oz (68.6 kg)     Health Maintenance Due  Topic Date Due  . COVID-19 Vaccine (1) Never done  . PNA vac Low Risk Adult (2 of 2 - PCV13) 06/23/2009  . TETANUS/TDAP  06/23/2018  . INFLUENZA VACCINE  Never done    There are no preventive care reminders to display for this patient.  Lab Results  Component Value Date   TSH 1.710 08/27/2020   Lab Results  Component Value Date   WBC 10.2 11/20/2020   HGB 14.0 11/20/2020   HCT 40.0 11/20/2020   MCV 89 11/20/2020   PLT 213 11/20/2020   Lab Results  Component Value Date   NA 141 11/20/2020   K 4.2 11/20/2020   CO2 27 11/20/2020   GLUCOSE 117 (H) 11/20/2020   BUN 14 11/20/2020   CREATININE 1.03 11/20/2020   BILITOT 0.7 11/20/2020   ALKPHOS 85 11/20/2020   AST 17 11/20/2020   ALT 33 11/20/2020   PROT 6.4 11/20/2020   ALBUMIN 3.9 11/20/2020   CALCIUM 11.9 (H) 11/20/2020   ANIONGAP 6 08/01/2017   GFR 95.86 07/31/2015   Lab Results  Component Value Date   CHOL 144 08/27/2020   Lab Results  Component Value Date   HDL 37 (L) 08/27/2020   Lab Results  Component Value Date   LDLCALC 87 08/27/2020   Lab Results  Component Value Date   TRIG 106 08/27/2020   Lab Results  Component Value Date   CHOLHDL 3.9 08/27/2020   Lab Results  Component Value Date    HGBA1C 7.2 (H) 08/27/2020      Assessment & Plan:   Problem List Items Addressed This Visit      Respiratory   Community acquired pneumonia, bilateral - Primary   Relevant Orders   Comp Met (CMET) (Completed)   CBC w/Diff (Completed)   DG Chest  2 View (Completed)     Other   Serum calcium elevated   Relevant Orders   Comp Met (CMET) (Completed)   Calcium, ionized (Completed)   PTH, intact (no Ca) (Completed)   Magnesium (Completed)    Other Visit Diagnoses    Heartburn         Community acquired pneumonia, bilateral: -Improving and pulmonary exam wnl.  -Complete antibiotic therapy as indicated and home supportive therapy. -Will repeat chest x-ray  Serum calcium elevated, Heartburn: -Will check additional labs such as PTH, ionized calcium, and magnesium to evaluate for possible etiologies. Discussed with patient to reduce Tums intake and recommend to try over-the-counter H2 receptor antagonist such as famotidine to improve heartburn symptoms. Recent renal function stable. Advised to avoid provocative foods.  No orders of the defined types were placed in this encounter.   Follow-up: Return if symptoms worsen or fail to improve.   Note:  This note was prepared with assistance of Dragon voice recognition software. Occasional wrong-word or sound-a-like substitutions may have occurred due to the inherent limitations of voice recognition software.   Lorrene Reid, PA-C

## 2020-11-21 ENCOUNTER — Ambulatory Visit
Admission: RE | Admit: 2020-11-21 | Discharge: 2020-11-21 | Disposition: A | Discharge status: Home / Self Care | Payer: Medicare HMO | Source: Ambulatory Visit | Attending: Physician Assistant | Admitting: Physician Assistant

## 2020-11-21 DIAGNOSIS — H401111 Primary open-angle glaucoma, right eye, mild stage: Secondary | ICD-10-CM | POA: Diagnosis not present

## 2020-11-21 DIAGNOSIS — J189 Pneumonia, unspecified organism: Secondary | ICD-10-CM

## 2020-11-21 DIAGNOSIS — J984 Other disorders of lung: Secondary | ICD-10-CM | POA: Diagnosis not present

## 2020-11-21 DIAGNOSIS — Z8701 Personal history of pneumonia (recurrent): Secondary | ICD-10-CM | POA: Diagnosis not present

## 2020-11-21 LAB — COMPREHENSIVE METABOLIC PANEL
ALT: 33 IU/L (ref 0–44)
AST: 17 IU/L (ref 0–40)
Albumin/Globulin Ratio: 1.6 (ref 1.2–2.2)
Albumin: 3.9 g/dL (ref 3.7–4.7)
Alkaline Phosphatase: 85 IU/L (ref 44–121)
BUN/Creatinine Ratio: 14 (ref 10–24)
BUN: 14 mg/dL (ref 8–27)
Bilirubin Total: 0.7 mg/dL (ref 0.0–1.2)
CO2: 27 mmol/L (ref 20–29)
Calcium: 11.9 mg/dL — ABNORMAL HIGH (ref 8.6–10.2)
Chloride: 103 mmol/L (ref 96–106)
Creatinine, Ser: 1.03 mg/dL (ref 0.76–1.27)
GFR calc Af Amer: 79 mL/min/{1.73_m2} (ref >59)
GFR calc non Af Amer: 68 mL/min/{1.73_m2} (ref >59)
Globulin, Total: 2.5 g/dL (ref 1.5–4.5)
Glucose: 117 mg/dL — ABNORMAL HIGH (ref 65–99)
Potassium: 4.2 mmol/L (ref 3.5–5.2)
Sodium: 141 mmol/L (ref 134–144)
Total Protein: 6.4 g/dL (ref 6.0–8.5)

## 2020-11-21 LAB — CBC WITH DIFFERENTIAL/PLATELET
Basophils Absolute: 0 10*3/uL (ref 0.0–0.2)
Basos: 0 %
EOS (ABSOLUTE): 0.3 10*3/uL (ref 0.0–0.4)
Eos: 3 %
Hematocrit: 40 % (ref 37.5–51.0)
Hemoglobin: 14 g/dL (ref 13.0–17.7)
Immature Grans (Abs): 0 10*3/uL (ref 0.0–0.1)
Immature Granulocytes: 0 %
Lymphocytes Absolute: 3.2 10*3/uL — ABNORMAL HIGH (ref 0.7–3.1)
Lymphs: 32 %
MCH: 31.1 pg (ref 26.6–33.0)
MCHC: 35 g/dL (ref 31.5–35.7)
MCV: 89 fL (ref 79–97)
Monocytes Absolute: 0.9 10*3/uL (ref 0.1–0.9)
Monocytes: 9 %
Neutrophils Absolute: 5.7 10*3/uL (ref 1.4–7.0)
Neutrophils: 56 %
Platelets: 213 10*3/uL (ref 150–450)
RBC: 4.5 x10E6/uL (ref 4.14–5.80)
RDW: 12.4 % (ref 11.6–15.4)
WBC: 10.2 10*3/uL (ref 3.4–10.8)

## 2020-11-21 LAB — MAGNESIUM: Magnesium: 1 mg/dL — ABNORMAL LOW (ref 1.6–2.3)

## 2020-11-21 LAB — PARATHYROID HORMONE, INTACT (NO CA): PTH: 22 pg/mL (ref 15–65)

## 2020-11-21 LAB — CALCIUM, IONIZED: Calcium, Ion: 6.3 mg/dL — ABNORMAL HIGH (ref 4.5–5.6)

## 2020-11-28 ENCOUNTER — Other Ambulatory Visit: Payer: Self-pay | Admitting: Physician Assistant

## 2020-11-28 DIAGNOSIS — R7989 Other specified abnormal findings of blood chemistry: Secondary | ICD-10-CM

## 2020-12-21 ENCOUNTER — Ambulatory Visit: Payer: Medicare HMO | Admitting: Physician Assistant

## 2020-12-28 ENCOUNTER — Other Ambulatory Visit: Payer: Self-pay | Admitting: Physician Assistant

## 2020-12-28 DIAGNOSIS — R7989 Other specified abnormal findings of blood chemistry: Secondary | ICD-10-CM

## 2020-12-28 DIAGNOSIS — E119 Type 2 diabetes mellitus without complications: Secondary | ICD-10-CM

## 2020-12-28 DIAGNOSIS — E559 Vitamin D deficiency, unspecified: Secondary | ICD-10-CM

## 2020-12-28 DIAGNOSIS — Z0000 Encounter for general adult medical examination without abnormal findings: Secondary | ICD-10-CM

## 2020-12-28 DIAGNOSIS — E1169 Type 2 diabetes mellitus with other specified complication: Secondary | ICD-10-CM

## 2020-12-28 DIAGNOSIS — E785 Hyperlipidemia, unspecified: Secondary | ICD-10-CM

## 2020-12-31 ENCOUNTER — Other Ambulatory Visit: Payer: Self-pay

## 2020-12-31 ENCOUNTER — Other Ambulatory Visit: Payer: Medicare HMO

## 2020-12-31 DIAGNOSIS — R7989 Other specified abnormal findings of blood chemistry: Secondary | ICD-10-CM

## 2020-12-31 DIAGNOSIS — E1169 Type 2 diabetes mellitus with other specified complication: Secondary | ICD-10-CM | POA: Diagnosis not present

## 2020-12-31 DIAGNOSIS — E559 Vitamin D deficiency, unspecified: Secondary | ICD-10-CM

## 2020-12-31 DIAGNOSIS — Z0000 Encounter for general adult medical examination without abnormal findings: Secondary | ICD-10-CM | POA: Diagnosis not present

## 2020-12-31 DIAGNOSIS — E119 Type 2 diabetes mellitus without complications: Secondary | ICD-10-CM | POA: Diagnosis not present

## 2020-12-31 DIAGNOSIS — E785 Hyperlipidemia, unspecified: Secondary | ICD-10-CM | POA: Diagnosis not present

## 2021-01-01 LAB — COMPREHENSIVE METABOLIC PANEL
ALT: 31 IU/L (ref 0–44)
AST: 21 IU/L (ref 0–40)
Albumin/Globulin Ratio: 1.5 (ref 1.2–2.2)
Albumin: 4 g/dL (ref 3.7–4.7)
Alkaline Phosphatase: 86 IU/L (ref 44–121)
BUN/Creatinine Ratio: 16 (ref 10–24)
BUN: 17 mg/dL (ref 8–27)
Bilirubin Total: 1.2 mg/dL (ref 0.0–1.2)
CO2: 26 mmol/L (ref 20–29)
Calcium: 11.8 mg/dL — ABNORMAL HIGH (ref 8.6–10.2)
Chloride: 102 mmol/L (ref 96–106)
Creatinine, Ser: 1.05 mg/dL (ref 0.76–1.27)
GFR calc Af Amer: 77 mL/min/{1.73_m2} (ref >59)
GFR calc non Af Amer: 67 mL/min/{1.73_m2} (ref >59)
Globulin, Total: 2.7 g/dL (ref 1.5–4.5)
Glucose: 176 mg/dL — ABNORMAL HIGH (ref 65–99)
Potassium: 4.3 mmol/L (ref 3.5–5.2)
Sodium: 141 mmol/L (ref 134–144)
Total Protein: 6.7 g/dL (ref 6.0–8.5)

## 2021-01-01 LAB — HEMOGLOBIN A1C
Est. average glucose Bld gHb Est-mCnc: 151 mg/dL
Hgb A1c MFr Bld: 6.9 % — ABNORMAL HIGH (ref 4.8–5.6)

## 2021-01-01 LAB — CBC
Hematocrit: 42.2 % (ref 37.5–51.0)
Hemoglobin: 14.3 g/dL (ref 13.0–17.7)
MCH: 30.6 pg (ref 26.6–33.0)
MCHC: 33.9 g/dL (ref 31.5–35.7)
MCV: 90 fL (ref 79–97)
Platelets: 161 10*3/uL (ref 150–450)
RBC: 4.67 x10E6/uL (ref 4.14–5.80)
RDW: 13 % (ref 11.6–15.4)
WBC: 7.4 10*3/uL (ref 3.4–10.8)

## 2021-01-01 LAB — LIPID PANEL
Chol/HDL Ratio: 3.5 ratio (ref 0.0–5.0)
Cholesterol, Total: 149 mg/dL (ref 100–199)
HDL: 42 mg/dL (ref >39)
LDL Chol Calc (NIH): 87 mg/dL (ref 0–99)
Triglycerides: 111 mg/dL (ref 0–149)
VLDL Cholesterol Cal: 20 mg/dL (ref 5–40)

## 2021-01-01 LAB — TSH: TSH: 1.84 u[IU]/mL (ref 0.450–4.500)

## 2021-01-01 LAB — VITAMIN D 25 HYDROXY (VIT D DEFICIENCY, FRACTURES): Vit D, 25-Hydroxy: 61.2 ng/mL (ref 30.0–100.0)

## 2021-01-03 ENCOUNTER — Other Ambulatory Visit: Payer: Self-pay

## 2021-01-03 ENCOUNTER — Ambulatory Visit (INDEPENDENT_AMBULATORY_CARE_PROVIDER_SITE_OTHER): Payer: Medicare HMO | Admitting: Physician Assistant

## 2021-01-03 ENCOUNTER — Encounter: Payer: Self-pay | Admitting: Physician Assistant

## 2021-01-03 VITALS — Ht 65.5 in | Wt 156.8 lb

## 2021-01-03 DIAGNOSIS — Z0000 Encounter for general adult medical examination without abnormal findings: Secondary | ICD-10-CM

## 2021-01-03 NOTE — Progress Notes (Signed)
Virtual Visit via Telephone Note:  I connected with Ronald Kelley by telephone and verified that I am speaking with the correct person using two identifiers.    I discussed the limitations, risks, security and privacy concerns for performing an evaluation and management service by telephone and the availability of in person appointments. The staff discussed with patient that there may be a patient responsible charge related to this service. The patient expressed understanding and agreed to proceed.   Location of Patient- Home Location of Provider- Office    Subjective:   Ronald Kelley is a 80 y.o. male who presents for Medicare Annual/Subsequent preventive examination.  Review of Systems    General:   No F/C, wt loss Pulm:   No DIB, SOB, pleuritic chest pain Card:  No CP, palpitations Abd:  No n/v/d or pain Ext:  No inc edema from baseline   Objective:    Today's Vitals   01/03/21 1333  Weight: 156 lb 12.8 oz (71.1 kg)  Height: 5' 5.5" (1.664 m)   Body mass index is 25.7 kg/m.  Advanced Directives 08/31/2019 08/01/2017 07/31/2017 09/24/2016  Does Patient Have a Medical Advance Directive? No No No Yes  Type of Advance Directive - - Public librarian  Does patient want to make changes to medical advance directive? - - - No - Patient declined  Copy of [place] in Chart? - - - No - copy requested  Would patient like information on creating a medical advance directive? - - Yes (ED - Information included in AVS) -    Current Medications (verified) Outpatient Encounter Medications as of 01/03/2021  Medication Sig  . amoxicillin-clavulanate (AUGMENTIN) 875-125 MG tablet Take 1 tablet by mouth 2 (two) times daily.  . Ascorbic Acid (VITAMIN C) 100 MG tablet Take 100 mg by mouth daily.  Marland Kitchen azithromycin (ZITHROMAX Z-PAK) 250 MG tablet Take 2 tablets (500 mg) on  Day 1,  followed by 1 tablet (250 mg) once daily on Days 2 through 5.  . blood glucose meter  kit and supplies KIT Dispense based on patient and insurance preference. Use up to four times daily as directed. (FOR ICD-9 250.00, 250.01).  . Cholecalciferol (VITAMIN D3) 125 MCG (5000 UT) TABS 5,000 IU OTC vitamin D3 daily.  Marland Kitchen ELDERBERRY PO Take by mouth.  . levocetirizine (XYZAL) 5 MG tablet Take 1 tablet (5 mg total) by mouth every evening. For seasonal allergies  . levothyroxine (SYNTHROID) 50 MCG tablet TAKE 1 TABLET BY MOUTH DAILY BEFORE BREAKFAST  . ONETOUCH ULTRA test strip TEST UP TO 4 TIMES A DAY  . Zinc Sulfate (ZINC 15 PO) Take by mouth.   No facility-administered encounter medications on file as of 01/03/2021.    Allergies (verified) Hydrocodone-acetaminophen, Oxycodone, and Oxycodone-acetaminophen   History: Past Medical History:  Diagnosis Date  . Atrial fib/flutter, transient   . Degenerative disk disease   . Diabetes mellitus    a1c 6.5, 07/2008  . Hyperthyroidism    remote h/o , no ablation  . OSA on CPAP    Past Surgical History:  Procedure Laterality Date  . HERNIA REPAIR  2010  . 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 ROTATOR CUFF REPAIR     Family History  Problem Relation Age of Onset  . Hypertension Mother   . Diabetes Brother   . Alzheimer's disease Father 68  . Coronary  artery disease Neg Hx   . Stroke Neg Hx   . Colon cancer Neg Hx   . Prostate cancer Neg Hx    Social History   Socioeconomic History  . Marital status: Married    Spouse name: Not on file  . Number of children: 3  . Years of education: Not on file  . Highest education level: Not on file  Occupational History  . Occupation: Technical sales engineer-- semi-retirement  Tobacco Use  . Smoking status: Never Smoker  . Smokeless tobacco: Never Used  Vaping Use  . Vaping Use: Never used  Substance and Sexual Activity  . Alcohol use: No  . Drug use: No  . Sexual activity: Not on file  Other Topics Concern  . Not on file   Social History Narrative   Married to [employer]   Financial Resource Strain: Not on file  Food Insecurity: Not on file  Transportation Needs: Not on file  Physical Activity: Not on file  Stress: Not on file  Social Connections: Not on file    Tobacco Counseling Counseling given: Not Answered   Diabetic? Yes   Activities of Daily Living In your present state of health, do you have any difficulty performing the following activities: 01/03/2021 11/20/2020  Hearing? Tempie Donning  Vision? N N  Difficulty concentrating or making decisions? N N  Walking or climbing stairs? N N  Dressing or bathing? N N  Doing errands, shopping? N N  Some recent data might be hidden    Patient Care Team: Lorrene Reid, PA-C as PCP - General Minus Breeding, MD as PCP - Cardiology (Cardiology) Clance, Armando Reichert, MD as Referring Physician (Pulmonary Disease) Kristeen Miss, MD as Consulting Physician (Neurosurgery) Milus Banister, MD as Attending Physician (Gastroenterology) Festus Aloe, MD as Consulting Physician (Urology) Delrae Rend, MD as Consulting Physician (Endocrinology) Gaynelle Arabian, MD as Consulting Physician (Orthopedic Surgery)  Indicate any recent Medical Services you may have received from other than [facility] providers in the past year (date may be approximate).     Assessment:   This is a routine wellness examination for Ronald Kelley.  Hearing/Vision screen No exam data present  Dietary issues and exercise activities discussed: -Recommend to continue to reduce/monitor carbohydrates, sweets, and saturated and trans fats. Continue to stay as active as possible.  Goals   None    Depression Screen PHQ 2/9 Scores 01/03/2021 11/20/2020 08/20/2020 04/27/2020 08/31/2019 03/23/2019 03/22/2018  PHQ - 2 Score 0 0 1 0 1 0 0  PHQ- 9 Score '3 2 4 2 2 ' 0 2    Fall Risk Fall Risk  01/03/2021 11/20/2020 08/20/2020 08/31/2019 03/23/2019  Falls in the past year? 1 0 '1 1 1   ' Comment - - - - -  Number falls in past yr: 0 - 0 0 0  Injury with Fall? 0 - 0 1 0  Risk for fall due to : - - - - -  Follow up Falls evaluation completed Falls evaluation completed Falls evaluation completed - Falls evaluation completed    [place]:  Any stairs in or around the home? Yes  If so, are there any without handrails? Yes  Home free of loose throw rugs in walkways, pet beds, electrical cords, etc? No  Adequate lighting in your home to reduce risk of falls? Yes   ASSISTIVE DEVICES UTILIZED TO PREVENT FALLS:  Life alert? No  Use of a cane,  walker or w/c? No  Grab bars in the bathroom? No  Shower chair or bench in shower? No  Elevated toilet seat or a handicapped toilet? No   TIMED UP AND GO:  Was the test performed? No .  Length of time to ambulate 10 feet: 0 sec.     Cognitive Function: wnl     6CIT Screen 01/03/2021 03/23/2019  What Year? 0 points 0 points  What month? 0 points 0 points  What time? 0 points 0 points  Count back from 20 2 points 0 points  Months in reverse 0 points 0 points  Repeat phrase 0 points 0 points  Total Score 2 0    Immunizations Immunization History  Administered Date(s) Administered  . Pneumococcal Polysaccharide-23 06/23/2008  . Td 06/23/2008    TDAP status: Due, Education has been provided regarding the importance of this vaccine. Advised may receive this vaccine at local pharmacy or Health Dept. Aware to provide a copy of the vaccination record if obtained from local pharmacy or Health Dept. Verbalized acceptance and understanding.  Flu Vaccine status: Due, Education has been provided regarding the importance of this vaccine. Advised may receive this vaccine at local pharmacy or Health Dept. Aware to provide a copy of the vaccination record if obtained from local pharmacy or Health Dept. Verbalized acceptance and understanding.  Pneumococcal vaccine status: Due, Education has been provided  regarding the importance of this vaccine. Advised may receive this vaccine at local pharmacy or Health Dept. Aware to provide a copy of the vaccination record if obtained from local pharmacy or Health Dept. Verbalized acceptance and understanding.  Covid-19 vaccine status: Declined, Education has been provided regarding the importance of this vaccine but patient still declined. Advised may receive this vaccine at local pharmacy or Health Dept.or vaccine clinic. Aware to provide a copy of the vaccination record if obtained from local pharmacy or Health Dept. Verbalized acceptance and understanding.  Qualifies for Shingles Vaccine? Yes   Zostavax completed No   Shingrix Completed?: No.    Education has been provided regarding the importance of this vaccine. Patient has been advised to call insurance company to determine out of pocket expense if they have not yet received this vaccine. Advised may also receive vaccine at local pharmacy or Health Dept. Verbalized acceptance and understanding.  Screening Tests Health Maintenance  Topic Date Due  . COVID-19 Vaccine (1) Never done  . PNA vac Low Risk Adult (2 of 2 - PCV13) 06/23/2009  . TETANUS/TDAP  06/23/2018  . INFLUENZA VACCINE  Never done  . HEMOGLOBIN A1C  06/30/2021  . FOOT EXAM  Discontinued  . OPHTHALMOLOGY EXAM  Discontinued  . URINE MICROALBUMIN  Discontinued    Health Maintenance  Health Maintenance Due  Topic Date Due  . COVID-19 Vaccine (1) Never done  . PNA vac Low Risk Adult (2 of 2 - PCV13) 06/23/2009  . TETANUS/TDAP  06/23/2018  . INFLUENZA VACCINE  Never done    Colorectal cancer screening: No longer required.   Lung Cancer Screening: (Low Dose CT Chest recommended if Age 84-80 years, 30 pack-year currently smoking OR have quit w/in 15years.) does not qualify.   Lung Cancer Screening Referral:   Additional Screening:  Hepatitis C Screening: does not qualify; Completed   Vision Screening: Recommended annual  ophthalmology exams for early detection of glaucoma and other disorders of the eye. Is the patient up to date with their annual eye exam?  Yes  Who is the provider or what  is the name of the office in which the patient attends annual eye exams?  If pt is not established with a provider, would they like to be referred to a provider to establish care? Yes .   Dental Screening: Recommended annual dental exams for proper oral hygiene  Community Resource Referral / Chronic Care Management: CRR required this visit?  No   CCM required this visit?  No      Plan:  -Continue current medication regimen. -Discussed most recent labs which are essentially within normal limits or stable from prior.  A1c has improved. -Follow-up in 4 months for DM, HLD, thyroid  I have personally reviewed and noted the following in the patient's chart:   . Medical and social history . Use of alcohol, tobacco or illicit drugs  . Current medications and supplements . Functional ability and status . Nutritional status . Physical activity . Advanced directives . List of other physicians . Hospitalizations, surgeries, and ER visits in previous 12 months . Vitals . Screenings to include cognitive, depression, and falls . Referrals and appointments  In addition, I have reviewed and discussed with patient certain preventive protocols, quality metrics, and best practice recommendations. A written personalized care plan for preventive services as well as general preventive health recommendations were provided to patient.

## 2021-02-14 ENCOUNTER — Ambulatory Visit: Payer: Medicare HMO | Admitting: Family Medicine

## 2021-02-14 NOTE — Progress Notes (Deleted)
    Subjective:    CC: R wrist pain  I, Ronald Kelley, LAT, ATC, am serving as scribe for Dr. Lynne Leader.  HPI: Pt is an 80 y/o male presenting w/ c/o R wrist pain x .  He locates his pain to .  R wrist swelling: R wrist mechanical symptoms: Aggravating factors: Treatments tried:  Pertinent review of Systems: ***  Relevant historical information: ***   Objective:   There were no vitals filed for this visit. General: Well Developed, well nourished, and in no acute distress.   MSK: ***  Lab and Radiology Results No results found for this or any previous visit (from the past 72 hour(s)). No results found.    Impression and Recommendations:    Assessment and Plan: 80 y.o. male with ***.  PDMP not reviewed this encounter. No orders of the defined types were placed in this encounter.  No orders of the defined types were placed in this encounter.   Discussed warning signs or symptoms. Please see discharge instructions. Patient expresses understanding.   ***

## 2021-02-15 ENCOUNTER — Ambulatory Visit: Payer: Medicare HMO | Admitting: Family Medicine

## 2021-02-20 NOTE — Progress Notes (Deleted)
    Subjective:    CC: R wrist pain / cyst  I, Wendy Poet, LAT, ATC, am serving as scribe for Dr. Lynne Leader.  HPI: Pt is an 80 y/o male presenting w/ c/o R wrist pain/lump x approximately one year that has been gradually increasing in size.  He locates his pain to .  R wrist swelling: Aggravating factors: Treatments tried:  Pertinent review of Systems: ***  Relevant historical information: ***   Objective:   There were no vitals filed for this visit. General: Well Developed, well nourished, and in no acute distress.   MSK: ***  Lab and Radiology Results No results found for this or any previous visit (from the past 72 hour(s)). No results found.    Impression and Recommendations:    Assessment and Plan: 80 y.o. male with ***.  PDMP not reviewed this encounter. No orders of the defined types were placed in this encounter.  No orders of the defined types were placed in this encounter.   Discussed warning signs or symptoms. Please see discharge instructions. Patient expresses understanding.   ***

## 2021-02-21 ENCOUNTER — Ambulatory Visit: Payer: Medicare HMO | Admitting: Family Medicine

## 2021-02-22 NOTE — Progress Notes (Signed)
I, Peterson Lombard, LAT, ATC acting as a scribe for Lynne Leader, MD.  Subjective:    CC: Right wrist pain  HPI: Pt is a 80 y/o male c/o R wrist pain ongoing for 6 months w/ no known MOI. Pt does recall that he fractured his arm in the 3rd grade and is unsure if this is related. Pt locates pain to the distal radius where a small nodule is located. Pt notes base of the thumb and into the hand was really hurting him last night for no particular reason.  He has similar pain in the left wrist base of thumb and into the hand as well.  Aspercreme is helpful.  Grip strength: not really Hand/fingers numbness/tingling: no Aggravates: nothing  Treatments tried: Aspercreme, aspirin  Pertinent review of Systems: No fevers or chills  Relevant historical information: Diabetes.  Atrial fibrillation.   Objective:    Vitals:   02/25/21 1413  BP: (!) 144/73  Pulse: 71  SpO2: 96%   General: Well Developed, well nourished, and in no acute distress.   MSK: Right wrist firm mass radial volar distal wrist.  Associated next to the radial artery.  Nontender. Bossing the degenerative changes present into the base of the thumb and the hand more severe at DIPs present. Normal hand motion.  Tender palpation at first [facility] and across DIPs hand.  Left hand.  Degenerative changes throughout similar to right with more significant DJD changes at first [facility] and across DIPs throughout.  Grip strength intact bilaterally.  Lab and Radiology Results  X-ray images bilateral hands obtained today personally and independently interpreted  Right hand: First [facility] DJD. Chondrocalcinosis present along MCPs. SIgnificant DJD third DIP  Left hand: CMC DJD.  Chondrocalcinosis also present on MCPs.  Moderate DJD throughout DIPs.  Await formal radiology review..  Diagn[facility] MSK Ultrasound of: Right wrist Volar mass visualized with ultrasound.  Hypoechoic structure associate with flexor tendon next to radial  artery visible.  No Doppler flow within large hypoechoic structure.  This is consistent with a volar ganglion cyst. Significant joint effusion and DJD changes present first CMC right thumb. Impression: Ganglion cyst present right volar wrist.  First [facility] DJD present.     Impression and Recommendations:    Assessment and Plan: 80 y.o. male with volar ganglion cyst right wrist.  Although this is present I do not think it is very symptomatic or at least causing the main pain in patient's hand today.  Patient has quite a bit of degenerative changes into his hand which is probably the source of pain.  He has what appears to be chondrocalcinosis that is probably the source of pain as well..  Discussed options.  Plan for trial of hand PT and Voltaren gel.  Check back in 6 weeks or so.  If not improved consider trial of colchicine other rheumatologic treatment for potential chondrocalcinosis/pseudogout.  PDMP not reviewed this encounter. Orders Placed This Encounter  Procedures  . [place] LIMITED JOINT SPACE STRUCTURES UP RIGHT(NO LINKED CHARGES)    Standing Status:   Future    Number of Occurrences:   1    Standing Expiration Date:   08/28/2021    Order Specific Question:   Reason for Exam (SYMPTOM  OR DIAGNOSIS REQUIRED)    Answer:   Right wrist pain    Order Specific Question:   Preferred imaging location?    Answer:   [place]  . DG Hand Complete Right    Standing  Status:   Future    Number of Occurrences:   1    Standing Expiration Date:   02/25/2022    Order Specific Question:   Reason for Exam (SYMPTOM  OR DIAGNOSIS REQUIRED)    Answer:   eval hand pain    Order Specific Question:   Preferred imaging location?    Answer:   [facility]  . DG Hand Complete Left    Standing Status:   Future    Number of Occurrences:   1    Standing Expiration Date:   02/25/2022    Order Specific Question:   Reason for Exam (SYMPTOM  OR DIAGNOSIS REQUIRED)    Answer:   eval  hand pain l    Order Specific Question:   Preferred imaging location?    Answer:   [facility]  . Ambulatory referral to Physical Therapy    Referral Priority:   Routine    Referral Type:   Physical Medicine    Referral Reason:   Specialty Services Required    Requested Specialty:   Physical Therapy    Number of Visits Requested:   1   No orders of the defined types were placed in this encounter.   Discussed warning signs or symptoms. Please see discharge instructions. Patient expresses understanding.   The above documentation has been reviewed and is accurate and complete Lynne Leader, M.D.

## 2021-02-25 ENCOUNTER — Ambulatory Visit (INDEPENDENT_AMBULATORY_CARE_PROVIDER_SITE_OTHER): Payer: Medicare HMO

## 2021-02-25 ENCOUNTER — Other Ambulatory Visit: Payer: Self-pay

## 2021-02-25 ENCOUNTER — Ambulatory Visit: Payer: Medicare HMO | Admitting: Family Medicine

## 2021-02-25 ENCOUNTER — Ambulatory Visit: Payer: Self-pay

## 2021-02-25 VITALS — BP 144/73 | HR 71 | Ht 65.5 in | Wt 160.0 lb

## 2021-02-25 DIAGNOSIS — M19042 Primary osteoarthritis, left hand: Secondary | ICD-10-CM | POA: Diagnosis not present

## 2021-02-25 DIAGNOSIS — M25531 Pain in right wrist: Secondary | ICD-10-CM

## 2021-02-25 DIAGNOSIS — M79641 Pain in right hand: Secondary | ICD-10-CM | POA: Diagnosis not present

## 2021-02-25 DIAGNOSIS — M79642 Pain in left hand: Secondary | ICD-10-CM | POA: Diagnosis not present

## 2021-02-25 DIAGNOSIS — G8929 Other chronic pain: Secondary | ICD-10-CM

## 2021-02-25 DIAGNOSIS — M67431 Ganglion, right wrist: Secondary | ICD-10-CM | POA: Diagnosis not present

## 2021-02-25 DIAGNOSIS — M19041 Primary osteoarthritis, right hand: Secondary | ICD-10-CM | POA: Diagnosis not present

## 2021-02-25 NOTE — Patient Instructions (Addendum)
Thank you for coming in today.  Please get an Xray today before you leave  Please use voltaren gel up to 4x daily for pain as needed.   I've referred you to Physical Therapy.  Let us know if you don't hear from them in one week.  Recheck in about 6 weeks.   Let me know if things are not going well.

## 2021-02-26 DIAGNOSIS — M67431 Ganglion, right wrist: Secondary | ICD-10-CM | POA: Insufficient documentation

## 2021-02-26 NOTE — Progress Notes (Signed)
X-ray both hands show regular arthritis.  Additionally shows evidence of pseudogout.  That could cause hand pain and swelling independent of arthritis.  This could be treated with a medicine called colchicine that could help.  You would take this as needed.  Would you like me to prescribe this?

## 2021-02-27 ENCOUNTER — Telehealth: Payer: Self-pay | Admitting: Family Medicine

## 2021-02-27 MED ORDER — COLCHICINE 0.6 MG PO TABS: 0.6000 mg | ORAL_TABLET | Freq: Every day | ORAL | 2 refills | Status: DC | PRN

## 2021-02-27 NOTE — Telephone Encounter (Signed)
Colchicine prescribed for pseudogout pain.  Take as needed daily.

## 2021-02-27 NOTE — Telephone Encounter (Signed)
-----   Message from Mare Ferrari sent at 02/27/2021 10:39 AM EDT ----- Damaris Schooner w/ pt and conveyed Dr. Clovis Riley result note. Pt verbalized understanding.   Pt would like for you to proceed w/ sending in a rx for colchicine. The pharmacy on file is correct ([facility] on [address]).

## 2021-04-08 ENCOUNTER — Ambulatory Visit: Payer: Medicare HMO | Admitting: Family Medicine

## 2021-05-01 DIAGNOSIS — H401131 Primary open-angle glaucoma, bilateral, mild stage: Secondary | ICD-10-CM | POA: Diagnosis not present

## 2021-05-03 DIAGNOSIS — R1032 Left lower quadrant pain: Secondary | ICD-10-CM | POA: Diagnosis not present

## 2021-05-22 ENCOUNTER — Ambulatory Visit: Payer: Medicare HMO | Admitting: Nurse Practitioner

## 2021-05-27 ENCOUNTER — Encounter: Payer: Self-pay | Admitting: Nurse Practitioner

## 2021-05-27 ENCOUNTER — Ambulatory Visit (INDEPENDENT_AMBULATORY_CARE_PROVIDER_SITE_OTHER): Payer: Medicare HMO | Admitting: Nurse Practitioner

## 2021-05-27 ENCOUNTER — Other Ambulatory Visit: Payer: Self-pay

## 2021-05-27 VITALS — BP 111/64 | HR 56 | Temp 98.2°F | Ht 65.0 in | Wt 155.0 lb

## 2021-05-27 DIAGNOSIS — E039 Hypothyroidism, unspecified: Secondary | ICD-10-CM | POA: Diagnosis not present

## 2021-05-27 DIAGNOSIS — E119 Type 2 diabetes mellitus without complications: Secondary | ICD-10-CM | POA: Diagnosis not present

## 2021-05-27 DIAGNOSIS — B351 Tinea unguium: Secondary | ICD-10-CM | POA: Diagnosis not present

## 2021-05-27 LAB — POCT GLYCOSYLATED HEMOGLOBIN (HGB A1C): Hemoglobin A1C: 6.7 % — AB (ref 4.0–5.6)

## 2021-05-27 NOTE — Progress Notes (Signed)
Established Patient Office Visit  Subjective:  Patient ID: Ronald Kelley, male    DOB: 1941-02-05  Age: 80 y.o. MRN: [id number]  CC:  Chief Complaint  Patient presents with   Diabetes    HPI Ronald Kelley presents for evaluation of type 2 diabetes. He manages blood sugars without medication. States that he doesn't always follow diet recommendations. Is very physically active. His HgbA1c is 6.7 today. We have discussed improving diet choices and limiting intake of sugar and carbohydrates. He does not wish to start on medications. He knows he can do better and control sugars better with diet adjustments.  He is concerned about toenail fungus on his toenails. He states that this is present on all of his toenails. It is most severe on the toenails of his big toes. Toenails are cracking and breaking. He feels like he could just peel the entire nail off. They do not hurt. Does have a small are of blistering of the inner part of the toe in between the 2nd and 3rd toes on the left foot. He states that this is much more tender when he wears dress shoes. States that he has been using a topical anti fungal medication which is over the counter. He states that he has not really noted any improvement since he started using it.  Blood pressure well controlled. He denies other concerns or complaints. He denies chest pain, chest pressure, or shortness of breath. He denies headaches or visual disturbances. He denies abdominal pain, nausea, vomiting, or changes in bowel or bladder habits.    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    ROTATOR CUFF REPAIR      Family History   Problem Relation Age of Onset   Hypertension Mother    Diabetes Brother    Alzheimer's disease Father 50   Coronary artery disease Neg Hx    Stroke Neg Hx    Colon cancer Neg Hx    Prostate cancer Neg Hx     Social History   Socioeconomic History   Marital status: Married    Spouse name: Not on file   Number of children: 3   Years of education: Not on file   Highest education level: Not on file  Occupational History   Occupation: Technical sales engineer-- semi-retirement  Tobacco Use   Smoking status: Never   Smokeless tobacco: Never  Vaping Use   Vaping Use: Never used  Substance and Sexual Activity   Alcohol use: No   Drug use: No   Sexual activity: Not on file  Other Topics Concern   Not on file  Social History Narrative   Married to [employer]       Social Determinants of Health   Financial Resource Strain: Not on file  Food Insecurity: Not on file  Transportation Needs: Not on file  Physical Activity: Not on file  Stress: Not on file  Social Connections: Not on file  Intimate Partner Violence: Not on file    Outpatient Medications Prior to Visit  Medication Sig Dispense Refill  Ascorbic Acid (VITAMIN C) 100 MG tablet Take 100 mg by mouth daily.     blood glucose meter kit and supplies KIT Dispense based on patient and insurance preference. Use up to four times daily as directed. (FOR ICD-9 250.00, 250.01). 1 each 0   Cholecalciferol (VITAMIN D3) 125 MCG (5000 UT) TABS 5,000 IU OTC vitamin D3 daily. 90 tablet 3   colchicine 0.6 MG tablet Take 1 tablet (0.6 mg total) by mouth daily as needed (psuedogout pain). 30 tablet 2   ELDERBERRY PO Take by mouth.     levocetirizine (XYZAL) 5 MG tablet Take 1 tablet (5 mg total) by mouth every evening. For seasonal allergies 90 tablet 1   levothyroxine (SYNTHROID) 50 MCG tablet TAKE 1 TABLET BY MOUTH DAILY BEFORE BREAKFAST 90 tablet 1   ONETOUCH ULTRA test strip TEST UP TO 4 TIMES A DAY 100 strip 11   Zinc Sulfate (ZINC 15 PO)  Take by mouth.     No facility-administered medications prior to visit.    Allergies  Allergen Reactions   Hydrocodone-Acetaminophen Other (See Comments)    REACTION: atrial fibrilation   Oxycodone Other (See Comments)    Atrial fibrilation   Oxycodone-Acetaminophen Other (See Comments)    REACTION: atrial fibrilation   ok Darvocet    ROS Review of Systems  Constitutional:  Negative for activity change, chills, fatigue and fever.  HENT:  Negative for congestion, postnasal drip, rhinorrhea and sinus pressure.   Eyes: Negative.   Respiratory:  Negative for cough and chest tightness.   Cardiovascular:  Negative for chest pain and palpitations.  Gastrointestinal:  Negative for diarrhea and nausea.  Endocrine: Negative for cold intolerance, heat intolerance, polydipsia and polyuria.       Controls blood sugar through diet .  Musculoskeletal:  Negative for back pain and myalgias.  Skin:  Negative for rash.       Fungal infection of the toenails of both feet. Most severe on the toenails of the big toes.  Allergic/Immunologic: Negative.   Neurological:  Negative for dizziness, weakness and headaches.  Psychiatric/Behavioral:  The patient is not nervous/anxious.      Objective:    Physical Exam Vitals and nursing note reviewed.  Constitutional:      Appearance: Normal appearance. He is well-developed.  HENT:     Head: Normocephalic and atraumatic.     Nose: Nose normal.     Mouth/Throat:     Mouth: Mucous membranes are moist.  Eyes:     Extraocular Movements: Extraocular movements intact.     Conjunctiva/sclera: Conjunctivae normal.     Pupils: Pupils are equal, round, and reactive to light.  Cardiovascular:     Rate and Rhythm: Normal rate and regular rhythm.     Pulses: Normal pulses.     Heart sounds: Normal heart sounds.  Pulmonary:     Effort: Pulmonary effort is normal.     Breath sounds: Normal breath sounds.  Abdominal:     Palpations: Abdomen is soft.   Musculoskeletal:        General: Normal range of motion.     Cervical back: Normal range of motion and neck supple.       Feet:  Feet:     Right foot:     Toenail Condition: Right toenails are abnormally thick. Fungal disease present.    Left foot:     Toenail Condition: Left toenails are abnormally thick. Fungal disease present. Lymphadenopathy:     Cervical: No cervical adenopathy.  Skin:    General: Skin is warm and dry.     Capillary Refill: Capillary refill takes less than 2 seconds.  Neurological:     General: No focal deficit present.     Mental Status: He is alert and oriented to person, place, and time.  Psychiatric:        Mood and Affect: Mood normal.        Behavior: Behavior normal.        Thought Content: Thought content normal.        Judgment: Judgment normal.    Today's Vitals   05/27/21 1204  BP: 111/64  Pulse: (!) 56  Temp: 98.2 F (36.8 C)  SpO2: 98%  Weight: 155 lb (70.3 kg)  Height: '5\' 5"'  (1.651 m)   Body mass index is 25.79 kg/m.   Wt Readings from Last 3 Encounters:  05/27/21 155 lb (70.3 kg)  02/25/21 160 lb (72.6 kg)  01/03/21 156 lb 12.8 oz (71.1 kg)     Health Maintenance Due  Topic Date Due   COVID-19 Vaccine (1) Never done   Zoster Vaccines- Shingrix (1 of 2) Never done   PNA vac Low Risk Adult (2 of 2 - PCV13) 06/23/2009   TETANUS/TDAP  06/23/2018    There are no preventive care reminders to display for this patient.  Lab Results  Component Value Date   TSH 1.840 12/31/2020   Lab Results  Component Value Date   WBC 7.4 12/31/2020   HGB 14.3 12/31/2020   HCT 42.2 12/31/2020   MCV 90 12/31/2020   PLT 161 12/31/2020   Lab Results  Component Value Date   NA 141 12/31/2020   K 4.3 12/31/2020   CO2 26 12/31/2020   GLUCOSE 176 (H) 12/31/2020   BUN 17 12/31/2020   CREATININE 1.05 12/31/2020   BILITOT 1.2 12/31/2020   ALKPHOS 86 12/31/2020   AST 21 12/31/2020   ALT 31 12/31/2020   PROT 6.7 12/31/2020   ALBUMIN 4.0  12/31/2020   CALCIUM 11.8 (H) 12/31/2020   ANIONGAP 6 08/01/2017   GFR 95.86 07/31/2015   Lab Results  Component Value Date   CHOL 149 12/31/2020   Lab Results  Component Value Date   HDL 42 12/31/2020   Lab Results  Component Value Date   LDLCALC 87 12/31/2020   Lab Results  Component Value Date   TRIG 111 12/31/2020   Lab Results  Component Value Date   CHOLHDL 3.5 12/31/2020   Lab Results  Component Value Date   HGBA1C 6.9 (H) 12/31/2020      Assessment & Plan:  1. Diet-controlled diabetes mellitus ([place]) Patient's HgbA1c is 6.7 today. Discussed limiting intake of sugar and carbohydrates in order to lower blood sugar without adding medication. Will recheck HgbA1c in three months.  - Ambulatory referral to Podiatry  2. Onychomycosis of toenail Moderate fungal infection of the toenails of both feet which is most severe on the great toes. There is cracking and breakage of the toenails present. Advised him to continue with topical fungal treatment. Refer to podiatry for further evaluation.  - Ambulatory referral to Podiatry  3. Acquired hypothyroidism Thyroid stable. Continue levothyroxine as prescribed.    Problem List Items Addressed This Visit       Endocrine   Diet-controlled diabetes mellitus ([place]) - Primary (Chronic)   Relevant Orders   Ambulatory referral to Podiatry   Other Visit Diagnoses     Onychomycosis of toenail  Relevant Orders   Ambulatory referral to Podiatry   Acquired hypothyroidism           No orders of the defined types were placed in this encounter.   Follow-up: Return in about 3 months (around 08/27/2021) for check HgbA1c.    Ronnell Freshwater, NP

## 2021-06-24 DIAGNOSIS — H1131 Conjunctival hemorrhage, right eye: Secondary | ICD-10-CM | POA: Diagnosis not present

## 2021-07-18 ENCOUNTER — Other Ambulatory Visit: Payer: Self-pay

## 2021-07-18 ENCOUNTER — Ambulatory Visit: Payer: Medicare HMO | Admitting: Podiatry

## 2021-07-18 DIAGNOSIS — B351 Tinea unguium: Secondary | ICD-10-CM | POA: Diagnosis not present

## 2021-07-18 MED ORDER — TERBINAFINE HCL 250 MG PO TABS: 250.0000 mg | ORAL_TABLET | Freq: Every day | ORAL | 0 refills | Status: AC

## 2021-07-23 NOTE — Progress Notes (Signed)
  Subjective:  Patient ID: Ronald Kelley, male    DOB: 1941/11/11,  MRN: [id number]  Chief Complaint  Patient presents with   Nail Problem      (np) bil toenail fungus/thickness    80 y.o. male presents with the above complaint. History confirmed with patient.  Toenails are with thickened yellowed and elongated  Objective:  Physical Exam: warm, good capillary refill, no trophic changes or ulcerative lesions, normal DP and PT pulses, and normal sensory exam. Left Foot: dystrophic yellowed discolored nail plates with subungual debris Right Foot: dystrophic yellowed discolored nail plates with subungual debris  Assessment:  No diagnosis found.   Plan:  Patient was evaluated and treated and all questions answered.  Discussed etiology and treatment options of onychomycosis in detail including oral topical and laser therapy.  I recommended oral therapy and the think it be good candidate for this.  90-day prescription of Lamisil sent to his pharmacy.  We will check LFTs after next visit.  Return in about 4 months (around 11/17/2021).

## 2021-08-27 ENCOUNTER — Ambulatory Visit: Payer: Medicare HMO | Admitting: Nurse Practitioner

## 2021-08-29 ENCOUNTER — Other Ambulatory Visit: Payer: Self-pay

## 2021-08-29 ENCOUNTER — Ambulatory Visit (INDEPENDENT_AMBULATORY_CARE_PROVIDER_SITE_OTHER): Payer: Medicare HMO | Admitting: Physician Assistant

## 2021-08-29 ENCOUNTER — Encounter: Payer: Self-pay | Admitting: Physician Assistant

## 2021-08-29 ENCOUNTER — Ambulatory Visit
Admission: RE | Admit: 2021-08-29 | Discharge: 2021-08-29 | Disposition: A | Discharge status: Home / Self Care | Payer: Medicare HMO | Source: Ambulatory Visit | Attending: Physician Assistant | Admitting: Physician Assistant

## 2021-08-29 VITALS — BP 122/62 | HR 59 | Temp 98.0°F | Ht 65.0 in | Wt 157.1 lb

## 2021-08-29 DIAGNOSIS — R079 Chest pain, unspecified: Secondary | ICD-10-CM

## 2021-08-29 DIAGNOSIS — E039 Hypothyroidism, unspecified: Secondary | ICD-10-CM

## 2021-08-29 DIAGNOSIS — E119 Type 2 diabetes mellitus without complications: Secondary | ICD-10-CM

## 2021-08-29 DIAGNOSIS — R071 Chest pain on breathing: Secondary | ICD-10-CM | POA: Diagnosis not present

## 2021-08-29 DIAGNOSIS — I700 Atherosclerosis of aorta: Secondary | ICD-10-CM | POA: Diagnosis not present

## 2021-08-29 NOTE — Progress Notes (Signed)
Acute Office Visit  Subjective:    Patient ID: Ronald Kelley, male    DOB: [birth year], 80 y.o.   MRN: [id number]  Chief Complaint  Patient presents with   Acute Visit   Chest Pain   Shortness of Breath    HPI Patient is in today for c/o intermittent chest pain x couple of months. Pain lasts briefly, <1 minute. States in the past had A-fib caused by medication and symptoms feel similar. Denies shortness of breath, palpitations, syncope or orthopnea. Does report a chronic cough. No left arm pain or dizziness. Is working on increasing his water intake. States does use mouthguard and sometimes will have jaw pain.  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    ROTATOR CUFF REPAIR      Family History  Problem Relation Age of Onset   Hypertension Mother    Diabetes Brother    Alzheimer's disease Father 40   Coronary artery disease Neg Hx    Stroke Neg Hx    Colon cancer Neg Hx    Prostate cancer Neg Hx     Social History   Socioeconomic History   Marital status: Married    Spouse name: Not on file   Number of children: 3   Years of education: Not on file   Highest education level: Not on file  Occupational History   Occupation: Technical sales engineer-- semi-retirement  Tobacco Use   Smoking status: Never   Smokeless tobacco: Never  Vaping Use   Vaping Use: Never used  Substance and Sexual Activity   Alcohol use: No   Drug use: No   Sexual activity: Not on file  Other Topics Concern   Not on file  Social History Narrative   Married to [employer]       Social Determinants of Health   Financial Resource Strain: Not on file  Food Insecurity:  Not on file  Transportation Needs: Not on file  Physical Activity: Not on file  Stress: Not on file  Social Connections: Not on file  Intimate Partner Violence: Not on file    Outpatient Medications Prior to Visit  Medication Sig Dispense Refill   Ascorbic Acid (VITAMIN C) 100 MG tablet Take 100 mg by mouth daily.     blood glucose meter kit and supplies KIT Dispense based on patient and insurance preference. Use up to four times daily as directed. (FOR ICD-9 250.00, 250.01). 1 each 0   Cholecalciferol (VITAMIN D3) 125 MCG (5000 UT) TABS 5,000 IU OTC vitamin D3 daily. 90 tablet 3   colchicine 0.6 MG tablet Take 1 tablet (0.6 mg total) by mouth daily as needed (psuedogout pain). 30 tablet 2   ELDERBERRY PO Take by mouth.     levocetirizine (XYZAL) 5 MG tablet Take 1 tablet (5 mg total) by mouth every evening. For seasonal allergies 90 tablet 1   levothyroxine (SYNTHROID) 50 MCG tablet TAKE 1 TABLET BY MOUTH DAILY BEFORE BREAKFAST 90 tablet 1   ONETOUCH ULTRA test strip TEST UP TO 4 TIMES A DAY 100 strip 11  terbinafine (LAMISIL) 250 MG tablet Take 1 tablet (250 mg total) by mouth daily. 90 tablet 0   Zinc Sulfate (ZINC 15 PO) Take by mouth.     No facility-administered medications prior to visit.    Allergies  Allergen Reactions   Hydrocodone-Acetaminophen Other (See Comments)    REACTION: atrial fibrilation   Oxycodone Other (See Comments)    Atrial fibrilation   Oxycodone-Acetaminophen Other (See Comments)    REACTION: atrial fibrilation   ok Darvocet    Review of Systems Review of Systems:  A fourteen system review of systems was performed and found to be positive as per HPI. Objective:    Physical Exam General:  Well Developed, well nourished, appropriate for stated age.  Neuro:  Alert and oriented,  extra-ocular muscles intact, no focal deficits  HEENT:  Normocephalic, atraumatic, neck supple, no carotid bruits appreciated  Skin:  no gross rash, warm, pink. Cardiac:   RRR, S1 S2, no murmur  Respiratory:  CTA B/L w/o wheezing, crackles or rales, Not using accessory muscles, speaking in full sentences- unlabored. Vascular:  Ext warm, no cyanosis apprec.; cap RF less 2 sec. Psych:  No HI/SI, judgement and insight good, Euthymic mood. Full Affect.  BP 122/62   Pulse (!) 59   Temp 98 F (36.7 C)   Ht '5\' 5"'  (1.651 m)   Wt 157 lb 1.6 oz (71.3 kg)   SpO2 99%   BMI 26.14 kg/m  Wt Readings from Last 3 Encounters:  08/29/21 157 lb 1.6 oz (71.3 kg)  05/27/21 155 lb (70.3 kg)  02/25/21 160 lb (72.6 kg)    Health Maintenance Due  Topic Date Due   COVID-19 Vaccine (1) Never done   Zoster Vaccines- Shingrix (1 of 2) Never done   TETANUS/TDAP  06/23/2018   INFLUENZA VACCINE  Never done    There are no preventive care reminders to display for this patient.   Lab Results  Component Value Date   TSH 2.540 08/30/2021   Lab Results  Component Value Date   WBC 8.3 08/30/2021   HGB 14.3 08/30/2021   HCT 40.7 08/30/2021   MCV 89 08/30/2021   PLT 141 (L) 08/30/2021   Lab Results  Component Value Date   NA 139 08/30/2021   K 4.3 08/30/2021   CO2 22 08/30/2021   GLUCOSE 134 (H) 08/30/2021   BUN 11 08/30/2021   CREATININE 0.87 08/30/2021   BILITOT 1.2 08/30/2021   ALKPHOS 98 08/30/2021   AST 19 08/30/2021   ALT 19 08/30/2021   PROT 6.7 08/30/2021   ALBUMIN 4.2 08/30/2021   CALCIUM 11.3 (H) 08/30/2021   ANIONGAP 6 08/01/2017   EGFR 87 08/30/2021   GFR 95.86 07/31/2015   Lab Results  Component Value Date   CHOL 149 12/31/2020   Lab Results  Component Value Date   HDL 42 12/31/2020   Lab Results  Component Value Date   LDLCALC 87 12/31/2020   Lab Results  Component Value Date   TRIG 111 12/31/2020   Lab Results  Component Value Date   CHOLHDL 3.5 12/31/2020   Lab Results  Component Value Date   HGBA1C 7.1 (H) 08/30/2021       Assessment & Plan:   Problem List Items Addressed This Visit       Endocrine    Diet-controlled diabetes mellitus ([place]) (Chronic)   Other Visit Diagnoses     Chest pain, unspecified type    -  Primary   Relevant Orders  EKG 12-Lead   Comp Met (CMET) (Completed)   CBC w/Diff (Completed)   TSH (Completed)   HgB A1c (Completed)   Acquired hypothyroidism       Relevant Orders   T4, free (Completed)      Chest pain, unspecified type: -Etiology unclear. EKG obtained: sinus bradycardia, rate 54 bpm, no acute ST-T wave changes noted. Compared to EKG from 12/22/2019 and 09/22/2019 with NSR, from 09/03/2017 with atrial flutter with variable AV block and marked ST abnormality, from 04/27/2013 with sinus bradycardia. Discussed with patient sinus bradycardia not a new finding but different from last EKG in 2021 and recommend to follow up with cardiology. No chest pain currently, will obtain labs to evaluate for electrolyte imbalance, uncontrolled thyroid disease, infectious or metabolic etiology, and await chest x-ray report. Discussed red flag s/s to monitor and aware to seek immediate medical care.   Diet controlled diabetes mellitus: -Last A1c stable at 6.9, will repeat A1c.  No orders of the defined types were placed in this encounter.    Lorrene Reid, PA-C

## 2021-08-30 ENCOUNTER — Other Ambulatory Visit: Payer: Medicare HMO

## 2021-08-30 DIAGNOSIS — R972 Elevated prostate specific antigen [PSA]: Secondary | ICD-10-CM | POA: Diagnosis not present

## 2021-08-30 DIAGNOSIS — I951 Orthostatic hypotension: Secondary | ICD-10-CM | POA: Diagnosis not present

## 2021-08-30 DIAGNOSIS — R079 Chest pain, unspecified: Secondary | ICD-10-CM | POA: Diagnosis not present

## 2021-08-30 DIAGNOSIS — E0590 Thyrotoxicosis, unspecified without thyrotoxic crisis or storm: Secondary | ICD-10-CM | POA: Diagnosis not present

## 2021-08-30 DIAGNOSIS — D72819 Decreased white blood cell count, unspecified: Secondary | ICD-10-CM | POA: Diagnosis not present

## 2021-08-30 DIAGNOSIS — I4891 Unspecified atrial fibrillation: Secondary | ICD-10-CM | POA: Diagnosis not present

## 2021-08-30 DIAGNOSIS — E039 Hypothyroidism, unspecified: Secondary | ICD-10-CM | POA: Diagnosis not present

## 2021-08-31 LAB — HEMOGLOBIN A1C
Est. average glucose Bld gHb Est-mCnc: 157 mg/dL
Hgb A1c MFr Bld: 7.1 % — ABNORMAL HIGH (ref 4.8–5.6)

## 2021-08-31 LAB — CBC WITH DIFFERENTIAL/PLATELET
Basophils Absolute: 0.1 10*3/uL (ref 0.0–0.2)
Basos: 1 %
EOS (ABSOLUTE): 0.2 10*3/uL (ref 0.0–0.4)
Eos: 2 %
Hematocrit: 40.7 % (ref 37.5–51.0)
Hemoglobin: 14.3 g/dL (ref 13.0–17.7)
Immature Grans (Abs): 0 10*3/uL (ref 0.0–0.1)
Immature Granulocytes: 0 %
Lymphocytes Absolute: 3.2 10*3/uL — ABNORMAL HIGH (ref 0.7–3.1)
Lymphs: 38 %
MCH: 31.2 pg (ref 26.6–33.0)
MCHC: 35.1 g/dL (ref 31.5–35.7)
MCV: 89 fL (ref 79–97)
Monocytes Absolute: 0.6 10*3/uL (ref 0.1–0.9)
Monocytes: 8 %
Neutrophils Absolute: 4.3 10*3/uL (ref 1.4–7.0)
Neutrophils: 51 %
Platelets: 141 10*3/uL — ABNORMAL LOW (ref 150–450)
RBC: 4.59 x10E6/uL (ref 4.14–5.80)
RDW: 12.3 % (ref 11.6–15.4)
WBC: 8.3 10*3/uL (ref 3.4–10.8)

## 2021-08-31 LAB — COMPREHENSIVE METABOLIC PANEL
ALT: 19 IU/L (ref 0–44)
AST: 19 IU/L (ref 0–40)
Albumin/Globulin Ratio: 1.7 (ref 1.2–2.2)
Albumin: 4.2 g/dL (ref 3.6–4.6)
Alkaline Phosphatase: 98 IU/L (ref 44–121)
BUN/Creatinine Ratio: 13 (ref 10–24)
BUN: 11 mg/dL (ref 8–27)
Bilirubin Total: 1.2 mg/dL (ref 0.0–1.2)
CO2: 22 mmol/L (ref 20–29)
Calcium: 11.3 mg/dL — ABNORMAL HIGH (ref 8.6–10.2)
Chloride: 101 mmol/L (ref 96–106)
Creatinine, Ser: 0.87 mg/dL (ref 0.76–1.27)
Globulin, Total: 2.5 g/dL (ref 1.5–4.5)
Glucose: 134 mg/dL — ABNORMAL HIGH (ref 70–99)
Potassium: 4.3 mmol/L (ref 3.5–5.2)
Sodium: 139 mmol/L (ref 134–144)
Total Protein: 6.7 g/dL (ref 6.0–8.5)
eGFR: 87 mL/min/{1.73_m2} (ref >59)

## 2021-08-31 LAB — TSH: TSH: 2.54 u[IU]/mL (ref 0.450–4.500)

## 2021-08-31 LAB — T4, FREE: Free T4: 1.13 ng/dL (ref 0.82–1.77)

## 2021-09-25 ENCOUNTER — Telehealth: Payer: Self-pay | Admitting: Cardiology

## 2021-09-25 NOTE — Telephone Encounter (Signed)
Pt c/o Shortness Of Breath: STAT if SOB developed within the last 24 hours or pt is noticeably SOB on the phone  1. Are you currently SOB (can you hear that pt is SOB on the phone)? no  2. How long have you been experiencing SOB? 9 months  3. Are you SOB when sitting or when up moving around? Up moving around  4. Are you currently experiencing any other symptoms? No   I gave him appt on 11/22.

## 2021-09-25 NOTE — Telephone Encounter (Signed)
Returned call to patient who states he has a funny feeling in his chest that feels like he is not able to take a deep breath. Denies fluttering or palpitations. States symptoms began "probably 9 months ago." Reports recent change in more SOB with exertion but he is able to do regular daily activities without difficulty. Reports he was told by PCP to drink 75 oz water daily and he knows he does not come close - prefers soda. Experiences dizziness with movement on occasion; no BP to report. Last seen by Dr. Percival Spanish on 1/21. I offered him a sooner appointment with Terie Purser, NP on 10/27 and patient agrees. He thanked me for the call.

## 2021-09-26 ENCOUNTER — Other Ambulatory Visit: Payer: Self-pay

## 2021-09-26 ENCOUNTER — Encounter (HOSPITAL_BASED_OUTPATIENT_CLINIC_OR_DEPARTMENT_OTHER): Payer: Self-pay | Admitting: Family

## 2021-09-26 ENCOUNTER — Ambulatory Visit (HOSPITAL_BASED_OUTPATIENT_CLINIC_OR_DEPARTMENT_OTHER): Payer: Medicare HMO | Admitting: Family

## 2021-09-26 VITALS — BP 140/82 | HR 64 | Ht 65.0 in | Wt 155.4 lb

## 2021-09-26 DIAGNOSIS — R072 Precordial pain: Secondary | ICD-10-CM

## 2021-09-26 DIAGNOSIS — R079 Chest pain, unspecified: Secondary | ICD-10-CM

## 2021-09-26 DIAGNOSIS — R0609 Other forms of dyspnea: Secondary | ICD-10-CM

## 2021-09-26 DIAGNOSIS — R002 Palpitations: Secondary | ICD-10-CM | POA: Diagnosis not present

## 2021-09-26 DIAGNOSIS — E039 Hypothyroidism, unspecified: Secondary | ICD-10-CM

## 2021-09-26 MED ORDER — METOPROLOL TARTRATE 50 MG PO TABS: ORAL_TABLET | ORAL | 0 refills | Status: DC

## 2021-09-26 NOTE — Patient Instructions (Addendum)
Medication Instructions:  Continue your current medications.   You will take one tablet of Metoprolol Tartrate 2 hours prior to your cardiac CTA.  *If you need a refill on your cardiac medications before your next appointment, please call your pharmacy*   Lab Work: Your physician recommends that you return for lab work today: [facility]  Your provider has recommended lab work. Please have this collected at [facility] [place]. The lab is open 8:00 am - 4:30 pm.  You do not need an appointment. This is in the Primary Care office on the 3rd floor, let them know you are there for blood work and they will direct you to the lab.   If you have labs (blood work) drawn today and your tests are completely normal, you will receive your results only by: [place] (if you have MyChart) OR A paper copy in the mail If you have any lab test that is abnormal or we need to change your treatment, we will call you to review the results.   Testing/Procedures: Your physician has requested that you have an echocardiogram. Echocardiography is a painless test that uses sound waves to create images of your heart. It provides your doctor with information about the size and shape of your heart and how well your heart's chambers and valves are working. This procedure takes approximately one hour. There are no restrictions for this procedure.   Your physician has requested that you have cardiac CT. Cardiac computed tomography (CT) is a painless test that uses an x-ray machine to take clear, detailed pictures of your heart. Please follow instruction sheet as given.  Follow-Up: At [facility], you and your health needs are our priority.  As part of our continuing mission to provide you with exceptional heart care, we have created designated Provider Care Teams.  These Care Teams include your primary Cardiologist (physician) and Advanced Practice Providers (APPs -  Physician Assistants and Nurse  Practitioners) who all work together to provide you with the care you need, when you need it.  We recommend signing up for the patient portal called "MyChart".  Sign up information is provided on this After Visit Summary.  MyChart is used to connect with patients for Virtual Visits (Telemedicine).  Patients are able to view lab/test results, encounter notes, upcoming appointments, etc.  Non-urgent messages can be sent to your provider as well.   To learn more about what you can do with MyChart, go to [email or web address].    Your next appointment:   2-3 month(s)  The format for your next appointment:   In Person  Provider:   You may see Minus Breeding, MD or one of the following Advanced Practice Providers on your designated Care Team:   Rosaria Ferries, PA-C Caron Presume, PA-C Jory Sims, DNP, ANP Loel Dubonnet, NP    Other Instructions    Your cardiac CT will be scheduled at one of the below locations:   [facility] [address] [place], [place] [address] [phone]  At [facility], please arrive at the [facility] main entrance (entrance A) of [facility] 30 minutes prior to test start time. You can use the FREE valet parking offered at the main entrance (encouraged to control the heart rate for the test) Proceed to the [facility] Radiology Department (first floor) to check-in and test prep.  Please follow these instructions carefully (unless otherwise directed):  Hold all erectile dysfunction medications at least 3 days (72  hrs) prior to test.  On the Night Before the Test: Be sure to Drink plenty of water. Do not consume any caffeinated/decaffeinated beverages or chocolate 12 hours prior to your test. Do not take any antihistamines 12 hours prior to your test.  On the Day of the Test: Drink plenty of water until 1 hour prior to the test. Do not eat any food 4 hours prior to the test. You may take your regular  medications prior to the test.  Take metoprolol (Lopressor) two hours prior to test.  After the Test: Drink plenty of water. After receiving IV contrast, you may experience a mild flushed feeling. This is normal. On occasion, you may experience a mild rash up to 24 hours after the test. This is not dangerous. If this occurs, you can take Benadryl 25 mg and increase your fluid intake. If you experience trouble breathing, this can be serious. If it is severe call 911 IMMEDIATELY. If it is mild, please call our office. If you take any of these medications: Glipizide/Metformin, Avandament, Glucavance, please do not take 48 hours after completing test unless otherwise instructed.  Please allow 2-4 weeks for scheduling of routine cardiac CTs. Some insurance companies require a pre-authorization which may delay scheduling of this test.   For non-scheduling related questions, please contact the cardiac imaging nurse navigator should you have any questions/concerns: Marchia Bond, Cardiac Imaging Nurse Navigator Gordy Clement, Cardiac Imaging Nurse Navigator [facility] Direct Office Dial: [phone]   For scheduling needs, including cancellations and rescheduling, please call [place], [phone]

## 2021-09-26 NOTE — Progress Notes (Signed)
Office Visit    Patient Name: Ronald Kelley Date of Encounter: 09/26/2021  PCP:  Lorrene Reid, [place]  Cardiologist:  Minus Breeding, MD  Advanced Practice Provider:  No care team member to display Electrophysiologist:  None   Chief Complaint    Ronald Kelley is a 80 y.o. male with a hx of palpitations, atrial fibrillation, DM2, hypothyroidism, OSA presents today for dyspnea  Past Medical History    Past Medical History:  Diagnosis Date   Atrial fib/flutter, transient    Degenerative disk disease    Diabetes mellitus    a1c 6.5, 07/2008   Hyperthyroidism    remote h/o , no ablation   OSA on CPAP    Past Surgical History:  Procedure Laterality Date   HERNIA REPAIR  2010   L5 acute HNP     s/p surgery 09/21/08-- still has occasional paretheisa of the left foot    ROTATOR CUFF REPAIR      Allergies  Allergies  Allergen Reactions   Hydrocodone-Acetaminophen Other (See Comments)    REACTION: atrial fibrilation   Oxycodone Other (See Comments)    Atrial fibrilation   Oxycodone-Acetaminophen Other (See Comments)    REACTION: atrial fibrilation   ok Darvocet    History of Present Illness    Ronald Kelley is a 80 y.o. male with a hx of palpitations, atrial fibrillation, DM2, hypothyroidism, OSA last seen 12/21/2019 Dr. Percival Spanish.  He has prior history of atrial fibrillation in 2010 but did not on anticoagulation at the time.  Has not had documented atrial fibrillation since that time.  He was last seen in clinic 12/23/2019 by Dr. Percival Spanish.  Previous episodes of palpitations were noted to have occurred after caffeine or surgery.  He has was not having recurrent palpitations.  He was recommended to follow-up as needed.  He was seen by his primary care 08/29/2021 noting chest pain.  EKG was sinus bradycardia 54 bpm and no acute ST/wave changes.  He contacted the office yesterday noting 73-month history of "funny feeling "in his  chest and not being able to take a deep breath as well as increasing exertional dyspnea.  He presents today for follow up. 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 Reports occasional left sided chest discomfort and left sided arm discomfort that self resolves. Occurs at rest and with activity. He notes when he is going up a hill he "probably maybe" feels like he isn't getting enough air. Tells me he has not had palpitations in some time.  No near-syncope nor syncope  EKGs/Labs/Other Studies Reviewed:   The following studies were reviewed today:  EKG:  EKG is  ordered today.  The ekg ordered today demonstrates NSR 64 bpm with no acute ST/T wave changes.   Recent Labs: 11/20/2020: Magnesium 1.0 08/30/2021: ALT 19; BUN 11; Creatinine, Ser 0.87; Hemoglobin 14.3; Platelets 141; Potassium 4.3; Sodium 139; TSH 2.540  Recent Lipid Panel    Component Value Date/Time   CHOL 149 12/31/2020 0924   TRIG 111 12/31/2020 0924   HDL 42 12/31/2020 0924   CHOLHDL 3.5 12/31/2020 0924   CHOLHDL 3.6 10/29/2016 0750   VLDL 22 10/29/2016 0750   LDLCALC 87 12/31/2020 0924     Home Medications   No outpatient medications have been marked as taking for the 09/26/21 encounter (Appointment) with Loel Dubonnet, NP.     Review of Systems  All other systems reviewed and are otherwise negative except as noted above.  Physical Exam    VS:  There were no vitals taken for this visit. , BMI There is no height or weight on file to calculate BMI.  Wt Readings from Last 3 Encounters:  08/29/21 157 lb 1.6 oz (71.3 kg)  05/27/21 155 lb (70.3 kg)  02/25/21 160 lb (72.6 kg)     GEN: Well nourished, well developed, in no acute distress. HEENT: normal. Neck: Supple, no JVD, carotid bruits, or masses. Cardiac: RRR, no murmurs, rubs, or gallops. No clubbing, cyanosis, edema.  Radials/PT 2+ and equal bilaterally.  Respiratory:  Respirations regular and unlabored, clear to  auscultation bilaterally. GI: Soft, nontender, nondistended. MS: No deformity or atrophy. Skin: Warm and dry, no rash. Neuro:  Strength and sensation are intact. Psych: Normal affect.  Assessment & Plan    Dyspnea on exertion / Chest pain in adult -no previous ischemic evaluation per my review.  EKG today NSR with no acute ST/T wave changes.  Given worsening exertional dyspnea and chest discomfort radiate to left arm with activity plan for cardiac CTA to rule out coronary disease given risk factors of diabetes.  BMP today and metoprolol titrate 50 mg 2 hours prior to cardiac CT.  Additionally plan for echocardiogram to rule out heart failure or valvular abnormalities contributory to dyspnea.    Palpitations - Previous episode of atrial fibrillation 2010 and deferred anticoagulation at that time. No recurrent atrial fibrillation noted. Reports no palpitations.  No indication for further work-up at this time.  Hypothyroidism - Continue to follow with PCP.   DM2 - Managed by diet. Continue to follow with PCP.   Disposition: Follow up in 2 month(s) with Dr. Percival Spanish or APP.  Signed, Loel Dubonnet, NP 09/26/2021, 8:29 AM [place]

## 2021-09-27 LAB — BASIC METABOLIC PANEL
BUN/Creatinine Ratio: 14 (ref 10–24)
BUN: 15 mg/dL (ref 8–27)
CO2: 24 mmol/L (ref 20–29)
Calcium: 11.7 mg/dL — ABNORMAL HIGH (ref 8.6–10.2)
Chloride: 104 mmol/L (ref 96–106)
Creatinine, Ser: 1.09 mg/dL (ref 0.76–1.27)
Glucose: 151 mg/dL — ABNORMAL HIGH (ref 70–99)
Potassium: 4.2 mmol/L (ref 3.5–5.2)
Sodium: 141 mmol/L (ref 134–144)
eGFR: 68 mL/min/{1.73_m2} (ref >59)

## 2021-10-02 ENCOUNTER — Other Ambulatory Visit: Payer: Self-pay

## 2021-10-02 DIAGNOSIS — R7989 Other specified abnormal findings of blood chemistry: Secondary | ICD-10-CM

## 2021-10-02 MED ORDER — LEVOTHYROXINE SODIUM 50 MCG PO TABS
50.0000 ug | ORAL_TABLET | Freq: Every day | ORAL | 0 refills | Status: DC
Start: 2021-10-02 — End: 2022-01-13

## 2021-10-03 ENCOUNTER — Telehealth (HOSPITAL_COMMUNITY): Payer: Self-pay | Admitting: *Deleted

## 2021-10-03 ENCOUNTER — Other Ambulatory Visit (HOSPITAL_BASED_OUTPATIENT_CLINIC_OR_DEPARTMENT_OTHER): Payer: Medicare HMO

## 2021-10-03 NOTE — Telephone Encounter (Signed)
Reaching out to patient to offer assistance regarding upcoming cardiac imaging study; pt verbalizes understanding of appt date/time, parking situation and where to check in, pre-test NPO status and medications ordered, and verified current allergies; name and call back number provided for further questions should they arise  Gordy Clement RN Navigator Cardiac Imaging [facility] [phone] office [phone] cell  Patient to take 50mg  metoprolol tartrate two hours prior to cardiac CT scan.

## 2021-10-04 ENCOUNTER — Ambulatory Visit (HOSPITAL_COMMUNITY)
Admission: RE | Admit: 2021-10-04 | Discharge: 2021-10-04 | Disposition: A | Discharge status: Home / Self Care | Payer: Medicare HMO | Source: Ambulatory Visit | Attending: Family | Admitting: Family

## 2021-10-04 ENCOUNTER — Other Ambulatory Visit: Payer: Self-pay

## 2021-10-04 ENCOUNTER — Ambulatory Visit (INDEPENDENT_AMBULATORY_CARE_PROVIDER_SITE_OTHER): Payer: Medicare HMO

## 2021-10-04 ENCOUNTER — Encounter (HOSPITAL_COMMUNITY): Payer: Self-pay

## 2021-10-04 DIAGNOSIS — R0609 Other forms of dyspnea: Secondary | ICD-10-CM

## 2021-10-04 DIAGNOSIS — R072 Precordial pain: Secondary | ICD-10-CM | POA: Diagnosis not present

## 2021-10-04 DIAGNOSIS — R079 Chest pain, unspecified: Secondary | ICD-10-CM | POA: Diagnosis not present

## 2021-10-04 LAB — ECHOCARDIOGRAM COMPLETE
Area-P 1/2: 2.21 cm2
P 1/2 time: 793 msec
S' Lateral: 2.63 cm

## 2021-10-04 MED ORDER — NITROGLYCERIN 0.4 MG SL SUBL
0.8000 mg | SUBLINGUAL_TABLET | Freq: Once | SUBLINGUAL | Status: AC
  Administered 2021-10-04: 0.8 mg via SUBLINGUAL

## 2021-10-04 MED ORDER — IOHEXOL 350 MG/ML SOLN
100.0000 mL | Freq: Once | INTRAVENOUS | Status: AC | PRN
  Administered 2021-10-04: 100 mL via INTRAVENOUS

## 2021-10-04 MED ORDER — NITROGLYCERIN 0.4 MG SL SUBL
SUBLINGUAL_TABLET | SUBLINGUAL | Status: AC
  Filled 2021-10-04: qty 2

## 2021-10-07 ENCOUNTER — Encounter (HOSPITAL_BASED_OUTPATIENT_CLINIC_OR_DEPARTMENT_OTHER): Payer: Self-pay | Admitting: General Practice

## 2021-10-10 ENCOUNTER — Ambulatory Visit (HOSPITAL_COMMUNITY): Payer: Medicare HMO

## 2021-10-11 DIAGNOSIS — N3001 Acute cystitis with hematuria: Secondary | ICD-10-CM | POA: Diagnosis not present

## 2021-10-22 ENCOUNTER — Ambulatory Visit: Payer: Medicare HMO | Admitting: General Practice

## 2021-10-29 DIAGNOSIS — H401131 Primary open-angle glaucoma, bilateral, mild stage: Secondary | ICD-10-CM | POA: Diagnosis not present

## 2021-11-07 ENCOUNTER — Ambulatory Visit: Payer: Medicare HMO | Admitting: Podiatry

## 2021-12-03 ENCOUNTER — Ambulatory Visit (HOSPITAL_BASED_OUTPATIENT_CLINIC_OR_DEPARTMENT_OTHER): Payer: Medicare HMO | Admitting: Family

## 2021-12-03 DIAGNOSIS — H401131 Primary open-angle glaucoma, bilateral, mild stage: Secondary | ICD-10-CM | POA: Diagnosis not present

## 2022-01-11 ENCOUNTER — Other Ambulatory Visit: Payer: Self-pay | Admitting: Physician Assistant

## 2022-01-11 DIAGNOSIS — R7989 Other specified abnormal findings of blood chemistry: Secondary | ICD-10-CM

## 2022-01-17 ENCOUNTER — Ambulatory Visit (INDEPENDENT_AMBULATORY_CARE_PROVIDER_SITE_OTHER): Payer: Medicare HMO | Admitting: Physician Assistant

## 2022-01-17 ENCOUNTER — Other Ambulatory Visit: Payer: Self-pay

## 2022-01-17 ENCOUNTER — Encounter: Payer: Self-pay | Admitting: Physician Assistant

## 2022-01-17 VITALS — BP 112/82 | HR 64 | Ht 65.0 in | Wt 151.0 lb

## 2022-01-17 DIAGNOSIS — Z91199 Patient's noncompliance with other medical treatment and regimen due to unspecified reason: Secondary | ICD-10-CM | POA: Diagnosis not present

## 2022-01-17 DIAGNOSIS — E119 Type 2 diabetes mellitus without complications: Secondary | ICD-10-CM | POA: Diagnosis not present

## 2022-01-17 LAB — POCT GLYCOSYLATED HEMOGLOBIN (HGB A1C): Hemoglobin A1C: 6.9 % — AB (ref 4.0–5.6)

## 2022-01-17 NOTE — Progress Notes (Signed)
Patient requested A1c to be checked and did not stay for an office visit. Was advised needs to schedule follow-up visit for chronic conditions especially DM. Pt has a hx of being non-compliant. MA, PA-C

## 2022-02-13 ENCOUNTER — Telehealth: Payer: Self-pay | Admitting: Physician Assistant

## 2022-02-13 NOTE — Telephone Encounter (Signed)
Patient contacted office stating he had hernia repair 15 years ago and is having bilateral groin and abdominal pain. Patient states he is concerned the mesh that was placed has disintegrated.  ?Advised patient per PCP that he should go to ED for evaluation and treatment. Patient declined and states he only wants a referral to [facility]. Provider is agreeable to the referral. Referral has been placed. AS, CMA ?

## 2022-04-03 DIAGNOSIS — R1032 Left lower quadrant pain: Secondary | ICD-10-CM | POA: Diagnosis not present

## 2022-04-03 DIAGNOSIS — Z8719 Personal history of other diseases of the digestive system: Secondary | ICD-10-CM | POA: Diagnosis not present

## 2022-04-03 DIAGNOSIS — Z9889 Other specified postprocedural states: Secondary | ICD-10-CM | POA: Diagnosis not present

## 2022-04-12 ENCOUNTER — Other Ambulatory Visit: Payer: Self-pay | Admitting: Physician Assistant

## 2022-04-12 DIAGNOSIS — R7989 Other specified abnormal findings of blood chemistry: Secondary | ICD-10-CM

## 2022-06-23 DIAGNOSIS — M228X1 Other disorders of patella, right knee: Secondary | ICD-10-CM | POA: Diagnosis not present

## 2022-06-23 DIAGNOSIS — M25561 Pain in right knee: Secondary | ICD-10-CM | POA: Diagnosis not present

## 2022-07-04 DIAGNOSIS — S82024D Nondisplaced longitudinal fracture of right patella, subsequent encounter for closed fracture with routine healing: Secondary | ICD-10-CM | POA: Diagnosis not present

## 2022-07-10 ENCOUNTER — Other Ambulatory Visit: Payer: Self-pay | Admitting: Physician Assistant

## 2022-07-10 DIAGNOSIS — R7989 Other specified abnormal findings of blood chemistry: Secondary | ICD-10-CM

## 2022-07-18 DIAGNOSIS — H26493 Other secondary cataract, bilateral: Secondary | ICD-10-CM | POA: Diagnosis not present

## 2022-07-18 DIAGNOSIS — H401131 Primary open-angle glaucoma, bilateral, mild stage: Secondary | ICD-10-CM | POA: Diagnosis not present

## 2022-07-23 DIAGNOSIS — H26491 Other secondary cataract, right eye: Secondary | ICD-10-CM | POA: Diagnosis not present

## 2022-08-05 DIAGNOSIS — H26491 Other secondary cataract, right eye: Secondary | ICD-10-CM | POA: Diagnosis not present

## 2022-08-15 DIAGNOSIS — S82024D Nondisplaced longitudinal fracture of right patella, subsequent encounter for closed fracture with routine healing: Secondary | ICD-10-CM | POA: Diagnosis not present

## 2022-08-18 ENCOUNTER — Encounter: Payer: Self-pay | Admitting: Physician Assistant

## 2022-08-18 ENCOUNTER — Ambulatory Visit (INDEPENDENT_AMBULATORY_CARE_PROVIDER_SITE_OTHER): Payer: Medicare HMO | Admitting: Physician Assistant

## 2022-08-18 VITALS — BP 117/70 | HR 74 | Temp 98.0°F | Wt 151.0 lb

## 2022-08-18 DIAGNOSIS — E119 Type 2 diabetes mellitus without complications: Secondary | ICD-10-CM

## 2022-08-18 DIAGNOSIS — R350 Frequency of micturition: Secondary | ICD-10-CM | POA: Diagnosis not present

## 2022-08-18 DIAGNOSIS — R300 Dysuria: Secondary | ICD-10-CM

## 2022-08-18 DIAGNOSIS — R410 Disorientation, unspecified: Secondary | ICD-10-CM | POA: Diagnosis not present

## 2022-08-18 LAB — POCT URINALYSIS DIPSTICK
Bilirubin, UA: NEGATIVE
Blood, UA: NEGATIVE
Glucose, UA: NEGATIVE
Ketones, UA: NEGATIVE
Leukocytes, UA: NEGATIVE
Nitrite, UA: NEGATIVE
Protein, UA: NEGATIVE
Spec Grav, UA: >=1.03 — AB (ref 1.010–1.025)
Urobilinogen, UA: 0.2 E.U./dL
pH, UA: 5.5 (ref 5.0–8.0)

## 2022-08-18 NOTE — Progress Notes (Signed)
Established patient acute visit   Patient: Ronald Kelley   DOB: 11-03-1941   81 y.o. Male  MRN: [id number] Visit Date: 08/18/2022  Chief Complaint  Patient presents with   Follow-up   Subjective    HPI  Patient presents for c/o urinary frequency especially at night, burning with urination and episodes of confusion. No fever, chills, night sweats or abdominal pain. Does report sometimes has left groin pain. Does have hx of bilateral inguinal hernia repair. States recently fell going upstairs and missed a step, hurt his right knee cap which he fractured, being followed by [facility]. Patient's wife reports confusion started around when he recently broke his knee cap, pt tends to repeat his self a lot and is more forgetful.     Medications: Outpatient Medications Prior to Visit  Medication Sig   Ascorbic Acid (VITAMIN C) 100 MG tablet Take 100 mg by mouth daily.   blood glucose meter kit and supplies KIT Dispense based on patient and insurance preference. Use up to four times daily as directed. (FOR ICD-9 250.00, 250.01).   Cholecalciferol (VITAMIN D3) 125 MCG (5000 UT) TABS 5,000 IU OTC vitamin D3 daily.   ELDERBERRY PO Take by mouth.   levocetirizine (XYZAL) 5 MG tablet Take 1 tablet (5 mg total) by mouth every evening. For seasonal allergies   levothyroxine (SYNTHROID) 50 MCG tablet TAKE 1 TABLET BY MOUTH EVERY DAY BEFORE BREAKFAST   metoprolol tartrate (LOPRESSOR) 50 MG tablet Take one tablet two hours prior to cardiac CTA.   ONETOUCH ULTRA test strip TEST UP TO 4 TIMES A DAY   Zinc Sulfate (ZINC 15 PO) Take by mouth.   colchicine 0.6 MG tablet Take 1 tablet (0.6 mg total) by mouth daily as needed (psuedogout pain). (Patient not taking: Reported on 09/26/2021)   No facility-administered medications prior to visit.    Review of Systems Review of Systems:  A fourteen system review of systems was performed and found to be positive as per HPI.  Last CBC Lab Results  Component  Value Date   WBC 8.3 08/30/2021   HGB 14.3 08/30/2021   HCT 40.7 08/30/2021   MCV 89 08/30/2021   MCH 31.2 08/30/2021   RDW 12.3 08/30/2021   PLT 141 (L) [birth year]/1937   Last metabolic panel Lab Results  Component Value Date   GLUCOSE 151 (H) 09/26/2021   NA 141 09/26/2021   K 4.2 09/26/2021   CL 104 09/26/2021   CO2 24 09/26/2021   BUN 15 09/26/2021   CREATININE 1.09 09/26/2021   EGFR 68 09/26/2021   CALCIUM 11.7 (H) 09/26/2021   PHOS 1.9 (L) 07/31/2017   PROT 6.7 08/30/2021   ALBUMIN 4.2 08/30/2021   LABGLOB 2.5 08/30/2021   AGRATIO 1.7 08/30/2021   BILITOT 1.2 08/30/2021   ALKPHOS 98 08/30/2021   AST 19 08/30/2021   ALT 19 08/30/2021   ANIONGAP 6 08/01/2017   Last lipids Lab Results  Component Value Date   CHOL 149 12/31/2020   HDL 42 12/31/2020   LDLCALC 87 12/31/2020   TRIG 111 12/31/2020   CHOLHDL 3.5 12/31/2020   Last hemoglobin A1c Lab Results  Component Value Date   HGBA1C 6.9 (A) 01/17/2022   Last thyroid functions Lab Results  Component Value Date   TSH 2.540 08/30/2021       Objective    BP 117/70   Pulse 74   Temp 98 F (36.7 C) (Temporal)   Wt 151 lb (68.5 kg)   SpO2  97%   BMI 25.13 kg/m    Physical Exam  General:  Cooperative, in no acute distress, appropriate for stated age.  Neuro:  Alert and oriented,  extra-ocular muscles intact  HEENT:  Normocephalic, atraumatic, neck supple  Abdomen: non-distended, no tenderness, no CVA tenderness, +BS Skin:  no gross rash, warm, pink. Cardiac:  RRR, S1 S2 Respiratory: CTA B/L  Vascular:  Ext warm, no cyanosis apprec.; cap RF less 2 sec. Psych:  No HI/SI, judgement and insight good, Euthymic mood. Full Affect.   Results for orders placed or performed in visit on 08/18/22  POCT Urinalysis Dipstick  Result Value Ref Range   Color, UA     Clarity, UA     Glucose, UA Negative Negative   Bilirubin, UA negative    Ketones, UA negative    Spec Grav, UA >=1.030 (A) 1.010 - 1.025   Blood,  UA negative    pH, UA 5.5 5.0 - 8.0   Protein, UA Negative Negative   Urobilinogen, UA 0.2 0.2 or 1.0 E.U./dL   Nitrite, UA negative    Leukocytes, UA Negative Negative   Appearance     Odor      Assessment & Plan     Etiology of symptoms unclear. UA collected which is essentially unremarkable with the exception of specific gravity >1.030 which is likely secondary to dehydration. Will send for urine culture to r/o UTI. Patient will return tomorrow for labs in the morning to evaluate for endocrine, metabolic or nutritional deficiency contributing to symptoms. Will place referral to Urology for further evaluation of urinary symptoms. Advised patient and wife will await lab results before proceeding with referral to neurology. Pt with hx of T2DM and is medically non-compliant so will also collect A1c to evaluate for uncontrolled diabetes contributing to symptoms    Return for lab visit tomorrow; medicare wellness in 2-3 months.        Lorrene Reid, PA-C  [facility] [phone] (phone) [phone] (fax)  [place]

## 2022-08-19 ENCOUNTER — Other Ambulatory Visit: Payer: Medicare HMO

## 2022-08-19 DIAGNOSIS — R410 Disorientation, unspecified: Secondary | ICD-10-CM | POA: Diagnosis not present

## 2022-08-19 DIAGNOSIS — E119 Type 2 diabetes mellitus without complications: Secondary | ICD-10-CM | POA: Diagnosis not present

## 2022-08-19 DIAGNOSIS — R300 Dysuria: Secondary | ICD-10-CM | POA: Diagnosis not present

## 2022-08-19 DIAGNOSIS — R350 Frequency of micturition: Secondary | ICD-10-CM | POA: Diagnosis not present

## 2022-08-20 LAB — URINE CULTURE

## 2022-08-21 ENCOUNTER — Telehealth: Payer: Self-pay

## 2022-08-21 DIAGNOSIS — R300 Dysuria: Secondary | ICD-10-CM

## 2022-08-21 DIAGNOSIS — R350 Frequency of micturition: Secondary | ICD-10-CM

## 2022-08-21 NOTE — Telephone Encounter (Signed)
Patient called concerning his referral sent to [facility], patient would like to see if he could get referral sent to [facility] [place] [facility] and see Festus Aloe being that patient has seen him before.

## 2022-08-25 NOTE — Telephone Encounter (Signed)
Called patient to notify him of referral to [facility] has been faxed. Patient verbalized understanding.

## 2022-08-27 ENCOUNTER — Other Ambulatory Visit: Payer: Self-pay | Admitting: Physician Assistant

## 2022-08-27 DIAGNOSIS — D649 Anemia, unspecified: Secondary | ICD-10-CM

## 2022-08-27 DIAGNOSIS — R410 Disorientation, unspecified: Secondary | ICD-10-CM

## 2022-08-27 LAB — COMPREHENSIVE METABOLIC PANEL
ALT: 20 IU/L (ref 0–44)
AST: 18 IU/L (ref 0–40)
Albumin/Globulin Ratio: 1.7 (ref 1.2–2.2)
Albumin: 4.3 g/dL (ref 3.7–4.7)
Alkaline Phosphatase: 102 IU/L (ref 44–121)
BUN/Creatinine Ratio: 14 (ref 10–24)
BUN: 15 mg/dL (ref 8–27)
Bilirubin Total: 1.2 mg/dL (ref 0.0–1.2)
CO2: 27 mmol/L (ref 20–29)
Calcium: 12.2 mg/dL — ABNORMAL HIGH (ref 8.6–10.2)
Chloride: 101 mmol/L (ref 96–106)
Creatinine, Ser: 1.11 mg/dL (ref 0.76–1.27)
Globulin, Total: 2.6 g/dL (ref 1.5–4.5)
Glucose: 170 mg/dL — ABNORMAL HIGH (ref 70–99)
Potassium: 4.2 mmol/L (ref 3.5–5.2)
Sodium: 143 mmol/L (ref 134–144)
Total Protein: 6.9 g/dL (ref 6.0–8.5)
eGFR: 66 mL/min/{1.73_m2} (ref >59)

## 2022-08-27 LAB — FOLATE: Folate: 9.1 ng/mL (ref >3.0)

## 2022-08-27 LAB — CBC
Hematocrit: 39.9 % (ref 37.5–51.0)
Hemoglobin: 12.5 g/dL — ABNORMAL LOW (ref 13.0–17.7)
MCH: 28.7 pg (ref 26.6–33.0)
MCHC: 31.3 g/dL — ABNORMAL LOW (ref 31.5–35.7)
MCV: 92 fL (ref 79–97)
Platelets: 142 10*3/uL — ABNORMAL LOW (ref 150–450)
RBC: 4.36 x10E6/uL (ref 4.14–5.80)
RDW: 12.3 % (ref 11.6–15.4)
WBC: 7.9 10*3/uL (ref 3.4–10.8)

## 2022-08-27 LAB — TSH: TSH: 1.75 u[IU]/mL (ref 0.450–4.500)

## 2022-08-27 LAB — HEMOGLOBIN A1C
Est. average glucose Bld gHb Est-mCnc: 171 mg/dL
Hgb A1c MFr Bld: 7.6 % — ABNORMAL HIGH (ref 4.8–5.6)

## 2022-08-27 LAB — PSA: Prostate Specific Ag, Serum: 3.4 ng/mL (ref 0.0–4.0)

## 2022-08-27 LAB — RPR: RPR Ser Ql: NONREACTIVE

## 2022-08-27 LAB — VITAMIN B12: Vitamin B-12: >2000 pg/mL — ABNORMAL HIGH (ref 232–1245)

## 2022-08-27 LAB — SEDIMENTATION RATE: Sed Rate: 4 mm/hr (ref 0–30)

## 2022-08-28 ENCOUNTER — Other Ambulatory Visit: Payer: Medicare HMO

## 2022-08-29 ENCOUNTER — Other Ambulatory Visit: Payer: Medicare HMO

## 2022-09-01 ENCOUNTER — Other Ambulatory Visit: Payer: Medicare HMO

## 2022-09-01 ENCOUNTER — Other Ambulatory Visit: Payer: Self-pay | Admitting: Physician Assistant

## 2022-09-02 LAB — MAGNESIUM: Magnesium: 1.2 mg/dL — ABNORMAL LOW (ref 1.6–2.3)

## 2022-09-02 LAB — PARATHYROID HORMONE, INTACT (NO CA): PTH: 82 pg/mL — ABNORMAL HIGH (ref 15–65)

## 2022-09-02 LAB — IRON,TIBC AND FERRITIN PANEL
Ferritin: 313 ng/mL (ref 30–400)
Iron Saturation: 45 % (ref 15–55)
Iron: 127 ug/dL (ref 38–169)
Total Iron Binding Capacity: 285 ug/dL (ref 250–450)
UIBC: 158 ug/dL (ref 111–343)

## 2022-09-02 LAB — CALCIUM, IONIZED: Calcium, Ion: 7 mg/dL (ref 4.5–5.6)

## 2022-09-02 LAB — VITAMIN D 25 HYDROXY (VIT D DEFICIENCY, FRACTURES): Vit D, 25-Hydroxy: 66.7 ng/mL (ref 30.0–100.0)

## 2022-09-04 ENCOUNTER — Other Ambulatory Visit: Payer: Self-pay | Admitting: Physician Assistant

## 2022-09-04 DIAGNOSIS — E213 Hyperparathyroidism, unspecified: Secondary | ICD-10-CM

## 2022-10-02 ENCOUNTER — Ambulatory Visit: Payer: Medicare HMO | Admitting: Nurse Practitioner

## 2022-10-06 ENCOUNTER — Encounter: Payer: Self-pay | Admitting: Nurse Practitioner

## 2022-10-06 ENCOUNTER — Ambulatory Visit: Payer: Medicare HMO | Admitting: Nurse Practitioner

## 2022-10-06 NOTE — Progress Notes (Signed)
Endocrinology Consult Note       10/06/2022, 11:02 AM   SUBJECTIVE:  Ronald Kelley is a 81 y.o.-year-old male, referred by his  Lorrene Reid, PA-C  , for evaluation for hypercalcemia/hyperparathyroidism.   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    ROTATOR CUFF REPAIR      Social History   Tobacco Use   Smoking status: Never   Smokeless tobacco: Never  Vaping Use   Vaping Use: Never used  Substance Use Topics   Alcohol use: No   Drug use: No    Family History  Problem Relation Age of Onset   Hypertension Mother    Diabetes Brother    Alzheimer's disease Father 5   Coronary artery disease Neg Hx    Stroke Neg Hx    Colon cancer Neg Hx    Prostate cancer Neg Hx     Outpatient Encounter Medications as of 10/06/2022  Medication Sig   Ascorbic Acid (VITAMIN C) 100 MG tablet Take 100 mg by mouth daily.   Cholecalciferol (VITAMIN D3) 125 MCG (5000 UT) TABS 5,000 IU OTC vitamin D3 daily.   ELDERBERRY PO Take by mouth.   levothyroxine (SYNTHROID) 50 MCG tablet TAKE 1 TABLET BY MOUTH EVERY DAY BEFORE BREAKFAST   timolol (TIMOPTIC) 0.5 % ophthalmic solution Place 1 drop into both eyes daily.   Zinc Sulfate (ZINC 15 PO) Take by mouth.   [DISCONTINUED] blood glucose meter kit and supplies KIT Dispense based on patient and insurance preference. Use up to four times daily as directed. (FOR ICD-9 250.00, 250.01). (Patient not taking: Reported on 10/06/2022)   [DISCONTINUED] colchicine 0.6 MG tablet Take 1 tablet (0.6 mg total) by mouth daily as needed (psuedogout pain). (Patient not taking:  Reported on 09/26/2021)   [DISCONTINUED] levocetirizine (XYZAL) 5 MG tablet Take 1 tablet (5 mg total) by mouth every evening. For seasonal allergies (Patient not taking: Reported on 10/06/2022)   [DISCONTINUED] metoprolol tartrate (LOPRESSOR) 50 MG tablet Take one tablet two hours prior to cardiac CTA. (Patient not taking: Reported on 10/06/2022)   [DISCONTINUED] ONETOUCH ULTRA test strip TEST UP TO 4 TIMES A DAY (Patient not taking: Reported on 10/06/2022)   No facility-administered encounter medications on file as of 10/06/2022.    Allergies  Allergen Reactions   Hydrocodone-Acetaminophen Other (See Comments)    REACTION: atrial fibrilation   Oxycodone Other (See Comments)    Atrial fibrilation   Oxycodone-Acetaminophen Other (See Comments)    REACTION: atrial fibrilation   ok Darvocet     HPI  Ronald Kelley was diagnosed with hypercalcemia recently but labs show mild hypercalcemia ranging back to 2018.  Patient has no previously known history of parathyroid, pituitary, adrenal dysfunctions; no  family history of such dysfunctions. -Review of his referral package of most recent labs reveals calcium of 12.2 the corresponding PTH of 82 on 09/01/22.  Magnesium level was 1.2 on 09/01/22 and Vitamin D level was 66.7 (he does take a daily supplement).  He did suffer a fall back in September and fractured his patella.  He also notes a history kidney stones in the past as well as Osteopenia (no Dexa scan available to review).  No history of CKD. Last BUN/Cr: 15/1.11  he is not on HCTZ or other thiazide therapy.  Does have history of vitamin D deficiency, currently taking Vitamin D3 daily.  he is not on calcium supplements,  he eats dairy and green, leafy, vegetables on average amounts.  He does note that he was taking Tums quite often prior to his last set of labs for heart burn.  he does not have a family history of hypercalcemia, pituitary tumors, thyroid cancer, or osteoporosis.     ROS:  Constitutional: + weight loss, + fatigue, no subjective hyperthermia, no subjective hypothermia Eyes: no blurry vision, no xerophthalmia ENT: no sore throat, no nodules palpated in throat, no dysphagia/odynophagia, no hoarseness Cardiovascular: no Chest Pain, no Shortness of Breath, no palpitations (does have history of Afib), no leg swelling Respiratory: no cough, no shortness of breath  Gastrointestinal: no Nausea/Vomiting/Diarrhea, intermittent abdominal pain Musculoskeletal: no muscle/joint aches Skin: no rashes Neurological: no tremors, no numbness, no tingling, no dizziness Psychiatric: no depression, no anxiety, wife reports intermittent confusion   -------------------------------------------------------------------------------------------------------------------- OBJECTIVE:  BP 131/68 (BP Location: Left Arm, Patient Position: Sitting, Cuff Size: Normal)   Pulse (!) 57   Ht _0  (1.651 m)   Wt 146 lb 6.4 oz (66.4 kg)   BMI 24.36 kg/m , Body mass index is 24.36 kg/m.  Wt Readings from Last 3 Encounters:  10/06/22 146 lb 6.4 oz (66.4 kg)  08/18/22 151 lb (68.5 kg)  01/17/22 151 lb (68.5 kg)    BP Readings from Last 3 Encounters:  10/06/22 131/68  08/18/22 117/70  01/17/22 112/82    Constitutional: normal weight for height, not in acute distress, normal state of mind, mildly distracted Eyes: PERRLA, EOMI, no exophthalmos ENT: moist mucous membranes, no gross thyromegaly, no gross cervical lymphadenopathy Cardiovascular: normal precordial activity, Regular Rate and Rhythm, no Murmur/Rubs/Gallops Respiratory:  adequate breathing efforts, no gross chest deformity, Clear to auscultation bilaterally Musculoskeletal: no gross deformities, strength intact in all four extremities Skin: moist, warm, no rashes Neurological: no tremor with outstretched hands, Deep tendon reflexes normal in bilateral lower extremities.     CMP ( most recent) CMP     Component  Value Date/Time   NA 143 08/19/2022 1018   K 4.2 08/19/2022 1018   CL 101 08/19/2022 1018   CO2 27 08/19/2022 1018   GLUCOSE 170 (H) 08/19/2022 1018   GLUCOSE 121 (H) 08/01/2017 0244   BUN 15 08/19/2022 1018   CREATININE 1.11 08/19/2022 1018   CREATININE 1.09 10/29/2016 0750   CALCIUM 12.2 (H) 08/19/2022 1018   PROT 6.9 08/19/2022 1018   ALBUMIN 4.3 08/19/2022 1018   AST 18 08/19/2022 1018   ALT 20 08/19/2022 1018   ALKPHOS 102 08/19/2022 1018   BILITOT 1.2 08/19/2022 1018   GFRNONAA 67 12/31/2020 0924   GFRNONAA 66 10/29/2016 0750   GFRAA 77 12/31/2020 0924   GFRAA 76 10/29/2016 0750     Diabetic Labs (most recent): Lab Results  Component Value Date   HGBA1C 7.6 (H) 08/19/2022  HGBA1C 6.9 (A) 01/17/2022   HGBA1C 7.1 (H) 08/30/2021   MICROALBUR <0.7 07/31/2015   MICROALBUR 1.2 04/27/2013   MICROALBUR 0.3 12/31/2009     Lipid Panel ( most recent) Lipid Panel     Component Value Date/Time   CHOL 149 12/31/2020 0924   TRIG 111 12/31/2020 0924   HDL 42 12/31/2020 0924   CHOLHDL 3.5 12/31/2020 0924   CHOLHDL 3.6 10/29/2016 0750   VLDL 22 10/29/2016 0750   LDLCALC 87 12/31/2020 0924   LABVLDL 20 12/31/2020 0924      Lab Results  Component Value Date   TSH 1.750 08/19/2022   TSH 2.540 08/30/2021   TSH 1.840 12/31/2020   TSH 1.710 08/27/2020   TSH 1.620 04/25/2020   TSH 1.390 08/18/2019   TSH 1.70 12/07/2017   TSH 0.012 (L) 07/31/2017   TSH 3.57 10/29/2016   TSH 1.74 07/31/2015   FREET4 1.13 08/30/2021   FREET4 1.32 04/25/2020   FREET4 1.28 08/18/2019   FREET4 1.60 (H) 07/31/2017   FREET4 1.0 10/29/2016   FREET4 1.01 07/22/2009      Assessment / PLAN: 1. Hypercalcemia / Hyperparathyroidism  atient has had several instances of elevated calcium, with the highest level being at 12.2 mg/dL. A corresponding intact PTH level was also high, at 82.  - Patient also has vitamin D deficiency, with the last level being 66.7 (on replacement therapy). -He does  have history of complications from hypercalcemia/hyperparathyroidism with kidney stones, osteopenia, and recent fracture after fall.  He does report intermittent abdominal pain, no mood disorders or bone pain.  - I discussed with the patient about the physiology of calcium and parathyroid hormone, and possible effects of increased PTH/ Calcium, including kidney stones, cardiac dysrhythmias, osteoporosis, abdominal pain, etc.   - The work up so far is not sufficient to reach a conclusion for definitive therapy.  he needs more studies to confirm and classify the parathyroid dysfunction he may have.  I did advise him to avoid calcium supplements including Tums until he sees me again for follow up.  I will proceed to obtain repeat intact PTH/calcium, PTH-rp, serum magnesium, serum phosphorus, vitamin D level.  It is also essential to obtain 24-hour urine calcium/creatinine to rule out the rare but important cause of mild elevation in calcium, and PTH- FHH (Familial Hypocalciuric Hypercalcemia), which may not require any active intervention.  We did discuss potential treatment options such as referral to Dr. Armandina Gemma at [facility] for further evaluation and treatment for hyperparathyroidism vs treatment with Sensipar to reduce calcium levels moving forward.  He seems to be a healthy and active 81 y/o male and may be a good surgical candidate, if warranted.  He prefers not to take medication long-term if able.    Follow Up Plan: Return in about 3 weeks (around 10/27/2022) for hypercalcemia follow up, Previsit labs, 24 hr urine.     - Time spent with the patient: 60 minutes, of which >50% was spent in obtaining information about his symptoms, reviewing his previous labs, evaluations, and treatments, counseling him about his  hypercalcemia , and developing a plan to confirm the diagnosis and long term treatment as necessary.  Please refer to " Patient Self Inventory" in the Media  tab for  reviewed elements of pertinent patient history.  Ronald Kelley participated in the discussions, expressed understanding, and voiced agreement with the above plans.  All questions were answered to his satisfaction. he is encouraged to contact clinic should he  have any questions or concerns prior to his return visit.    Rayetta Pigg, [facility] [address] [place], [place] [address] Phone: [phone] Fax: [phone]  10/06/2022, 11:02 AM

## 2022-10-06 NOTE — Patient Instructions (Signed)
Hyperparathyroidism  Hyperparathyroidism is a condition that is caused by one or more overactive parathyroid glands. There are four parathyroid glands in the front of your neck on or near your thyroid gland. They are the size of a pea. These glands produce a chemical called parathyroid hormone, or PTH. Parathyroid glands release PTH when your blood calcium level is low to ensure a constant supply of calcium. Calcium is needed to make your bones, heart, and muscles healthy. There are two types of hyperparathyroidism: Primary hyperparathyroidism. Secondary hyperparathyroidism. What are the causes? The cause of this condition depends on the type of hyperparathyroidism that you have. Primary hyperparathyroidism is caused by: A benign tumor in one or more of the parathyroid glands (parathyroid adenoma). The adenoma releases too much PTH. This causes high levels of calcium in the blood and may lead to symptoms. PTH raises calcium levels by: Causing your bones to release calcium. Increasing the amount of calcium absorbed from the foods you eat. Making your kidneys retain too much calcium. Genes. In rare cases, primary hyperparathyroidism can be passed down through families. Secondary hyperparathyroidism is usually caused by conditions that cause low calcium. Low calcium triggers the parathyroid glands to make and release more PTH. The glands may become overactive (hyperplasia) and release too much PTH. These conditions are: Kidney failure. When kidneys fail, they are unable to make vitamin D or remove phosphorus, which leads to low calcium levels. Vitamin D deficiency. What increases the risk? You are more likely to develop this condition if: You are a woman. You are 50 years or older. You have chronic kidney disease. What are the signs or symptoms? Mild hyperparathyroidism may not cause any symptoms. If you start to have symptoms, they may include: Muscle weakness. Aches and pains. Feeling tired  (fatigue). Depression. Difficulty thinking. Bone pain or bone fracture from thinning bones. This is from too much calcium in the body. Back or groin pain from kidney stones. This happens if your kidneys retain too much calcium. Symptoms of more severe disease may include: Nausea and vomiting. Increased thirst. Bowel movements that are less frequent or more difficult than usual (constipation). Urinating more often than normal. Confusion. How is this diagnosed? Your health care provider may suspect hyperparathyroidism if the results of a routine blood test show a high level of calcium. In this case, your health care provider may do another blood test to check your PTH level. You may also have other tests to find out the cause of your condition and check for evidence of bone loss or kidney stones. These tests may include: A 24-hour urine collection to find out if you have primary or secondary hyperparathyroidism. A bone scan test to check bone density (DXA scan). Imaging tests of your kidney, such as X-ray, ultrasound, or CT scan. Blood tests for vitamin D and phosphorus. An ultrasound of your parathyroid glands. How is this treated? Mild cases of hyperparathyroidism may not need to be treated. If you are not treated, you will need to have regular blood tests to make sure your calcium and PTH levels are not getting too high. Surgery is the usual treatment if you have primary hyperparathyroidism. Surgery will usually cure the condition. Surgery may be recommended if: You have high calcium. You have low bone density. You have kidney stones. You have weak bones causing a broken bone (fracture). You are younger than 74. Secondary hyperparathyroidism is managed by treating the underlying disorder. For chronic kidney disease: Reducing phosphorus in the diet or taking phosphorus  binders for high levels of phosphorus. A medicine that acts on the parathyroid glands to lower PTH  (cinacalcet). Dialysis or kidney transplant. For vitamin D deficiency, you may be given vitamin D supplements. Follow these instructions at home: Eating and drinking  Follow instructions from your health care provider about eating or drinking. Take supplements only as told by your health care provider. Drink enough fluid to keep your urine pale yellow. General instructions Take over-the-counter and prescription medicines only as told by your health care provider. Exercise as told by your health care provider. Weight-bearing exercise is good for bone health. Ask your health care provider what exercises are safe for you. Return to your normal activities as told by your health care provider. Ask your health care provider what activities are safe for you. Keep all follow-up visits. Your health care provider will need to do tests regularly and adjust your treatment as needed. Where to find more information [employer] of Diabetes and Digestive and Kidney Diseases: [email or web address] [place]: [email or web address].org Contact a health care provider if: Your symptoms do not get better with treatment. Your symptoms get worse. Summary Hyperparathyroidism is a condition caused by one or more overactive parathyroid glands. The parathyroid glands produce a chemical called parathyroid hormone, or PTH. Too much parathyroid hormone causes high levels of blood calcium and may lead to symptoms of hyperparathyroidism. There are two types of hyperparathyroidism: primary and secondary. If this condition is mild, you may not have any symptoms. Hyperparathyroidism is often diagnosed when a routine blood test finds high levels of calcium. Surgery is the best treatment for primary disease with symptoms. If surgery cannot be done, medicines may be of help for people who have mild secondary disease or primary disease. This information is not intended to replace advice given to you by your health care provider.  Make sure you discuss any questions you have with your health care provider. Document Revised: 01/10/2022 Document Reviewed: 01/10/2022 Elsevier Patient Education  [place].

## 2022-10-09 ENCOUNTER — Other Ambulatory Visit: Payer: Self-pay | Admitting: Physician Assistant

## 2022-10-09 DIAGNOSIS — R7989 Other specified abnormal findings of blood chemistry: Secondary | ICD-10-CM

## 2022-10-09 NOTE — Telephone Encounter (Signed)
30 day supply sent. Patient is due for office visit.

## 2022-10-14 LAB — PTH, INTACT AND CALCIUM
Calcium: 11.8 mg/dL — ABNORMAL HIGH (ref 8.6–10.2)
PTH: 61 pg/mL (ref 15–65)

## 2022-10-14 LAB — PHOSPHORUS: Phosphorus: 2.1 mg/dL — ABNORMAL LOW (ref 2.8–4.1)

## 2022-10-14 LAB — VITAMIN D 25 HYDROXY (VIT D DEFICIENCY, FRACTURES): Vit D, 25-Hydroxy: 61.7 ng/mL (ref 30.0–100.0)

## 2022-10-14 LAB — MAGNESIUM: Magnesium: 1.1 mg/dL — ABNORMAL LOW (ref 1.6–2.3)

## 2022-10-17 LAB — CREATININE, URINE, 24 HOUR
Creatinine, 24H Ur: 1267 mg/24 hr (ref 1000–2000)
Creatinine, Urine: 115.2 mg/dL

## 2022-10-17 LAB — CALCIUM, URINE, 24 HOUR
Calcium, 24H Urine: 370 mg/24 hr — ABNORMAL HIGH (ref 0–320)
Calcium, Urine: 33.6 mg/dL

## 2022-10-18 LAB — PTH-RELATED PEPTIDE: PTH-related peptide: <2 pmol/L

## 2022-10-30 ENCOUNTER — Ambulatory Visit: Payer: Medicare HMO | Admitting: Nurse Practitioner

## 2022-10-30 ENCOUNTER — Encounter: Payer: Self-pay | Admitting: Nurse Practitioner

## 2022-10-30 DIAGNOSIS — E210 Primary hyperparathyroidism: Secondary | ICD-10-CM | POA: Diagnosis not present

## 2022-10-30 NOTE — Progress Notes (Signed)
Endocrinology Follow Up Note       10/30/2022, 9:56 AM   SUBJECTIVE:  Ronald Kelley is a 81 y.o.-year-old male, referred by his  Lorrene Reid, PA-C  , for evaluation for hypercalcemia/hyperparathyroidism.   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    ROTATOR CUFF REPAIR      Social History   Tobacco Use   Smoking status: Never   Smokeless tobacco: Never  Vaping Use   Vaping Use: Never used  Substance Use Topics   Alcohol use: No   Drug use: No    Family History  Problem Relation Age of Onset   Hypertension Mother    Diabetes Brother    Alzheimer's disease Father 71   Coronary artery disease Neg Hx    Stroke Neg Hx    Colon cancer Neg Hx    Prostate cancer Neg Hx     Outpatient Encounter Medications as of 10/30/2022  Medication Sig   Ascorbic Acid (VITAMIN C) 100 MG tablet Take 100 mg by mouth daily.   Cholecalciferol (VITAMIN D3) 125 MCG (5000 UT) TABS 5,000 IU OTC vitamin D3 daily.   ELDERBERRY PO Take by mouth.   levothyroxine (SYNTHROID) 50 MCG tablet TAKE 1 TABLET BY MOUTH EVERY DAY BEFORE BREAKFAST   timolol (TIMOPTIC) 0.5 % ophthalmic solution Place 1 drop into both eyes daily.   Zinc Sulfate (ZINC 15 PO) Take by mouth.   No facility-administered encounter medications on file as of 10/30/2022.    Allergies  Allergen Reactions   Hydrocodone-Acetaminophen Other (See Comments)    REACTION: atrial fibrilation   Oxycodone Other (See Comments)    Atrial fibrilation   Oxycodone-Acetaminophen Other (See Comments)    REACTION: atrial fibrilation   ok Darvocet      HPI  Ronald Kelley was diagnosed with hypercalcemia recently but labs show mild hypercalcemia ranging back to 2018.  Patient has no previously known history of parathyroid, pituitary, adrenal dysfunctions; no family history of such dysfunctions. -Review of his referral package of most recent labs reveals calcium of 12.2 the corresponding PTH of 82 on 09/01/22.  Magnesium level was 1.2 on 09/01/22 and Vitamin D level was 66.7 (he does take a daily supplement).  He did suffer a fall back in September and fractured his patella.  He also notes a history kidney stones in the past as well as Osteopenia (no Dexa scan available to review).  No history of CKD. Last BUN/Cr: 15/1.11  he is not on HCTZ or other thiazide therapy.  Does have history of vitamin D deficiency, currently taking Vitamin D3 daily.  he is not on calcium supplements,  he eats dairy and green, leafy, vegetables on average amounts.  He does note  that he was taking Tums quite often prior to his last set of labs for heart burn.  he does not have a family history of hypercalcemia, pituitary tumors, thyroid cancer, or osteoporosis.    ROS:  Constitutional: + stable body weight, + fatigue, no subjective hyperthermia, no subjective hypothermia Eyes: no blurry vision, no xerophthalmia ENT: no sore throat, no nodules palpated in throat, no dysphagia/odynophagia, no hoarseness Cardiovascular: no Chest Pain, no Shortness of Breath, no palpitations (does have history of Afib), no leg swelling Respiratory: no cough, no shortness of breath  Gastrointestinal: no Nausea/Vomiting/Diarrhea, intermittent abdominal pain Musculoskeletal: no muscle/joint aches Skin: no rashes Neurological: no tremors, no numbness, no tingling, no dizziness Psychiatric: no depression, no anxiety, wife reports intermittent  confusion   -------------------------------------------------------------------------------------------------------------------- OBJECTIVE:  BP (!) 147/70 (BP Location: Left Arm, Patient Position: Sitting, Cuff Size: Large)   Pulse (!) 57   Ht '5\' 5"'$  (1.651 m)   Wt 147 lb 6.4 oz (66.9 kg)   BMI 24.53 kg/m , Body mass index is 24.53 kg/m.  Wt Readings from Last 3 Encounters:  10/30/22 147 lb 6.4 oz (66.9 kg)  10/06/22 146 lb 6.4 oz (66.4 kg)  08/18/22 151 lb (68.5 kg)    BP Readings from Last 3 Encounters:  10/30/22 (!) 147/70  10/06/22 131/68  08/18/22 117/70     Physical Exam- Limited  Constitutional:  Body mass index is 24.53 kg/m. , not in acute distress, normal state of mind Eyes:  EOMI, no exophthalmos Musculoskeletal: no gross deformities, strength intact in all four extremities, no gross restriction of joint movements Skin:  no rashes, no hyperemia Neurological: no tremor with outstretched hands   CMP ( most recent) CMP     Component Value Date/Time   NA 143 08/19/2022 1018   K 4.2 08/19/2022 1018   CL 101 08/19/2022 1018   CO2 27 08/19/2022 1018   GLUCOSE 170 (H) 08/19/2022 1018   GLUCOSE 121 (H) 08/01/2017 0244   BUN 15 08/19/2022 1018   CREATININE 1.11 08/19/2022 1018   CREATININE 1.09 10/29/2016 0750   CALCIUM 11.8 (H) 10/13/2022 1416   PROT 6.9 08/19/2022 1018   ALBUMIN 4.3 08/19/2022 1018   AST 18 08/19/2022 1018   ALT 20 08/19/2022 1018   ALKPHOS 102 08/19/2022 1018   BILITOT 1.2 08/19/2022 1018   GFRNONAA 67 12/31/2020 0924   GFRNONAA 66 10/29/2016 0750   GFRAA 77 12/31/2020 0924   GFRAA 76 10/29/2016 0750     Diabetic Labs (most recent): Lab Results  Component Value Date   HGBA1C 7.6 (H) 08/19/2022   HGBA1C 6.9 (A) 01/17/2022   HGBA1C 7.1 (H) 08/30/2021   MICROALBUR <0.7 07/31/2015   MICROALBUR 1.2 04/27/2013   MICROALBUR 0.3 12/31/2009     Lipid Panel ( most recent) Lipid Panel     Component Value Date/Time   CHOL 149  12/31/2020 0924   TRIG 111 12/31/2020 0924   HDL 42 12/31/2020 0924   CHOLHDL 3.5 12/31/2020 0924   CHOLHDL 3.6 10/29/2016 0750   VLDL 22 10/29/2016 0750   LDLCALC 87 12/31/2020 0924   LABVLDL 20 12/31/2020 0924      Lab Results  Component Value Date   TSH 1.750 08/19/2022   TSH 2.540 08/30/2021   TSH 1.840 12/31/2020   TSH 1.710 08/27/2020   TSH 1.620 04/25/2020   TSH 1.390 08/18/2019   TSH 1.70 12/07/2017   TSH 0.012 (L) 07/31/2017   TSH 3.57 10/29/2016   TSH 1.74 07/31/2015  FREET4 1.13 08/30/2021   FREET4 1.32 04/25/2020   FREET4 1.28 08/18/2019   FREET4 1.60 (H) 07/31/2017   FREET4 1.0 10/29/2016   FREET4 1.01 07/22/2009      Assessment / PLAN: 1. Primary hyperparathyroidism  Patient has had several instances of elevated calcium, with the highest level being at 12.2 mg/dL. A corresponding intact PTH level was also high, at 82.  - Patient also has vitamin D deficiency, with the last level being 66.7 (on replacement therapy). -He does have history of complications from hypercalcemia/hyperparathyroidism with kidney stones, osteopenia, and recent fracture after fall.  He does report intermittent abdominal pain, no mood disorders or bone pain.  - I discussed with the patient about the physiology of calcium and parathyroid hormone, and possible effects of increased PTH/ Calcium, including kidney stones, cardiac dysrhythmias, osteoporosis, abdominal pain, etc.   His repeat labs show improvement in calcium and parathyroid hormone levels since he has stopped his Tums but still elevated.  Vitamin D was normal, Phos and Magnesium were slightly low, and 24 hr urine rules out [place].  PTH-rp was also negative.  These tests confirm suspicion of primary hyperparathyroidism.  We did discuss potential treatment options such as referral to Dr. Armandina Gemma at [facility] for further evaluation and treatment for hyperparathyroidism vs treatment with Sensipar to reduce calcium  levels moving forward.  He seems to be a healthy and active 81 y/o male and may be a good surgical candidate, if warranted.  He prefers not to take medication long-term if able.  He and his family decided to proceed with the surgical consult, which I entered today.   Follow Up Plan: Return in about 2 months (around 12/30/2022) for hyperparathyroidism.      I spent 40 minutes in the care of the patient today including review of labs from Thyroid Function, CMP, and other relevant labs ; imaging/biopsy records (current and previous including abstractions from other facilities); face-to-face time discussing  his lab results and symptoms, medications doses, his options of short and long term treatment based on the latest standards of care / guidelines;   and documenting the encounter.  Wael Maestas Stachnik  participated in the discussions, expressed understanding, and voiced agreement with the above plans.  All questions were answered to his satisfaction. he is encouraged to contact clinic should he have any questions or concerns prior to his return visit.    Rayetta Pigg, [facility] [address] [place], [place] [address] Phone: [phone] Fax: [phone]  10/30/2022, 9:56 AM

## 2022-11-26 ENCOUNTER — Ambulatory Visit
Admission: EM | Admit: 2022-11-26 | Discharge: 2022-11-26 | Disposition: A | Discharge status: Home / Self Care | Payer: Medicare HMO | Attending: Physician Assistant | Admitting: Physician Assistant

## 2022-11-26 ENCOUNTER — Ambulatory Visit: Payer: Self-pay

## 2022-11-26 ENCOUNTER — Ambulatory Visit (INDEPENDENT_AMBULATORY_CARE_PROVIDER_SITE_OTHER): Payer: Medicare HMO

## 2022-11-26 DIAGNOSIS — J209 Acute bronchitis, unspecified: Secondary | ICD-10-CM | POA: Diagnosis not present

## 2022-11-26 DIAGNOSIS — R059 Cough, unspecified: Secondary | ICD-10-CM

## 2022-11-26 NOTE — ED Provider Notes (Signed)
[name]    CSN: [id number] Arrival date & time: 11/26/22  1639      History   Chief Complaint Chief Complaint  Patient presents with   APPT : Cough     HPI Ronald Kelley is a 81 y.o. male.   Patient here today for evaluation of productive cough he has had the last 4 weeks. He reports he has had some productive cough at times. He wanted to be sure he did not have pneumonia. He has not had fever. He denies any other symptoms.   The history is provided by the patient.    Past Medical History:  Diagnosis Date   Atrial fib/flutter, transient    Degenerative disk disease    Diabetes mellitus    a1c 6.5, 07/2008   Hyperthyroidism    remote h/o , no ablation   OSA on CPAP     Patient Active Problem List   Diagnosis Date Noted   Ganglion cyst of volar aspect of right wrist 02/26/2021   Orthostatic hypotension 11/18/2020   Community acquired pneumonia, bilateral 11/18/2020   Mass of joint of right wrist 11/18/2020   Palpitations 12/22/2019   Educated about COVID-19 virus infection 12/22/2019   Dizziness on standing 08/31/2019   Closed nondisplaced fracture of pelvis with routine healing 08/31/2019   Weakness generalized 08/31/2019   Elevated serum creatinine 08/31/2019   Serum calcium elevated 08/31/2019   Constipation 08/31/2019   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   Environmental and seasonal allergies 08/31/2019   Ibuprofen adverse reaction 08/31/2019   Vitamin D deficiency 03/23/2019   History of inguinal hernia repair, bilateral 03/23/2019   Tear of medial meniscus of knee 08/10/2018   Acute knee pain 10/12/2017   Hypomagnesemia 08/06/2017   Dehydration, mild 08/06/2017   Elevated serum free T4 level 08/01/2017   Low TSH level 08/01/2017   Atrial fibrillation with RVR ([place]) 07/31/2017   HLD 11/25/2016   Sinusitis 11/05/2016   h/o Leukopenia 11/05/2016   Drug-induced low platelet count 11/05/2016   Impaired fasting glucose 09/27/2016    Degenerative disk disease 09/27/2016   Presbycusis 09/27/2016   History of vitamin D deficiency 09/27/2016   Diet-controlled diabetes mellitus ([place]) 09/24/2016   BPH associated with nocturia 09/24/2016   h/o Osteopenia 07/30/2014   GERD (gastroesophageal reflux disease) 04/28/2013   Annual physical exam 04/27/2013   Erectile dysfunction 10/08/2011   Elevated PSA/ nocturia sx 09/15/2011   OSA on CPAP 05/13/2010   H/O ATRIAL FIBRILLATION, PAROXYSMAL 09/28/2008   h/o Hyperthyroidism 06/23/2008   INGUINAL HERNIA, LEFT 06/23/2008    Past Surgical History:  Procedure Laterality Date   HERNIA REPAIR  2010   L5 acute HNP     s/p surgery 09/21/08-- still has occasional paretheisa of the left foot    ROTATOR CUFF REPAIR         Home Medications    Prior to Admission medications   Medication Sig Start Date End Date Taking? Authorizing Provider  Ascorbic Acid (VITAMIN C) 100 MG tablet Take 100 mg by mouth daily.    [provider]  Cholecalciferol (VITAMIN D3) 125 MCG (5000 UT) TABS 5,000 IU OTC vitamin D3 daily. 08/31/19   Opalski, Deborah, DO  ELDERBERRY PO Take by mouth.    [provider]  levothyroxine (SYNTHROID) 50 MCG tablet TAKE 1 TABLET BY MOUTH EVERY DAY BEFORE BREAKFAST 10/09/22   Abonza, Maritza, PA-C  timolol (TIMOPTIC) 0.5 % ophthalmic solution Place 1 drop into both eyes daily. 07/27/22  [provider]  Zinc Sulfate (ZINC 15 PO) Take by mouth.    [provider]    Family History Family History  Problem Relation Age of Onset   Hypertension Mother    Diabetes Brother    Alzheimer's disease Father 30   Coronary artery disease Neg Hx    Stroke Neg Hx    Colon cancer Neg Hx    Prostate cancer Neg Hx     Social History Social History   Tobacco Use   Smoking status: Never   Smokeless tobacco: Never  Vaping Use   Vaping Use: Never used  Substance Use Topics   Alcohol use: No   Drug use: No     Allergies    Hydrocodone-acetaminophen, Oxycodone, and Oxycodone-acetaminophen   Review of Systems Review of Systems  Constitutional:  Negative for chills and fever.  HENT:  Negative for congestion, ear pain and sore throat.   Eyes:  Negative for discharge and redness.  Respiratory:  Positive for cough. Negative for shortness of breath.   Gastrointestinal:  Negative for abdominal pain, nausea and vomiting.     Physical Exam Triage Vital Signs ED Triage Vitals  Enc Vitals Group     BP 11/26/22 1720 (!) 159/76     Pulse Rate 11/26/22 1720 (!) 55     Resp 11/26/22 1720 17     Temp 11/26/22 1720 98.1 F (36.7 C)     Temp Source 11/26/22 1720 Oral     SpO2 11/26/22 1720 98 %     Weight --      Height --      Head Circumference --      Peak Flow --      Pain Score 11/26/22 1718 1     Pain Loc --      Pain Edu? --      Excl. in [place]? --    No data found.  Updated Vital Signs BP (!) 159/76 (BP Location: Left Arm)   Pulse (!) 55   Temp 98.1 F (36.7 C) (Oral)   Resp 17   SpO2 98%      Physical Exam Vitals and nursing note reviewed.  Constitutional:      General: He is not in acute distress.    Appearance: Normal appearance. He is not ill-appearing.  HENT:     Head: Normocephalic and atraumatic.     Nose: Nose normal. No congestion.     Mouth/Throat:     Mouth: Mucous membranes are moist.     Pharynx: Oropharynx is clear. No oropharyngeal exudate or posterior oropharyngeal erythema.  Eyes:     Conjunctiva/sclera: Conjunctivae normal.  Cardiovascular:     Rate and Rhythm: Normal rate and regular rhythm.     Heart sounds: Normal heart sounds.  Pulmonary:     Effort: Pulmonary effort is normal. No respiratory distress.     Breath sounds: Normal breath sounds. No wheezing, rhonchi or rales.  Skin:    General: Skin is warm and dry.  Neurological:     Mental Status: He is alert.  Psychiatric:        Mood and Affect: Mood normal.        Thought Content: Thought content normal.       UC Treatments / Results  Labs (all labs ordered are listed, but only abnormal results are displayed) Labs Reviewed - No data to display  EKG   Radiology No results found.  Procedures Procedures (including critical care time)  Medications Ordered in UC Medications - No data to display  Initial Impression / Assessment and Plan / UC Course  I have reviewed the triage vital signs and the nursing notes.  Pertinent labs & imaging results that were available during my care of the patient were reviewed by me and considered in my medical decision making (see chart for details).    Discussed that xray was without acute findings. Suspect mild bronchitis- offered steroid burst but patient prefers to defer treatment at this time. Encouraged follow up if no gradual improvement or with any further concerns.   Final Clinical Impressions(s) / UC Diagnoses   Final diagnoses:  Acute bronchitis, unspecified organism   Discharge Instructions   None    ED Prescriptions   None    PDMP not reviewed this encounter.   Francene Finders, PA-C 12/01/22 [phone]

## 2022-11-26 NOTE — ED Triage Notes (Signed)
Pt presents with productive cough X 4 weeks with colored mucous with no relief with OTC medication.

## 2022-12-01 ENCOUNTER — Encounter: Payer: Self-pay | Admitting: Physician Assistant

## 2022-12-05 DIAGNOSIS — E210 Primary hyperparathyroidism: Secondary | ICD-10-CM | POA: Diagnosis not present

## 2022-12-10 ENCOUNTER — Other Ambulatory Visit: Payer: Self-pay | Admitting: Surgery

## 2022-12-10 DIAGNOSIS — E0590 Thyrotoxicosis, unspecified without thyrotoxic crisis or storm: Secondary | ICD-10-CM

## 2022-12-12 DIAGNOSIS — H401131 Primary open-angle glaucoma, bilateral, mild stage: Secondary | ICD-10-CM | POA: Diagnosis not present

## 2022-12-15 ENCOUNTER — Telehealth: Payer: Self-pay

## 2022-12-15 ENCOUNTER — Other Ambulatory Visit: Payer: Self-pay

## 2022-12-15 DIAGNOSIS — R7989 Other specified abnormal findings of blood chemistry: Secondary | ICD-10-CM

## 2022-12-15 MED ORDER — LEVOTHYROXINE SODIUM 50 MCG PO TABS: ORAL_TABLET | ORAL | 0 refills | Status: DC

## 2022-12-15 NOTE — Telephone Encounter (Signed)
Pt is requesting a refill on: levothyroxine (SYNTHROID) 50 MCG tablet    [place] [id number] [place]    LOV 08/18/22 ROV 01/22/23

## 2022-12-15 NOTE — Telephone Encounter (Signed)
Refill sent.

## 2022-12-26 ENCOUNTER — Encounter (HOSPITAL_COMMUNITY)
Admission: RE | Admit: 2022-12-26 | Discharge: 2022-12-26 | Disposition: A | Discharge status: Home / Self Care | Payer: Medicare HMO | Source: Ambulatory Visit | Attending: Surgery | Admitting: Surgery

## 2022-12-26 ENCOUNTER — Ambulatory Visit (HOSPITAL_COMMUNITY)
Admission: RE | Admit: 2022-12-26 | Discharge: 2022-12-26 | Disposition: A | Discharge status: Home / Self Care | Payer: Medicare HMO | Source: Ambulatory Visit | Attending: Surgery | Admitting: Surgery

## 2022-12-26 DIAGNOSIS — E041 Nontoxic single thyroid nodule: Secondary | ICD-10-CM | POA: Diagnosis not present

## 2022-12-26 DIAGNOSIS — E210 Primary hyperparathyroidism: Secondary | ICD-10-CM | POA: Diagnosis not present

## 2022-12-26 DIAGNOSIS — E0590 Thyrotoxicosis, unspecified without thyrotoxic crisis or storm: Secondary | ICD-10-CM | POA: Diagnosis not present

## 2022-12-26 DIAGNOSIS — D351 Benign neoplasm of parathyroid gland: Secondary | ICD-10-CM | POA: Diagnosis not present

## 2022-12-26 MED ORDER — TECHNETIUM TC 99M SESTAMIBI GENERIC - CARDIOLITE
24.6000 | Freq: Once | INTRAVENOUS | Status: AC
  Administered 2022-12-26: 24.6 via INTRAVENOUS

## 2022-12-31 ENCOUNTER — Ambulatory Visit: Payer: Self-pay | Admitting: Surgery

## 2022-12-31 NOTE — Progress Notes (Signed)
Sestamibi scan localizes a right inferior parathyroid adenoma.  This is good news and will make the patient a good candidate for minimally invasive surgery to be done as an outpatient.  We discussed this in the office.  Will enter orders and send to schedulers to contact patient and arrange outpatient surgery at [facility].  Patient does have a thyroid nodule which will require follow up in one year with a repeat USN and exam in the office.  Claiborne Billings - please arrange.  Armandina Gemma, MD [facility] [place] [facility] Office: [phone]

## 2023-01-05 ENCOUNTER — Ambulatory Visit: Payer: Medicare HMO | Admitting: Nurse Practitioner

## 2023-01-05 DIAGNOSIS — E210 Primary hyperparathyroidism: Secondary | ICD-10-CM

## 2023-01-09 NOTE — Progress Notes (Signed)
COVID Vaccine Completed:  Date of COVID positive in last 90 days:  PCP - Lorrene Reid, PA Cardiologist - Minus Breeding, MD  Chest x-ray - 11/26/22 Epic EKG -  Stress Test -  ECHO - 10/04/21 Epic Cardiac Cath -  Pacemaker/ICD device last checked: Spinal Cord Stimulator:  Bowel Prep -   Sleep Study -  CPAP -   Fasting Blood Sugar -  Checks Blood Sugar _____ times a day  Last dose of GLP1 agonist-  N/A GLP1 instructions:  N/A   Last dose of SGLT-2 inhibitors-  N/A SGLT-2 instructions: N/A   Blood Thinner Instructions: Aspirin Instructions: Last Dose:  Activity level:  Can go up a flight of stairs and perform activities of daily living without stopping and without symptoms of chest pain or shortness of breath.  Able to exercise without symptoms  Unable to go up a flight of stairs without symptoms of     Anesthesia review: a fib, HTN, OSA,  Patient denies shortness of breath, fever, cough and chest pain at PAT appointment  Patient verbalized understanding of instructions that were given to them at the PAT appointment. Patient was also instructed that they will need to review over the PAT instructions again at home before surgery.

## 2023-01-09 NOTE — Patient Instructions (Signed)
SURGICAL WAITING ROOM VISITATION  Patients having surgery or a procedure may have no more than 2 support people in the waiting area - these visitors may rotate.    Children under the age of 59 must have an adult with them who is not the patient.  Due to an increase in RSV and influenza rates and associated hospitalizations, children ages 11 and under may not visit patients in [place].  If the patient needs to stay at the hospital during part of their recovery, the visitor guidelines for inpatient rooms apply. Pre-op nurse will coordinate an appropriate time for 1 support person to accompany patient in pre-op.  This support person may not rotate.    Please refer to the [facility] website for the visitor guidelines for Inpatients (after your surgery is over and you are in a regular room).    Your procedure is scheduled on: 01/22/23   Report to [facility] Main Entrance    Report to admitting at 5:45 AM   Call this number if you have problems the morning of surgery [phone]   Do not eat food :After Midnight.   After Midnight you may have the following liquids until 5:00 AM DAY OF SURGERY  Water Non-Citrus Juices (without pulp, NO RED-Camberos, White grape, White cranberry) Black Coffee (NO MILK/CREAM OR CREAMERS, sugar ok)  Clear Tea (NO MILK/CREAM OR CREAMERS, sugar ok) regular and decaf                             Plain Jell-O (NO RED)                                           Fruit ices (not with fruit pulp, NO RED)                                     Popsicles (NO RED)                                                               Sports drinks like Gatorade (NO RED)   The day of surgery:  Drink ONE (1) Pre-Surgery G2 at 5:00 AM the morning of surgery. Drink in one sitting. Do not sip.  This drink was given to you during your hospital  pre-op appointment visit. Nothing else to drink after completing the  Pre-Surgery G2.          If you have questions,  please contact your surgeon's office.   FOLLOW BOWEL PREP AND ANY ADDITIONAL PRE OP INSTRUCTIONS YOU RECEIVED FROM YOUR SURGEON'S OFFICE!!!     Oral Hygiene is also important to reduce your risk of infection.                                    Remember - BRUSH YOUR TEETH THE MORNING OF SURGERY WITH YOUR REGULAR TOOTHPASTE  DENTURES WILL BE REMOVED PRIOR TO SURGERY PLEASE DO NOT APPLY "Poly grip" OR ADHESIVES!!!   Take  these medicines the morning of surgery with A SIP OF WATER: Levothyroxine   DO NOT TAKE ANY ORAL DIABETIC MEDICATIONS DAY OF YOUR SURGERY  How to Manage Your Diabetes Before and After Surgery  Why is it important to control my blood sugar before and after surgery? Improving blood sugar levels before and after surgery helps healing and can limit problems. A way of improving blood sugar control is eating a healthy diet by:  Eating less sugar and carbohydrates  Increasing activity/exercise  Talking with your doctor about reaching your blood sugar goals High blood sugars (greater than 180 mg/dL) can raise your risk of infections and slow your recovery, so you will need to focus on controlling your diabetes during the weeks before surgery. Make sure that the doctor who takes care of your diabetes knows about your planned surgery including the date and location.  How do I manage my blood sugar before surgery? Check your blood sugar at least 4 times a day, starting 2 days before surgery, to make sure that the level is not too high or low. Check your blood sugar the morning of your surgery when you wake up and every 2 hours until you get to the Short Stay unit. If your blood sugar is less than 70 mg/dL, you will need to treat for low blood sugar: Do not take insulin. Treat a low blood sugar (less than 70 mg/dL) with  cup of clear juice (cranberry or Mariotti), 4 glucose tablets, OR glucose gel. Recheck blood sugar in 15 minutes after treatment (to make sure it is greater than 70  mg/dL). If your blood sugar is not greater than 70 mg/dL on recheck, call [phone] for further instructions. Report your blood sugar to the short stay nurse when you get to Short Stay.  If you are admitted to the hospital after surgery: Your blood sugar will be checked by the staff and you will probably be given insulin after surgery (instead of oral diabetes medicines) to make sure you have good blood sugar levels. The goal for blood sugar control after surgery is 80-180 mg/dL.  Reviewed and Endorsed by [facility] Patient Education Committee, August 2015                              You may not have any metal on your body including jewelry, and body piercing             Do not wear lotions, powders, cologne, or deodorant  Do not shave  48 hours prior to surgery.               Men may shave face and neck.   Do not bring valuables to the hospital. [place].   Contacts, glasses, dentures or bridgework may not be worn into surgery.  DO NOT [place]. PHARMACY WILL DISPENSE MEDICATIONS LISTED ON YOUR MEDICATION LIST TO YOU DURING YOUR ADMISSION [place]!    Patients discharged on the day of surgery will not be allowed to drive home.  Someone NEEDS to stay with you for the first 24 hours after anesthesia.              Please read over the following fact sheets you were given: IF [place] [phone]-  Apolonio Schneiders   If you received a COVID test during your pre-op visit  it is requested that you wear a mask when out in public, stay away from anyone that may not be feeling well and notify your surgeon if you develop symptoms. If you test positive for Covid or have been in contact with anyone that has tested positive in the last 10 days please notify you surgeon.    [place] - Preparing for Surgery Before surgery, you can play an important role.   Because skin is not sterile, your skin needs to be as free of germs as possible.  You can reduce the number of germs on your skin by washing with CHG (chlorahexidine gluconate) soap before surgery.  CHG is an antiseptic cleaner which kills germs and bonds with the skin to continue killing germs even after washing. Please DO NOT use if you have an allergy to CHG or antibacterial soaps.  If your skin becomes reddened/irritated stop using the CHG and inform your nurse when you arrive at Short Stay. Do not shave (including legs and underarms) for at least 48 hours prior to the first CHG shower.  You may shave your face/neck.  Please follow these instructions carefully:  1.  Shower with CHG Soap the night before surgery and the  morning of surgery.  2.  If you choose to wash your hair, wash your hair first as usual with your normal  shampoo.  3.  After you shampoo, rinse your hair and body thoroughly to remove the shampoo.                             4.  Use CHG as you would any other liquid soap.  You can apply chg directly to the skin and wash.  Gently with a scrungie or clean washcloth.  5.  Apply the CHG Soap to your body ONLY FROM THE NECK DOWN.   Do   not use on face/ open                           Wound or open sores. Avoid contact with eyes, ears mouth and   genitals (private parts).                       Wash face,  Genitals (private parts) with your normal soap.             6.  Wash thoroughly, paying special attention to the area where your    surgery  will be performed.  7.  Thoroughly rinse your body with warm water from the neck down.  8.  DO NOT shower/wash with your normal soap after using and rinsing off the CHG Soap.                9.  Pat yourself dry with a clean towel.            10.  Wear clean pajamas.            11.  Place clean sheets on your bed the night of your first shower and do not  sleep with pets. Day of Surgery : Do not apply any lotions/deodorants the morning of  surgery.  Please wear clean clothes to the hospital/surgery center.  FAILURE TO FOLLOW THESE INSTRUCTIONS MAY RESULT IN THE CANCELLATION OF YOUR SURGERY  PATIENT SIGNATURE_________________________________  NURSE SIGNATURE__________________________________  ________________________________________________________________________ 

## 2023-01-11 ENCOUNTER — Encounter: Payer: Self-pay | Admitting: Surgery

## 2023-01-11 DIAGNOSIS — E210 Primary hyperparathyroidism: Secondary | ICD-10-CM | POA: Diagnosis present

## 2023-01-11 NOTE — H&P (Signed)
REFERRING PHYSICIAN: Brita Romp, NP  PROVIDER: [place] Charlotta Newton, MD   Chief Complaint: New Problem (Primary hyperparathyroidism)  History of Present Illness:  Patient is referred by Rayetta Pigg, NP, for surgical evaluation and recommendations regarding hypercalcemia and suspected primary hyperparathyroidism. Patient had been noted to have elevated serum calcium levels. Patient complains of fatigue, confusion, joint pain, and nephrolithiasis. Patient underwent laboratory studies demonstrating an elevated serum calcium level at 11.8 and an unsuppressed PTH level of 61. 24-hour urine collection for calcium was elevated at 370. 25-hydroxy vitamin D level was normal at 61.7. Patient has had no prior head or neck surgery. There is no family history of parathyroid disease. Patient presents for evaluation accompanied by his wife, son, and daughter-in-law.  Review of Systems: A complete review of systems was obtained from the patient. I have reviewed this information and discussed as appropriate with the patient. See HPI as well for other ROS.  Review of Systems Constitutional: Positive for malaise/fatigue. HENT: Negative. Eyes: Negative. Respiratory: Negative. Cardiovascular: Negative. Gastrointestinal: Positive for constipation. Genitourinary: Negative. Musculoskeletal: Positive for joint pain and neck pain. Skin: Negative. Neurological: Negative. Endo/Heme/Allergies: Negative. Psychiatric/Behavioral: Negative.   Medical History: Past Medical History: Diagnosis Date Anxiety Arthritis GERD (gastroesophageal reflux disease) Sleep apnea Thyroid disease  Patient Active Problem List Diagnosis Atrial fibrillation with RVR (CMS-HCC) BPH associated with nocturia Closed nondisplaced fracture of pelvis with routine healing Community acquired pneumonia, bilateral Constipation Degenerative disk disease Dehydration, mild Diet-controlled diabetes mellitus  (CMS-HCC) Dizziness on standing Drug-induced low platelet count Elevated PSA Elevated serum creatinine Elevated serum free T4 level Environmental and seasonal allergies Erectile dysfunction Functional burping disorder Ganglion cyst of volar aspect of right wrist GERD (gastroesophageal reflux disease) History of inguinal hernia repair, bilateral History of vitamin D deficiency HLD (hyperlipidemia) Hyperthyroidism Hypomagnesemia Impaired fasting glucose Ibuprofen adverse reaction Inguinal hernia, left Leukopenia Low TSH level Mass of joint of right wrist Orthostatic hypotension OSA on CPAP Osteopenia Palpitations Presbycusis Serum calcium elevated Sinusitis, unspecified Tear of medial meniscus of knee Vitamin D deficiency Weakness generalized Annual physical exam Acute knee pain Primary hyperparathyroidism (CMS-HCC)  Past Surgical History: Procedure Laterality Date ARTHROSCOPIC ROTATOR CUFF REPAIR HERNIA REPAIR   Allergies Allergen Reactions Oxycodone-Acetaminophen Other (See Comments) REACTION: atrial fibrilation  ok Darvocet  Oxycodone Other (See Comments) Atrial fibrilation   Current Outpatient Medications on File Prior to Visit Medication Sig Dispense Refill ascorbic acid, vitamin C, (VITAMIN C) 100 MG tablet Take 100 mg by mouth once daily cholecalciferol 1000 unit tablet Take by mouth cyanocobalamin (VITAMIN B12) 1000 MCG tablet Take 1,000 mcg by mouth once daily levothyroxine (SYNTHROID) 50 MCG tablet Take 50 mcg by mouth every morning before breakfast (0630)  No current facility-administered medications on file prior to visit.  Family History Problem Relation Age of Onset High blood pressure (Hypertension) Mother Diabetes Brother   Social History  Tobacco Use Smoking Status Never Smokeless Tobacco Never   Social History  Socioeconomic History Marital status: Married Tobacco Use Smoking status: Never Smokeless tobacco:  Never Vaping Use Vaping Use: Never used Substance and Sexual Activity Alcohol use: Not Currently Drug use: Never  Objective:  Physical Exam  GENERAL APPEARANCE Comfortable, no acute issues Development: normal Gross deformities: none  SKIN Rash, lesions, ulcers: none Induration, erythema: none Nodules: none palpable  EYES Conjunctiva and lids: normal Pupils: equal and reactive  EARS, NOSE, MOUTH, THROAT External ears: no lesion or deformity External nose: no lesion or deformity Hearing: grossly normal  NECK Symmetric: yes  Trachea: midline Thyroid: no palpable nodules in the thyroid bed  CHEST Respiratory effort: normal Retraction or accessory muscle use: no Breath sounds: normal bilaterally Rales, rhonchi, wheeze: none  CARDIOVASCULAR Auscultation: regular rhythm, normal rate Murmurs: none Pulses: radial pulse 2+ palpable Lower extremity edema: none  ABDOMEN Not assessed  GENITOURINARY/RECTAL Not assessed  MUSCULOSKELETAL Station and gait: normal Digits and nails: no clubbing or cyanosis Muscle strength: grossly normal all extremities Range of motion: grossly normal all extremities Deformity: none  LYMPHATIC Cervical: none palpable Supraclavicular: none palpable  PSYCHIATRIC Oriented to person, place, and time: yes Mood and affect: normal for situation Judgment and insight: appropriate for situation   Assessment and Plan:  Primary hyperparathyroidism (CMS-HCC)  Patient is referred by Rayetta Pigg, NP, for surgical evaluation and recommendations regarding newly diagnosed primary hyperparathyroidism.  Patient provided with a copy of "Parathyroid Surgery: Treatment for Your Parathyroid Gland Problem", published by [employer], 12 pages. Book reviewed and explained to patient during visit today.  Today we reviewed his clinical history and symptoms. We discussed his laboratory results. I provided him with written literature on parathyroid disease  to review at home. I would like to proceed with a nuclear medicine parathyroid scan and an ultrasound examination of the neck. These are successful and localizing an adenoma and confirming the diagnosis approximately 80% of the time. If they fail to diagnose the adenoma, then we will obtain a 4D CT scan of the neck. We discussed minimally invasive parathyroidectomy. This would be done as an outpatient surgical procedure. We discussed the risk and benefits. We discussed the size of the surgical incision. We discussed his postoperative recovery. The patient understands and agrees to proceed with further imaging studies.  Patient will undergo the above studies. We will contact him with the results and make a decision on how to proceed once they are available.  Armandina Gemma, MD [facility] A [place] practice Office: [phone]

## 2023-01-12 ENCOUNTER — Encounter (HOSPITAL_COMMUNITY): Payer: Medicare HMO

## 2023-01-12 ENCOUNTER — Encounter (HOSPITAL_COMMUNITY): Payer: Self-pay

## 2023-01-12 ENCOUNTER — Encounter (HOSPITAL_COMMUNITY)
Admission: RE | Admit: 2023-01-12 | Discharge: 2023-01-12 | Disposition: A | Discharge status: Home / Self Care | Payer: Medicare HMO | Source: Ambulatory Visit | Attending: Surgery | Admitting: Surgery

## 2023-01-12 VITALS — BP 151/91 | HR 67 | Temp 98.1°F | Resp 16 | Ht 65.0 in | Wt 149.6 lb

## 2023-01-12 DIAGNOSIS — I10 Essential (primary) hypertension: Secondary | ICD-10-CM | POA: Diagnosis not present

## 2023-01-12 DIAGNOSIS — E119 Type 2 diabetes mellitus without complications: Secondary | ICD-10-CM | POA: Insufficient documentation

## 2023-01-12 DIAGNOSIS — Z01818 Encounter for other preprocedural examination: Secondary | ICD-10-CM | POA: Diagnosis not present

## 2023-01-12 DIAGNOSIS — I4891 Unspecified atrial fibrillation: Secondary | ICD-10-CM | POA: Diagnosis not present

## 2023-01-12 HISTORY — DX: Personal history of urinary calculi: Z87.442

## 2023-01-12 HISTORY — DX: Gastro-esophageal reflux disease without esophagitis: K21.9

## 2023-01-12 LAB — BASIC METABOLIC PANEL
Anion gap: 6 (ref 5–15)
BUN: 14 mg/dL (ref 8–23)
CO2: 29 mmol/L (ref 22–32)
Calcium: 11.7 mg/dL — ABNORMAL HIGH (ref 8.9–10.3)
Chloride: 105 mmol/L (ref 98–111)
Creatinine, Ser: 1.01 mg/dL (ref 0.61–1.24)
GFR, Estimated: >60 mL/min (ref >60)
Glucose, Bld: 135 mg/dL — ABNORMAL HIGH (ref 70–99)
Potassium: 3.9 mmol/L (ref 3.5–5.1)
Sodium: 140 mmol/L (ref 135–145)

## 2023-01-12 LAB — HEMOGLOBIN A1C
Hgb A1c MFr Bld: 6.9 % — ABNORMAL HIGH (ref 4.8–5.6)
Mean Plasma Glucose: 151.33 mg/dL

## 2023-01-12 LAB — CBC
HCT: 38.2 % — ABNORMAL LOW (ref 39.0–52.0)
Hemoglobin: 12.3 g/dL — ABNORMAL LOW (ref 13.0–17.0)
MCH: 30.1 pg (ref 26.0–34.0)
MCHC: 32.2 g/dL (ref 30.0–36.0)
MCV: 93.6 fL (ref 80.0–100.0)
Platelets: 135 10*3/uL — ABNORMAL LOW (ref 150–400)
RBC: 4.08 MIL/uL — ABNORMAL LOW (ref 4.22–5.81)
RDW: 12.7 % (ref 11.5–15.5)
WBC: 9.3 10*3/uL (ref 4.0–10.5)
nRBC: 0 % (ref 0.0–0.2)

## 2023-01-12 LAB — GLUCOSE, CAPILLARY: Glucose-Capillary: 157 mg/dL — ABNORMAL HIGH (ref 70–99)

## 2023-01-21 NOTE — Anesthesia Preprocedure Evaluation (Signed)
Anesthesia Evaluation  Patient identified by MRN, date of birth, ID band Patient awake    Reviewed: Allergy & Precautions, NPO status , Patient's Chart, lab work & pertinent test results  Airway Mallampati: II  TM Distance: >3 FB Neck ROM: Full    Dental no notable dental hx. (+) Teeth Intact, Dental Advisory Given   Pulmonary    Pulmonary exam normal breath sounds clear to auscultation       Cardiovascular Normal cardiovascular exam+ dysrhythmias Atrial Fibrillation  Rhythm:Regular Rate:Normal     Neuro/Psych    GI/Hepatic ,GERD  ,,  Endo/Other  diabetes Hyperthyroidism   Renal/GU Lab Results      Component                Value               Date                      CREATININE               1.01                01/12/2023                BUN                      14                  01/12/2023                NA                       140                 01/12/2023                K                        3.9                 01/12/2023                    Musculoskeletal  (+) Arthritis ,    Abdominal   Peds  Hematology Lab Results      Component                Value               Date                      WBC                      9.3                 01/12/2023                HGB                      12.3 (L)            01/12/2023                HCT                      38.2 (L)  01/12/2023                MCV                      93.6                01/12/2023                PLT                      135 (L)             01/12/2023              Anesthesia Other Findings   Reproductive/Obstetrics                             Anesthesia Physical Anesthesia Plan  ASA: 3  Anesthesia Plan: General   Post-op Pain Management: Ofirmev IV (intra-op)*   Induction: Intravenous  PONV Risk Score and Plan: 3 and Treatment may vary due to age or medical condition and Ondansetron  Airway  Management Planned: Oral ETT  Additional Equipment:   Intra-op Plan:   Post-operative Plan: Extubation in OR  Informed Consent: I have reviewed the patients History and Physical, chart, labs and discussed the procedure including the risks, benefits and alternatives for the proposed anesthesia with the patient or authorized representative who has indicated his/her understanding and acceptance.     Dental advisory given  Plan Discussed with: CRNA, Anesthesiologist and Surgeon  Anesthesia Plan Comments:        Anesthesia Quick Evaluation

## 2023-01-22 ENCOUNTER — Ambulatory Visit (HOSPITAL_COMMUNITY)
Admission: RE | Admit: 2023-01-22 | Discharge: 2023-01-22 | Disposition: A | Discharge status: Home / Self Care | Payer: Medicare HMO | Source: Ambulatory Visit | Attending: Surgery | Admitting: Surgery

## 2023-01-22 ENCOUNTER — Other Ambulatory Visit: Payer: Self-pay

## 2023-01-22 ENCOUNTER — Encounter (HOSPITAL_COMMUNITY)
Admission: RE | Disposition: A | Discharge status: Home / Self Care | Payer: Self-pay | Source: Ambulatory Visit | Attending: Surgery

## 2023-01-22 ENCOUNTER — Ambulatory Visit: Payer: Medicare HMO | Admitting: Physician Assistant

## 2023-01-22 ENCOUNTER — Ambulatory Visit (HOSPITAL_COMMUNITY): Payer: Medicare HMO | Admitting: Physician Assistant

## 2023-01-22 ENCOUNTER — Ambulatory Visit (HOSPITAL_BASED_OUTPATIENT_CLINIC_OR_DEPARTMENT_OTHER): Payer: Medicare HMO | Admitting: Certified Registered Nurse Anesthetist

## 2023-01-22 ENCOUNTER — Encounter (HOSPITAL_COMMUNITY): Payer: Self-pay | Admitting: Surgery

## 2023-01-22 DIAGNOSIS — K219 Gastro-esophageal reflux disease without esophagitis: Secondary | ICD-10-CM | POA: Diagnosis not present

## 2023-01-22 DIAGNOSIS — E210 Primary hyperparathyroidism: Secondary | ICD-10-CM | POA: Diagnosis present

## 2023-01-22 DIAGNOSIS — G4733 Obstructive sleep apnea (adult) (pediatric): Secondary | ICD-10-CM | POA: Diagnosis not present

## 2023-01-22 DIAGNOSIS — E119 Type 2 diabetes mellitus without complications: Secondary | ICD-10-CM | POA: Diagnosis not present

## 2023-01-22 DIAGNOSIS — M1990 Unspecified osteoarthritis, unspecified site: Secondary | ICD-10-CM

## 2023-01-22 DIAGNOSIS — Z79890 Hormone replacement therapy (postmenopausal): Secondary | ICD-10-CM | POA: Diagnosis not present

## 2023-01-22 DIAGNOSIS — I4891 Unspecified atrial fibrillation: Secondary | ICD-10-CM | POA: Diagnosis not present

## 2023-01-22 DIAGNOSIS — M8580 Other specified disorders of bone density and structure, unspecified site: Secondary | ICD-10-CM | POA: Diagnosis not present

## 2023-01-22 DIAGNOSIS — D351 Benign neoplasm of parathyroid gland: Secondary | ICD-10-CM | POA: Insufficient documentation

## 2023-01-22 DIAGNOSIS — E785 Hyperlipidemia, unspecified: Secondary | ICD-10-CM | POA: Insufficient documentation

## 2023-01-22 DIAGNOSIS — Z79899 Other long term (current) drug therapy: Secondary | ICD-10-CM | POA: Insufficient documentation

## 2023-01-22 HISTORY — PX: PARATHYROIDECTOMY: SHX19

## 2023-01-22 LAB — GLUCOSE, CAPILLARY: Glucose-Capillary: 169 mg/dL — ABNORMAL HIGH (ref 70–99)

## 2023-01-22 SURGERY — PARATHYROIDECTOMY
Anesthesia: General | Laterality: Right

## 2023-01-22 MED ORDER — ONDANSETRON HCL 4 MG/2ML IJ SOLN
INTRAMUSCULAR | Status: AC
  Filled 2023-01-22: qty 2

## 2023-01-22 MED ORDER — LACTATED RINGERS IV SOLN: INTRAVENOUS | Status: DC

## 2023-01-22 MED ORDER — DEXAMETHASONE SODIUM PHOSPHATE 10 MG/ML IJ SOLN
INTRAMUSCULAR | Status: AC
  Filled 2023-01-22: qty 1

## 2023-01-22 MED ORDER — CEFAZOLIN SODIUM-DEXTROSE 2-4 GM/100ML-% IV SOLN
2.0000 g | INTRAVENOUS | Status: AC
  Administered 2023-01-22: 2 g via INTRAVENOUS
  Filled 2023-01-22: qty 100

## 2023-01-22 MED ORDER — HYDRALAZINE HCL 20 MG/ML IJ SOLN
5.0000 mg | Freq: Once | INTRAMUSCULAR | Status: AC
  Administered 2023-01-22: 5 mg via INTRAVENOUS

## 2023-01-22 MED ORDER — LIDOCAINE 2% (20 MG/ML) 5 ML SYRINGE
INTRAMUSCULAR | Status: DC | PRN
  Administered 2023-01-22: 80 mg via INTRAVENOUS

## 2023-01-22 MED ORDER — CHLORHEXIDINE GLUCONATE CLOTH 2 % EX PADS: 6.0000 | MEDICATED_PAD | Freq: Once | CUTANEOUS | Status: DC

## 2023-01-22 MED ORDER — PROPOFOL 10 MG/ML IV BOLUS
INTRAVENOUS | Status: AC
  Filled 2023-01-22: qty 20

## 2023-01-22 MED ORDER — ACETAMINOPHEN 10 MG/ML IV SOLN
INTRAVENOUS | Status: AC
  Filled 2023-01-22: qty 100

## 2023-01-22 MED ORDER — ROCURONIUM BROMIDE 10 MG/ML (PF) SYRINGE
PREFILLED_SYRINGE | INTRAVENOUS | Status: AC
  Filled 2023-01-22: qty 10

## 2023-01-22 MED ORDER — HEMOSTATIC AGENTS (NO CHARGE) OPTIME
TOPICAL | Status: DC | PRN
  Administered 2023-01-22: 1 via TOPICAL

## 2023-01-22 MED ORDER — 0.9 % SODIUM CHLORIDE (POUR BTL) OPTIME
TOPICAL | Status: DC | PRN
  Administered 2023-01-22: 1000 mL

## 2023-01-22 MED ORDER — SUGAMMADEX SODIUM 200 MG/2ML IV SOLN
INTRAVENOUS | Status: DC | PRN
  Administered 2023-01-22: 200 mg via INTRAVENOUS

## 2023-01-22 MED ORDER — PHENYLEPHRINE 80 MCG/ML (10ML) SYRINGE FOR IV PUSH (FOR BLOOD PRESSURE SUPPORT)
PREFILLED_SYRINGE | INTRAVENOUS | Status: AC
  Filled 2023-01-22: qty 10

## 2023-01-22 MED ORDER — DEXMEDETOMIDINE HCL IN NACL 80 MCG/20ML IV SOLN
INTRAVENOUS | Status: DC | PRN
  Administered 2023-01-22 (×2): 4 ug via BUCCAL

## 2023-01-22 MED ORDER — FENTANYL CITRATE PF 50 MCG/ML IJ SOSY
PREFILLED_SYRINGE | INTRAMUSCULAR | Status: AC
  Filled 2023-01-22: qty 1

## 2023-01-22 MED ORDER — FENTANYL CITRATE PF 50 MCG/ML IJ SOSY
25.0000 ug | PREFILLED_SYRINGE | INTRAMUSCULAR | Status: DC | PRN
  Administered 2023-01-22: 50 ug via INTRAVENOUS

## 2023-01-22 MED ORDER — BUPIVACAINE HCL 0.25 % IJ SOLN
INTRAMUSCULAR | Status: DC | PRN
  Administered 2023-01-22: 8 mL

## 2023-01-22 MED ORDER — BUPIVACAINE HCL (PF) 0.25 % IJ SOLN
INTRAMUSCULAR | Status: AC
  Filled 2023-01-22: qty 30

## 2023-01-22 MED ORDER — ONDANSETRON HCL 4 MG/2ML IJ SOLN: 4.0000 mg | Freq: Once | INTRAMUSCULAR | Status: DC | PRN

## 2023-01-22 MED ORDER — ONDANSETRON HCL 4 MG/2ML IJ SOLN
INTRAMUSCULAR | Status: DC | PRN
  Administered 2023-01-22: 4 mg via INTRAVENOUS

## 2023-01-22 MED ORDER — FENTANYL CITRATE (PF) 100 MCG/2ML IJ SOLN
INTRAMUSCULAR | Status: AC
  Filled 2023-01-22: qty 2

## 2023-01-22 MED ORDER — EPHEDRINE 5 MG/ML INJ
INTRAVENOUS | Status: AC
  Filled 2023-01-22: qty 5

## 2023-01-22 MED ORDER — ORAL CARE MOUTH RINSE: 15.0000 mL | Freq: Once | OROMUCOSAL | Status: AC

## 2023-01-22 MED ORDER — FENTANYL CITRATE (PF) 100 MCG/2ML IJ SOLN
INTRAMUSCULAR | Status: DC | PRN
  Administered 2023-01-22 (×2): 50 ug via INTRAVENOUS

## 2023-01-22 MED ORDER — ROCURONIUM BROMIDE 10 MG/ML (PF) SYRINGE
PREFILLED_SYRINGE | INTRAVENOUS | Status: DC | PRN
  Administered 2023-01-22: 40 mg via INTRAVENOUS

## 2023-01-22 MED ORDER — PROPOFOL 10 MG/ML IV BOLUS
INTRAVENOUS | Status: DC | PRN
  Administered 2023-01-22: 100 mg via INTRAVENOUS

## 2023-01-22 MED ORDER — DEXAMETHASONE SODIUM PHOSPHATE 10 MG/ML IJ SOLN
INTRAMUSCULAR | Status: DC | PRN
  Administered 2023-01-22: 10 mg via INTRAVENOUS

## 2023-01-22 MED ORDER — ACETAMINOPHEN 10 MG/ML IV SOLN
1000.0000 mg | Freq: Once | INTRAVENOUS | Status: DC | PRN
  Administered 2023-01-22: 1000 mg via INTRAVENOUS

## 2023-01-22 MED ORDER — TRAMADOL HCL 50 MG PO TABS: 50.0000 mg | ORAL_TABLET | Freq: Four times a day (QID) | ORAL | 0 refills | Status: DC | PRN

## 2023-01-22 MED ORDER — CHLORHEXIDINE GLUCONATE 0.12 % MT SOLN
15.0000 mL | Freq: Once | OROMUCOSAL | Status: AC
  Administered 2023-01-22: 15 mL via OROMUCOSAL

## 2023-01-22 MED ORDER — HYDRALAZINE HCL 20 MG/ML IJ SOLN
INTRAMUSCULAR | Status: AC
  Filled 2023-01-22: qty 1

## 2023-01-22 SURGICAL SUPPLY — 37 items
ADH SKN CLS APL DERMABOND .7 (GAUZE/BANDAGES/DRESSINGS) ×1
APL PRP STRL LF DISP 70% ISPRP (MISCELLANEOUS) ×1
ATTRACTOMAT 16X20 MAGNETIC DRP (DRAPES) ×1 IMPLANT
BAG COUNTER SPONGE SURGICOUNT (BAG) ×1 IMPLANT
BAG SPNG CNTER NS LX DISP (BAG) ×1
BLADE SURG 15 STRL LF DISP TIS (BLADE) ×1 IMPLANT
BLADE SURG 15 STRL SS (BLADE) ×1
CHLORAPREP W/TINT 26 (MISCELLANEOUS) ×1 IMPLANT
CLIP TI MEDIUM 6 (CLIP) ×2 IMPLANT
CLIP TI WIDE RED SMALL 6 (CLIP) ×2 IMPLANT
COVER SURGICAL LIGHT HANDLE (MISCELLANEOUS) ×1 IMPLANT
DERMABOND ADVANCED .7 DNX12 (GAUZE/BANDAGES/DRESSINGS) ×1 IMPLANT
DRAPE LAPAROTOMY T 98X78 PEDS (DRAPES) ×1 IMPLANT
DRAPE UTILITY XL STRL (DRAPES) ×1 IMPLANT
ELECT REM PT RETURN 15FT ADLT (MISCELLANEOUS) ×1 IMPLANT
GAUZE 4X4 16PLY ~~LOC~~+RFID DBL (SPONGE) ×1 IMPLANT
GLOVE SURG ORTHO 8.0 STRL STRW (GLOVE) ×1 IMPLANT
GOWN STRL REUS W/ TWL XL LVL3 (GOWN DISPOSABLE) ×3 IMPLANT
GOWN STRL REUS W/TWL XL LVL3 (GOWN DISPOSABLE) ×3
HEMOSTAT SURGICEL 2X4 FIBR (HEMOSTASIS) ×1 IMPLANT
ILLUMINATOR WAVEGUIDE N/F (MISCELLANEOUS) IMPLANT
KIT BASIN OR (CUSTOM PROCEDURE TRAY) ×1 IMPLANT
KIT TURNOVER KIT A (KITS) IMPLANT
NDL HYPO 25X1 1.5 SAFETY (NEEDLE) ×1 IMPLANT
NEEDLE HYPO 25X1 1.5 SAFETY (NEEDLE) ×1 IMPLANT
PACK BASIC VI WITH GOWN DISP (CUSTOM PROCEDURE TRAY) ×1 IMPLANT
PENCIL SMOKE EVACUATOR (MISCELLANEOUS) ×1 IMPLANT
SHEARS HARMONIC 9CM CVD (BLADE) IMPLANT
SUT MNCRL AB 4-0 PS2 18 (SUTURE) ×1 IMPLANT
SUT SILK 3 0 (SUTURE) ×1
SUT SILK 3-0 18XBRD TIE 12 (SUTURE) IMPLANT
SUT VIC AB 3-0 SH 18 (SUTURE) ×1 IMPLANT
SYR BULB IRRIG 60ML STRL (SYRINGE) ×1 IMPLANT
SYR CONTROL 10ML LL (SYRINGE) ×1 IMPLANT
TOWEL OR 17X26 10 PK STRL BLUE (TOWEL DISPOSABLE) ×1 IMPLANT
TOWEL OR NON WOVEN STRL DISP B (DISPOSABLE) ×1 IMPLANT
TUBING CONNECTING 10 (TUBING) ×1 IMPLANT

## 2023-01-22 NOTE — Anesthesia Postprocedure Evaluation (Signed)
Anesthesia Post Note  Patient: Ronald Kelley  Procedure(s) Performed: RIGHT INFERIOR PARATHYROIDECTOMY (Right)     Patient location during evaluation: PACU Anesthesia Type: General Level of consciousness: awake and alert Pain management: pain level controlled Vital Signs Assessment: post-procedure vital signs reviewed and stable Respiratory status: spontaneous breathing, nonlabored ventilation, respiratory function stable and patient connected to nasal cannula oxygen Cardiovascular status: blood pressure returned to baseline and stable Postop Assessment: no apparent nausea or vomiting Anesthetic complications: no  No notable events documented.  Last Vitals:  Vitals:   01/22/23 1145 01/22/23 1200  BP: (!) 207/60 (!) 200/70  Pulse:  (!) 54  Resp:    Temp:    SpO2: 96% 99%    Last Pain:  Vitals:   01/22/23 1208  TempSrc:   PainSc: 2                  [place]

## 2023-01-22 NOTE — Transfer of Care (Signed)
Immediate Anesthesia Transfer of Care Note  Patient: Ronald Kelley  Procedure(s) Performed: Procedure(s): RIGHT INFERIOR PARATHYROIDECTOMY (Right)  Patient Location: PACU  Anesthesia Type:General  Level of Consciousness: Alert, Awake, Oriented  Airway & Oxygen Therapy: Patient Spontanous Breathing  Post-op Assessment: Report given to RN  Post vital signs: Reviewed and stable  Last Vitals:  Vitals:   01/22/23 0633 01/22/23 0952  BP: (!) 158/85 (!) 187/67  Pulse: 66 (!) 56  Resp: 16 10  Temp: 36.6 C   SpO2: [id number]    Complications: No apparent anesthesia complications

## 2023-01-22 NOTE — Op Note (Signed)
OPERATIVE REPORT - PARATHYROIDECTOMY  Preoperative diagnosis: Primary hyperparathyroidism  Postop diagnosis: Same  Procedure: Right minimally invasive parathyroidectomy  Surgeon:  Armandina Gemma, MD  Anesthesia: General endotracheal  Estimated blood loss: Minimal  Preparation: ChloraPrep  Indications: Patient is referred by Rayetta Pigg, NP, for surgical evaluation and recommendations regarding hypercalcemia and suspected primary hyperparathyroidism. Patient had been noted to have elevated serum calcium levels. Patient complains of fatigue, confusion, joint pain, and nephrolithiasis. Patient underwent laboratory studies demonstrating an elevated serum calcium level at 11.8 and an unsuppressed PTH level of 61. 24-hour urine collection for calcium was elevated at 370. 25-hydroxy vitamin D level was normal at 61.7. Nuclear med parathyroid scan localized a right inferior adenoma.  Patient now comes to surgery for parathyroidectomy.  Procedure: The patient was prepared in the pre-operative holding area. The patient was brought to the operating room and placed in a supine position on the operating room table. Following administration of general anesthesia, the patient was positioned and then prepped and draped in the usual strict aseptic fashion. After ascertaining that an adequate level of anesthesia been achieved, a neck incision was made with a #15 blade. Dissection was carried through subcutaneous tissues and platysma. Hemostasis was obtained with the electrocautery. Skin flaps were developed circumferentially and a Weitlander retractor was placed for exposure.  Strap muscles were incised in the midline. Strap muscles were reflected laterally exposing the thyroid lobe. With gentle blunt dissection the thyroid lobe was mobilized.  Dissection was carried posteriorly and an enlarged parathyroid gland was identified. It was gently mobilized. Vascular structures were divided between small ligaclips.  Care was taken to avoid the recurrent laryngeal nerve. The parathyroid gland was completely excised. It was submitted to pathology where frozen section confirmed hypercellular parathyroid tissue consistent with adenoma.  Neck was irrigated with warm saline and good hemostasis was noted. Fibrillar was placed in the operative field. Strap muscles were approximated in the midline with interrupted 3-0 Vicryl sutures. Platysma was closed with interrupted 3-0 Vicryl sutures. Marcaine was infiltrated circumferentially. Skin was closed with a running 4-0 Monocryl subcuticular suture. Wound was washed and dried and Dermabond was applied. Patient was awakened from anesthesia and brought to the recovery room. The patient tolerated the procedure well.   Armandina Gemma, [place] [facility] Office: [phone]

## 2023-01-22 NOTE — Anesthesia Procedure Notes (Signed)
Procedure Name: Intubation Date/Time: 01/22/2023 8:26 AM  Performed by: Gerald Leitz, CRNAPre-anesthesia Checklist: Patient identified, Patient being monitored, Timeout performed, Emergency Drugs available and Suction available Patient Re-evaluated:Patient Re-evaluated prior to induction Oxygen Delivery Method: Circle system utilized Preoxygenation: Pre-oxygenation with 100% oxygen Induction Type: IV induction Ventilation: Mask ventilation without difficulty Laryngoscope Size: Mac and 3 Grade View: Grade I Tube type: Oral Tube size: 7.0 mm Number of attempts: 1 Airway Equipment and Method: Stylet Placement Confirmation: ETT inserted through vocal cords under direct vision, positive ETCO2 and breath sounds checked- equal and bilateral Secured at: 22 cm Tube secured with: Tape Dental Injury: Teeth and Oropharynx as per pre-operative assessment

## 2023-01-22 NOTE — Interval H&P Note (Signed)
History and Physical Interval Note:  01/22/2023 8:05 AM  Ronald Kelley  has presented today for surgery, with the diagnosis of primary hyperparathyroidism.  The various methods of treatment have been discussed with the patient and family. After consideration of risks, benefits and other options for treatment, the patient has consented to    Procedure(s): RIGHT INFERIOR PARATHYROIDECTOMY (Right) as a surgical intervention.    The patient's history has been reviewed, patient examined, no change in status, stable for surgery.  I have reviewed the patient's chart and labs.  Questions were answered to the patient's satisfaction.    Armandina Gemma, [place] [facility] A [place] practice Office: [place]

## 2023-01-22 NOTE — Discharge Instructions (Addendum)
[facility] - Dr. Todd Gerkin  THYROID & PARATHYROID SURGERY:  POST-OP INSTRUCTIONS  Always review the instruction sheet provided by the hospital nurse at discharge.  A prescription for pain medication may be sent to your pharmacy at the time of discharge.  Take your pain medication as prescribed.  If narcotic pain medicine is not needed, then you may take acetaminophen (Tylenol) or ibuprofen (Advil) as needed for pain or soreness.  Take your normal home medications as prescribed unless otherwise directed.  If you need a refill on your pain medication, please contact the office during regular business hours.  Prescriptions will not be processed by the office after 5:00PM or on weekends.  Start with a light diet upon arrival home, such as soup and crackers or toast.  Be sure to drink plenty of fluids.  Resume your normal diet the day after surgery.  Most patients will experience some swelling and bruising on the chest and neck area.  Ice packs will help for the first 48 hours after arriving home.  Swelling and bruising will take several days to resolve.   It is common to experience some constipation after surgery.  Increasing fluid intake and taking a stool softener (Colace) will usually help to prevent this problem.  A mild laxative (Milk of Magnesia or Miralax) should be taken according to package directions if there has been no bowel movement after 48 hours.  Dermabond glue covers your incision. This seals the wound and you may shower at any time. The Dermabond will remain in place for about a week.  You may gradually remove the glue when it loosens around the edges.  If you need to loosen the Dermabond for removal, apply a layer of Vaseline to the wound for 15 minutes and then remove with a Kleenex. Your sutures are under the skin and will not show - they will dissolve on their own.  You may resume light daily activities beginning the day after discharge (such as self-care,  walking, climbing stairs), gradually increasing activities as tolerated. You may have sexual intercourse when it is comfortable. Refrain from any heavy lifting or straining until approved by your doctor. You may drive when you no longer are taking prescription pain medication, you can comfortably wear a seatbelt, and you can safely maneuver your car and apply the brakes.  You will see your doctor in the office for a follow-up appointment approximately three weeks after your surgery.  Make sure that you call for this appointment within a day or two after you arrive home to insure a convenient appointment time. Please have any requested laboratory tests performed a few days prior to your office visit so that the results will be available at your follow up appointment.  WHEN TO CALL THE [facility] OFFICE: -- Fever greater than 101.5 -- Inability to urinate -- Nausea and/or vomiting - persistent -- Extreme swelling or bruising -- Continued bleeding from incision -- Increased pain, redness, or drainage from the incision -- Difficulty swallowing or breathing -- Muscle cramping or spasms -- Numbness or tingling in hands or around lips  The clinic staff is available to answer your questions during regular business hours.  Please don't hesitate to call and ask to speak to one of the nurses if you have concerns.  [facility] OFFICE: [phone] (24 hours)  Please sign up for MyChart accounts. This will allow you to communicate directly with my nurse or myself without having to call the office. It will also allow you   to view your test results. You will need to enroll in MyChart for my office ([facility]) and for the hospital ([place]).  Todd Gerkin, MD [facility] A [facility] practice 

## 2023-01-23 ENCOUNTER — Encounter (HOSPITAL_COMMUNITY): Payer: Self-pay | Admitting: Surgery

## 2023-01-23 LAB — SURGICAL PATHOLOGY

## 2023-02-09 ENCOUNTER — Ambulatory Visit: Payer: Medicare HMO | Admitting: Family Medicine

## 2023-02-12 DIAGNOSIS — E210 Primary hyperparathyroidism: Secondary | ICD-10-CM | POA: Diagnosis not present

## 2023-02-12 DIAGNOSIS — E892 Postprocedural hypoparathyroidism: Secondary | ICD-10-CM | POA: Diagnosis not present

## 2023-02-23 ENCOUNTER — Encounter: Payer: Self-pay | Admitting: Family Medicine

## 2023-02-23 ENCOUNTER — Ambulatory Visit (INDEPENDENT_AMBULATORY_CARE_PROVIDER_SITE_OTHER): Payer: Medicare HMO | Admitting: Family Medicine

## 2023-02-23 VITALS — BP 160/70 | HR 61 | Ht 65.0 in | Wt 151.0 lb

## 2023-02-23 DIAGNOSIS — I10 Essential (primary) hypertension: Secondary | ICD-10-CM | POA: Insufficient documentation

## 2023-02-23 DIAGNOSIS — L0231 Cutaneous abscess of buttock: Secondary | ICD-10-CM | POA: Diagnosis not present

## 2023-02-23 NOTE — Patient Instructions (Signed)
It was nice to meet you today,   You have a small abscess on your buttock that appears to have already started draining.    To help this, please do the following:   -wet a washcloth with hot water and wring it out so it is damp and apply that to the area for 15 minutes 4 times a day.    - if you feel like the pain is worse by Friday, please go to the urgent care on [address] to get it drained if needed.    - if you develop fevers or feel sick prior to that time, please go to the urgent care sooner.    Have a great day,    Dr. Jeannine Kitten

## 2023-02-23 NOTE — Assessment & Plan Note (Signed)
Elevated today.  Advised to f/u for repeat and treatment if necessary

## 2023-02-23 NOTE — Progress Notes (Signed)
   Acute Office Visit  Subjective:     Patient ID: Ronald Kelley, male    DOB: 05/18/1941, 82 y.o.   MRN: [id number]  Chief Complaint  Patient presents with   spot on buttock    HPI Patient is in today for painful lump on his buttock for the past 2 weeks.  States it is between the "groin and buttock.  He is unable to see it even with a mirror.  Can feel it.  It is painful to sit on.  No fevers.  No nausea vomiting.   ROS      Objective:    BP (!) 160/70   Pulse 61   Ht 5\' 5"  (1.651 m)   Wt 151 lb (68.5 kg)   SpO2 99% Comment: on RA  BMI 25.13 kg/m    Physical Exam  No results found for any visits on 02/23/23.  Gen: alert, oriented Skin: small, 1.5cm in diameter abscess with mild ring of erythema. No induration. Appears to express small amount of fluid.  No bleeding.      Assessment & Plan:   Problem List Items Addressed This Visit       Cardiovascular and Mediastinum   Hypertension    Elevated today.  Advised to f/u for repeat and treatment if necessary        Musculoskeletal and Integument   Cutaneous abscess of buttock - Primary    Small localized skin abscess near the gluteal fold.  Appears to already be draining.  No systemic symptoms.  Advised to use hot compresses 4xd and if it is not improving by Friday of this week to go to the urgent care for drainage.  No abx at this time.         No orders of the defined types were placed in this encounter.   Return schedule a well visit with me at earliest convenience.  Benay Pike, MD

## 2023-02-23 NOTE — Assessment & Plan Note (Signed)
Small localized skin abscess near the gluteal fold.  Appears to already be draining.  No systemic symptoms.  Advised to use hot compresses 4xd and if it is not improving by Friday of this week to go to the urgent care for drainage.  No abx at this time.

## 2023-02-24 DIAGNOSIS — M7532 Calcific tendinitis of left shoulder: Secondary | ICD-10-CM | POA: Diagnosis not present

## 2023-03-13 ENCOUNTER — Other Ambulatory Visit: Payer: Self-pay | Admitting: Nurse Practitioner

## 2023-03-13 DIAGNOSIS — R7989 Other specified abnormal findings of blood chemistry: Secondary | ICD-10-CM

## 2023-03-16 DIAGNOSIS — M7532 Calcific tendinitis of left shoulder: Secondary | ICD-10-CM | POA: Diagnosis not present

## 2023-03-19 ENCOUNTER — Telehealth: Payer: Self-pay

## 2023-03-19 NOTE — Telephone Encounter (Signed)
Called patient to schedule Medicare Annual Wellness Visit (AWV). Unable to reach patient.  Last date of AWV: 01/03/21  Please schedule an appointment at any time on Annual Wellness Visit Schedule.

## 2023-03-24 ENCOUNTER — Ambulatory Visit: Payer: Medicare HMO | Admitting: Family Medicine

## 2023-03-26 ENCOUNTER — Encounter: Payer: Self-pay | Admitting: Family Medicine

## 2023-03-26 ENCOUNTER — Ambulatory Visit (INDEPENDENT_AMBULATORY_CARE_PROVIDER_SITE_OTHER): Payer: Medicare HMO | Admitting: Family Medicine

## 2023-03-26 VITALS — BP 188/84 | HR 74 | Resp 18 | Ht 65.0 in | Wt 157.0 lb

## 2023-03-26 DIAGNOSIS — E119 Type 2 diabetes mellitus without complications: Secondary | ICD-10-CM

## 2023-03-26 DIAGNOSIS — I951 Orthostatic hypotension: Secondary | ICD-10-CM

## 2023-03-26 DIAGNOSIS — I10 Essential (primary) hypertension: Secondary | ICD-10-CM

## 2023-03-26 DIAGNOSIS — E210 Primary hyperparathyroidism: Secondary | ICD-10-CM

## 2023-03-26 LAB — POCT UA - MICROALBUMIN
Albumin/Creatinine Ratio, Urine, POC: <30
Creatinine, POC: 100 mg/dL
Microalbumin Ur, POC: 80 mg/L

## 2023-03-26 NOTE — Assessment & Plan Note (Signed)
No calcium levels I can find in his chart since his surgery for parathyroid removal. Follow-up CMP

## 2023-03-26 NOTE — Assessment & Plan Note (Signed)
Patient's blood pressure again elevated.  He continues to decline medications.  Advised him to reduce salt intake.  Discussed risks of uncontrolled blood pressure

## 2023-03-26 NOTE — Patient Instructions (Signed)
I will fill out your paperwork and send it in for you.  They may or may not approve it based on the answers.  I will get some more lab work for you today.  I will call you and let you know the results when I get it.  Your blood pressure was elevated.  We discussed starting medication but he would like to hold off on that at this point.  I would suggest limiting your salt intake if you choose not to take blood pressure medication.  Have a great day,  Frederic Jericho, MD

## 2023-03-26 NOTE — Assessment & Plan Note (Signed)
Rechecking A1c today Filling out form for CGM.

## 2023-03-26 NOTE — Progress Notes (Signed)
   Established Patient Office Visit  Subjective   Patient ID: Ronald Kelley, male    DOB: 05-Sep-1941  Age: 82 y.o. MRN: [id number]  Chief Complaint  Patient presents with   Diabetes    HTN patient's blood pressure again was elevated today.  He discussed what he ate for breakfast.  States that he put salt on his eggs among other foods.  Patient does not want to start any new medications unless absolutely necessary and does not want to start a blood pressure medicine..  We discussed limiting his salt intake.  DM2 -discussed with patient the uses for continuous glucose monitoring and how they work.  Patient understands that it might not be excepted by insurance.  Continues to manage his diabetes with diet control.       ROS    Objective:     BP (!) 188/84 (BP Location: Right Arm, Patient Position: Sitting, Cuff Size: Normal)   Pulse 74   Resp 18   Ht  (1.651 m)   Wt 157 lb (71.2 kg)   SpO2 99%   BMI 26.13 kg/m    Physical Exam General: Alert and oriented CV: Regular rate Pulmonary: No respiratory distress Psych: Pleasant affect.   Results for orders placed or performed in visit on 03/26/23  POCT UA - Microalbumin  Result Value Ref Range   Microalbumin Ur, POC 80 mg/L   Creatinine, POC 100 mg/dL   Albumin/Creatinine Ratio, Urine, POC <30       The ASCVD Risk score (Arnett DK, et al., 2019) failed to calculate for the following reasons:   The 2019 ASCVD risk score is only valid for ages 18 to 60    Assessment & Plan:   Problem List Items Addressed This Visit       Cardiovascular and Mediastinum   Orthostatic hypotension - Primary     Endocrine   Hyperparathyroidism, primary (Chronic)   Relevant Orders   TSH   Comp Met (CMET)   Other Visit Diagnoses     Diabetes mellitus without complication       Relevant Orders   POCT UA - Microalbumin (Completed)   HgB A1c   Urine Microalbumin w/creat. ratio       Return in about 3 months (around  06/25/2023) for HTN, DM.    Ronald Kitty, MD

## 2023-03-27 LAB — COMPREHENSIVE METABOLIC PANEL
ALT: 28 IU/L (ref 0–44)
AST: 23 IU/L (ref 0–40)
Albumin/Globulin Ratio: 1.8 (ref 1.2–2.2)
Albumin: 4.1 g/dL (ref 3.7–4.7)
Alkaline Phosphatase: 101 IU/L (ref 44–121)
BUN/Creatinine Ratio: 21 (ref 10–24)
BUN: 22 mg/dL (ref 8–27)
Bilirubin Total: 1 mg/dL (ref 0.0–1.2)
CO2: 24 mmol/L (ref 20–29)
Calcium: 9.2 mg/dL (ref 8.6–10.2)
Chloride: 101 mmol/L (ref 96–106)
Creatinine, Ser: 1.05 mg/dL (ref 0.76–1.27)
Globulin, Total: 2.3 g/dL (ref 1.5–4.5)
Glucose: 191 mg/dL — ABNORMAL HIGH (ref 70–99)
Potassium: 4.2 mmol/L (ref 3.5–5.2)
Sodium: 141 mmol/L (ref 134–144)
Total Protein: 6.4 g/dL (ref 6.0–8.5)
eGFR: 70 mL/min/{1.73_m2} (ref >59)

## 2023-03-27 LAB — MICROALBUMIN / CREATININE URINE RATIO
Creatinine, Urine: 111.4 mg/dL
Microalb/Creat Ratio: 20 mg/g creat (ref 0–29)
Microalbumin, Urine: 22.8 ug/mL

## 2023-03-27 LAB — HEMOGLOBIN A1C
Est. average glucose Bld gHb Est-mCnc: 169 mg/dL
Hgb A1c MFr Bld: 7.5 % — ABNORMAL HIGH (ref 4.8–5.6)

## 2023-03-27 LAB — TSH: TSH: 1.06 u[IU]/mL (ref 0.450–4.500)

## 2023-03-31 DIAGNOSIS — E119 Type 2 diabetes mellitus without complications: Secondary | ICD-10-CM | POA: Diagnosis not present

## 2023-04-03 DIAGNOSIS — M25512 Pain in left shoulder: Secondary | ICD-10-CM | POA: Diagnosis not present

## 2023-04-06 DIAGNOSIS — M25512 Pain in left shoulder: Secondary | ICD-10-CM | POA: Diagnosis not present

## 2023-04-15 DIAGNOSIS — M25512 Pain in left shoulder: Secondary | ICD-10-CM | POA: Diagnosis not present

## 2023-04-17 DIAGNOSIS — M25512 Pain in left shoulder: Secondary | ICD-10-CM | POA: Diagnosis not present

## 2023-04-22 DIAGNOSIS — M25512 Pain in left shoulder: Secondary | ICD-10-CM | POA: Diagnosis not present

## 2023-04-24 DIAGNOSIS — M25512 Pain in left shoulder: Secondary | ICD-10-CM | POA: Diagnosis not present

## 2023-04-28 DIAGNOSIS — M7532 Calcific tendinitis of left shoulder: Secondary | ICD-10-CM | POA: Diagnosis not present

## 2023-04-30 DIAGNOSIS — E119 Type 2 diabetes mellitus without complications: Secondary | ICD-10-CM | POA: Diagnosis not present

## 2023-05-05 ENCOUNTER — Encounter: Payer: Self-pay | Admitting: Family Medicine

## 2023-05-05 ENCOUNTER — Ambulatory Visit (INDEPENDENT_AMBULATORY_CARE_PROVIDER_SITE_OTHER): Payer: Medicare HMO | Admitting: Family Medicine

## 2023-05-05 VITALS — BP 158/84 | HR 55 | Resp 18 | Ht 65.0 in | Wt 155.0 lb

## 2023-05-05 DIAGNOSIS — I10 Essential (primary) hypertension: Secondary | ICD-10-CM

## 2023-05-05 DIAGNOSIS — M542 Cervicalgia: Secondary | ICD-10-CM | POA: Diagnosis not present

## 2023-05-05 DIAGNOSIS — R3581 Nocturnal polyuria: Secondary | ICD-10-CM | POA: Diagnosis not present

## 2023-05-05 LAB — POCT URINALYSIS DIPSTICK
Bilirubin, UA: NEGATIVE
Blood, UA: NEGATIVE
Glucose, UA: POSITIVE — AB
Ketones, UA: NEGATIVE
Leukocytes, UA: NEGATIVE
Nitrite, UA: NEGATIVE
Protein, UA: POSITIVE — AB
Spec Grav, UA: 1.025 (ref 1.010–1.025)
Urobilinogen, UA: 0.2 E.U./dL
pH, UA: 6.5 (ref 5.0–8.0)

## 2023-05-05 NOTE — Progress Notes (Signed)
Acute Office Visit  Subjective:     Patient ID: Ronald Kelley, male    DOB: 22-Apr-1941, 82 y.o.   MRN: [id number]  Chief Complaint  Patient presents with   polynocturia    HPI Patient is in today for increased urinary frequency at night.  He states that typically, he would need to get up 2-3 times each night to urinate, but recently has increased to 6-8 times each night.  He denies dysuria, urgency, abdominal pain, fever, chills.  ROS Negative unless otherwise noted in HPI    Objective:    BP (!) 158/84 (BP Location: Left Arm, Patient Position: Sitting, Cuff Size: Normal)   Pulse (!) 55   Resp 18   Ht 5\' 5"  (1.651 m)   Wt 155 lb (70.3 kg)   SpO2 96%   BMI 25.79 kg/m   Physical Exam Constitutional:      General: He is not in acute distress.    Appearance: Normal appearance.  HENT:     Head: Normocephalic and atraumatic.  Cardiovascular:     Rate and Rhythm: Normal rate and regular rhythm.     Pulses: Normal pulses.     Heart sounds: Normal heart sounds. No murmur heard.    No friction rub. No gallop.  Pulmonary:     Effort: Pulmonary effort is normal. No respiratory distress.     Breath sounds: Normal breath sounds. No wheezing, rhonchi or rales.  Abdominal:     General: Abdomen is flat.     Tenderness: There is no guarding.  Skin:    General: Skin is warm and dry.  Neurological:     Mental Status: He is alert and oriented to person, place, and time.  Psychiatric:        Mood and Affect: Mood normal.    Results for orders placed or performed in visit on 05/05/23  POCT Urinalysis Dipstick  Result Value Ref Range   Color, UA Yellow    Clarity, UA Clear    Glucose, UA Positive (A) Negative   Bilirubin, UA Negative    Ketones, UA Negative    Spec Grav, UA 1.025 1.010 - 1.025   Blood, UA Negative    pH, UA 6.5 5.0 - 8.0   Protein, UA Positive (A) Negative   Urobilinogen, UA 0.2 0.2 or 1.0 E.U./dL   Nitrite, UA Negative    Leukocytes, UA Negative  Negative   Appearance     Odor       Assessment & Plan:  Nocturnal polyuria Assessment & Plan: Patient has a history of BPH associated with nocturia in 2017, recent changes include increased episodes of nocturia.  PSA on 08/19/2022 was 3.4, repeating PSA today.  Provided ambulatory referral to Dr. Jerilee Field at [facility] in [place] per patient request.  Orders: -     POCT urinalysis dipstick -     PSA -     Ambulatory referral to Urology  Primary hypertension Assessment & Plan: BP elevated 160/81 initially, on repeat remained elevated.  He does have a family history of hypertension, he states his mother had it for most of her life.  We discussed that he may be genetically predisposed to high blood pressure, regardless it is important to keep his blood pressure lower than it has been to reduce risk of eye damage, kidney damage, stroke.  In our discussion, patient was possibly open to starting medication but did make it clear that he wants to be on only medications  that are absolutely necessary.  I assured him that we will only make recommendations that are necessary and we will keep him at the lowest possible effective dose.  He would like to discuss this further with his PCP, Dr. Constance Goltz, and is scheduling a follow-up appointment to do so.     Return in about 1 day (around 05/06/2023) for PSA bloodwork; 2 weeks for follow up with Dr. Constance Goltz for HTN.  Melida Quitter, PA

## 2023-05-05 NOTE — Assessment & Plan Note (Signed)
Patient has a history of BPH associated with nocturia in 2017, recent changes include increased episodes of nocturia.  PSA on 08/19/2022 was 3.4, repeating PSA today.  Provided ambulatory referral to Dr. Jerilee Field at [facility] in [place] per patient request.

## 2023-05-05 NOTE — Assessment & Plan Note (Addendum)
BP elevated 160/81 initially, on repeat remained elevated.  He does have a family history of hypertension, he states his mother had it for most of her life.  We discussed that he may be genetically predisposed to high blood pressure, regardless it is important to keep his blood pressure lower than it has been to reduce risk of eye damage, kidney damage, stroke.  In our discussion, patient was possibly open to starting medication but did make it clear that he wants to be on only medications that are absolutely necessary.  I assured him that we will only make recommendations that are necessary and we will keep him at the lowest possible effective dose.  He would like to discuss this further with his PCP, Dr. Constance Goltz, and is scheduling a follow-up appointment to do so.

## 2023-05-06 ENCOUNTER — Other Ambulatory Visit: Payer: Medicare HMO

## 2023-05-06 DIAGNOSIS — R3581 Nocturnal polyuria: Secondary | ICD-10-CM | POA: Diagnosis not present

## 2023-05-06 DIAGNOSIS — R351 Nocturia: Secondary | ICD-10-CM | POA: Diagnosis not present

## 2023-05-06 DIAGNOSIS — R972 Elevated prostate specific antigen [PSA]: Secondary | ICD-10-CM | POA: Diagnosis not present

## 2023-05-06 DIAGNOSIS — R350 Frequency of micturition: Secondary | ICD-10-CM | POA: Diagnosis not present

## 2023-05-06 NOTE — Addendum Note (Signed)
Addended by: Saralyn Pilar on: 05/06/2023 10:12 AM   Modules accepted: Orders

## 2023-05-08 LAB — PSA: Prostate Specific Ag, Serum: 3.9 ng/mL (ref 0.0–4.0)

## 2023-05-11 DIAGNOSIS — M25512 Pain in left shoulder: Secondary | ICD-10-CM | POA: Diagnosis not present

## 2023-05-12 ENCOUNTER — Ambulatory Visit (INDEPENDENT_AMBULATORY_CARE_PROVIDER_SITE_OTHER): Payer: Medicare HMO

## 2023-05-12 VITALS — Ht 65.5 in | Wt 155.0 lb

## 2023-05-12 DIAGNOSIS — Z0000 Encounter for general adult medical examination without abnormal findings: Secondary | ICD-10-CM

## 2023-05-12 NOTE — Patient Instructions (Addendum)
[name] , Thank you for taking time to come for your Medicare Wellness Visit. I appreciate your ongoing commitment to your health goals. Please review the following plan we discussed and let me know if I can assist you in the future.   These are the goals we discussed:  Goals       Stay Healthy (pt-stated)        This is a list of the screening recommended for you and due dates:  Health Maintenance  Topic Date Due   COVID-19 Vaccine (1) Never done   Zoster (Shingles) Vaccine (1 of 2) Never done   Pneumonia Vaccine (2 of 2 - PCV) 06/23/2009   DTaP/Tdap/Td vaccine (2 - Tdap) 06/23/2018   Flu Shot  07/02/2023   Hemoglobin A1C  09/25/2023   Yearly kidney function blood test for diabetes  03/25/2024   Yearly kidney health urinalysis for diabetes  03/25/2024   Medicare Annual Wellness Visit  05/11/2024   HPV Vaccine  Aged Out   Complete foot exam   Discontinued   Eye exam for diabetics  Discontinued  Opioid Pain Medicine Management Opioids are powerful medicines that are used to treat moderate to severe pain. When used for short periods of time, they can help you to: Sleep better. Do better in physical or occupational therapy. Feel better in the first few days after an injury. Recover from surgery. Opioids should be taken with the supervision of a trained health care provider. They should be taken for the shortest period of time possible. This is because opioids can be addictive, and the longer you take opioids, the greater your risk of addiction. This addiction can also be called opioid use disorder. What are the risks? Using opioid pain medicines for longer than 3 days increases your risk of side effects. Side effects include: Constipation. Nausea and vomiting. Breathing difficulties (respiratory depression). Drowsiness. Confusion. Opioid use disorder. Itching. Taking opioid pain medicine for a long period of time can affect your ability to do daily tasks. It also puts you at  risk for: Motor vehicle crashes. Depression. Suicide. Heart attack. Overdose, which can be life-threatening. What is a pain treatment plan? A pain treatment plan is an agreement between you and your health care provider. Pain is unique to each person, and treatments vary depending on your condition. To manage your pain, you and your health care provider need to work together. To help you do this: Discuss the goals of your treatment, including how much pain you might expect to have and how you will manage the pain. Review the risks and benefits of taking opioid medicines. Remember that a good treatment plan uses more than one approach and minimizes the chance of side effects. Be honest about the amount of medicines you take and about any drug or alcohol use. Get pain medicine prescriptions from only one health care provider. Pain can be managed with many types of alternative treatments. Ask your health care provider to refer you to one or more specialists who can help you manage pain through: Physical or occupational therapy. Counseling (cognitive behavioral therapy). Good nutrition. Biofeedback. Massage. Meditation. Non-opioid medicine. Following a gentle exercise program. How to use opioid pain medicine Taking medicine Take your pain medicine exactly as told by your health care provider. Take it only when you need it. If your pain gets less severe, you may take less than your prescribed dose if your health care provider approves. If you are not having pain, do nottake pain medicine  unless your health care provider tells you to take it. If your pain is severe, do nottry to treat it yourself by taking more pills than instructed on your prescription. Contact your health care provider for help. Write down the times when you take your pain medicine. It is easy to become confused while on pain medicine. Writing the time can help you avoid overdose. Take other over-the-counter or prescription  medicines only as told by your health care provider. Keeping yourself and others safe  While you are taking opioid pain medicine: Do not drive, use machinery, or power tools. Do not sign legal documents. Do not drink alcohol. Do not take sleeping pills. Do not supervise children by yourself. Do not do activities that require climbing or being in high places. Do not go to a lake, river, ocean, spa, or swimming pool. Do not share your pain medicine with anyone. Keep pain medicine in a locked cabinet or in a secure area where pets and children cannot reach it. Stopping your use of opioids If you have been taking opioid medicine for more than a few weeks, you may need to slowly decrease (taper) how much you take until you stop completely. Tapering your use of opioids can decrease your risk of symptoms of withdrawal, such as: Pain and cramping in the abdomen. Nausea. Sweating. Sleepiness. Restlessness. Uncontrollable shaking (tremors). Cravings for the medicine. Do not attempt to taper your use of opioids on your own. Talk with your health care provider about how to do this. Your health care provider may prescribe a step-down schedule based on how much medicine you are taking and how long you have been taking it. Getting rid of leftover pills Do not save any leftover pills. Get rid of leftover pills safely by: Taking the medicine to a prescription take-back program. This is usually offered by the county or law enforcement. Bringing them to a pharmacy that has a drug disposal container. Flushing them down the toilet. Check the label or package insert of your medicine to see whether this is safe to do. Throwing them out in the trash. Check the label or package insert of your medicine to see whether this is safe to do. If it is safe to throw it out, remove the medicine from the original container, put it into a sealable bag or container, and mix it with used coffee grounds, food scraps, dirt, or  cat litter before putting it in the trash. Follow these instructions at home: Activity Do exercises as told by your health care provider. Avoid activities that make your pain worse. Return to your normal activities as told by your health care provider. Ask your health care provider what activities are safe for you. General instructions You may need to take these actions to prevent or treat constipation: Drink enough fluid to keep your urine pale yellow. Take over-the-counter or prescription medicines. Eat foods that are high in fiber, such as beans, whole grains, and fresh fruits and vegetables. Limit foods that are high in fat and processed sugars, such as fried or sweet foods. Keep all follow-up visits. This is important. Where to find support If you have been taking opioids for a long time, you may benefit from receiving support for quitting from a local support group or counselor. Ask your health care provider for a referral to these resources in your area. Where to find more information Centers for Disease Control and Prevention (CDC): FootballExhibition.com.br [place] Food and Drug Administration (FDA): PumpkinSearch.com.ee Get help right away  if: You may have taken too much of an opioid (overdosed). Common symptoms of an overdose: Your breathing is slower or more shallow than normal. You have a very slow heartbeat (pulse). You have slurred speech. You have nausea and vomiting. Your pupils become very small. You have other potential symptoms: You are very confused. You faint or feel like you will faint. You have cold, clammy skin. You have blue lips or fingernails. You have thoughts of harming yourself or harming others. These symptoms may represent a serious problem that is an emergency. Do not wait to see if the symptoms will go away. Get medical help right away. Call your local emergency services (911 in the [place] Do not drive yourself to the hospital.  If you ever feel like you may hurt yourself or  others, or have thoughts about taking your own life, get help right away. Go to your nearest emergency department or: Call your local emergency services (911 in the [place] Call the [facility] ((409)630-0616 in the [place] Call a suicide crisis helpline, such as the National Suicide Prevention Lifeline at 971-732-0856 or 988 in the [place] This is open 24 hours a day in the [place] Text the Crisis Text Line at 985-091-9144 (in the [place] Summary Opioid medicines can help you manage moderate to severe pain for a short period of time. A pain treatment plan is an agreement between you and your health care provider. Discuss the goals of your treatment, including how much pain you might expect to have and how you will manage the pain. If you think that you or someone else may have taken too much of an opioid, get medical help right away. This information is not intended to replace advice given to you by your health care provider. Make sure you discuss any questions you have with your health care provider. Document Revised: 06/12/2021 Document Reviewed: 02/27/2021 Elsevier Patient Education  2024 [employer]   Advanced directives: Please bring a copy of your health care power of attorney and living will to the office to be added to your chart at your convenience.   Conditions/risks identified: None  Next appointment: Follow up in one year for your annual wellness visit.   Preventive Care 36 Years and Older, Male  Preventive care refers to lifestyle choices and visits with your health care provider that can promote health and wellness. What does preventive care include? A yearly physical exam. This is also called an annual well check. Dental exams once or twice a year. Routine eye exams. Ask your health care provider how often you should have your eyes checked. Personal lifestyle choices, including: Daily care of your teeth and gums. Regular physical activity. Eating a healthy  diet. Avoiding tobacco and drug use. Limiting alcohol use. Practicing safe sex. Taking low doses of aspirin every day. Taking vitamin and mineral supplements as recommended by your health care provider. What happens during an annual well check? The services and screenings done by your health care provider during your annual well check will depend on your age, overall health, lifestyle risk factors, and family history of disease. Counseling  Your health care provider may ask you questions about your: Alcohol use. Tobacco use. Drug use. Emotional well-being. Home and relationship well-being. Sexual activity. Eating habits. History of falls. Memory and ability to understand (cognition). Work and work Astronomer. Screening  You may have the following tests or measurements: Height, weight, and BMI. Blood pressure. Lipid and cholesterol levels. These may be checked  every 5 years, or more frequently if you are over 38 years old. Skin check. Lung cancer screening. You may have this screening every year starting at age 11 if you have a 30-pack-year history of smoking and currently smoke or have quit within the past 15 years. Fecal occult blood test (FOBT) of the stool. You may have this test every year starting at age 27. Flexible sigmoidoscopy or colonoscopy. You may have a sigmoidoscopy every 5 years or a colonoscopy every 10 years starting at age 8. Prostate cancer screening. Recommendations will vary depending on your family history and other risks. Hepatitis C blood test. Hepatitis B blood test. Sexually transmitted disease (STD) testing. Diabetes screening. This is done by checking your blood sugar (glucose) after you have not eaten for a while (fasting). You may have this done every 1-3 years. Abdominal aortic aneurysm (AAA) screening. You may need this if you are a current or former smoker. Osteoporosis. You may be screened starting at age 70 if you are at high risk. Talk with  your health care provider about your test results, treatment options, and if necessary, the need for more tests. Vaccines  Your health care provider may recommend certain vaccines, such as: Influenza vaccine. This is recommended every year. Tetanus, diphtheria, and acellular pertussis (Tdap, Td) vaccine. You may need a Td booster every 10 years. Zoster vaccine. You may need this after age 57. Pneumococcal 13-valent conjugate (PCV13) vaccine. One dose is recommended after age 34. Pneumococcal polysaccharide (PPSV23) vaccine. One dose is recommended after age 90. Talk to your health care provider about which screenings and vaccines you need and how often you need them. This information is not intended to replace advice given to you by your health care provider. Make sure you discuss any questions you have with your health care provider. Document Released: 12/14/2015 Document Revised: 08/06/2016 Document Reviewed: 09/18/2015 Elsevier Interactive Patient Education  2017 [employer].  Fall Prevention in the Home Falls can cause injuries. They can happen to people of all ages. There are many things you can do to make your home safe and to help prevent falls. What can I do on the outside of my home? Regularly fix the edges of walkways and driveways and fix any cracks. Remove anything that might make you trip as you walk through a door, such as a raised step or threshold. Trim any bushes or trees on the path to your home. Use bright outdoor lighting. Clear any walking paths of anything that might make someone trip, such as rocks or tools. Regularly check to see if handrails are loose or broken. Make sure that both sides of any steps have handrails. Any raised decks and porches should have guardrails on the edges. Have any leaves, snow, or ice cleared regularly. Use sand or salt on walking paths during winter. Clean up any spills in your garage right away. This includes oil or grease spills. What  can I do in the bathroom? Use night lights. Install grab bars by the toilet and in the tub and shower. Do not use towel bars as grab bars. Use non-skid mats or decals in the tub or shower. If you need to sit down in the shower, use a plastic, non-slip stool. Keep the floor dry. Clean up any water that spills on the floor as soon as it happens. Remove soap buildup in the tub or shower regularly. Attach bath mats securely with double-sided non-slip rug tape. Do not have throw rugs and other things  on the floor that can make you trip. What can I do in the bedroom? Use night lights. Make sure that you have a light by your bed that is easy to reach. Do not use any sheets or blankets that are too big for your bed. They should not hang down onto the floor. Have a firm chair that has side arms. You can use this for support while you get dressed. Do not have throw rugs and other things on the floor that can make you trip. What can I do in the kitchen? Clean up any spills right away. Avoid walking on wet floors. Keep items that you use a lot in easy-to-reach places. If you need to reach something above you, use a strong step stool that has a grab bar. Keep electrical cords out of the way. Do not use floor polish or wax that makes floors slippery. If you must use wax, use non-skid floor wax. Do not have throw rugs and other things on the floor that can make you trip. What can I do with my stairs? Do not leave any items on the stairs. Make sure that there are handrails on both sides of the stairs and use them. Fix handrails that are broken or loose. Make sure that handrails are as long as the stairways. Check any carpeting to make sure that it is firmly attached to the stairs. Fix any carpet that is loose or worn. Avoid having throw rugs at the top or bottom of the stairs. If you do have throw rugs, attach them to the floor with carpet tape. Make sure that you have a light switch at the top of the  stairs and the bottom of the stairs. If you do not have them, ask someone to add them for you. What else can I do to help prevent falls? Wear shoes that: Do not have high heels. Have rubber bottoms. Are comfortable and fit you well. Are closed at the toe. Do not wear sandals. If you use a stepladder: Make sure that it is fully opened. Do not climb a closed stepladder. Make sure that both sides of the stepladder are locked into place. Ask someone to hold it for you, if possible. Clearly mark and make sure that you can see: Any grab bars or handrails. First and last steps. Where the edge of each step is. Use tools that help you move around (mobility aids) if they are needed. These include: Canes. Walkers. Scooters. Crutches. Turn on the lights when you go into a dark area. Replace any light bulbs as soon as they burn out. Set up your furniture so you have a clear path. Avoid moving your furniture around. If any of your floors are uneven, fix them. If there are any pets around you, be aware of where they are. Review your medicines with your doctor. Some medicines can make you feel dizzy. This can increase your chance of falling. Ask your doctor what other things that you can do to help prevent falls. This information is not intended to replace advice given to you by your health care provider. Make sure you discuss any questions you have with your health care provider. Document Released: 09/13/2009 Document Revised: 04/24/2016 Document Reviewed: 12/22/2014 Elsevier Interactive Patient Education  2017 [employer].

## 2023-05-12 NOTE — Progress Notes (Signed)
Subjective:   Ronald Kelley is a 82 y.o. male who presents for Medicare Annual/Subsequent preventive examination.  Review of Systems    Virtual Visit via Telephone Note  I connected with  Ronald Kelley on 05/12/23 at  3:00 PM EDT by telephone and verified that I am speaking with the correct person using two identifiers.  Location: Patient: Home Provider: Office Persons participating in the virtual visit: patient/Nurse Health Advisor   I discussed the limitations, risks, security and privacy concerns of performing an evaluation and management service by telephone and the availability of in person appointments. The patient expressed understanding and agreed to proceed.  Interactive audio and video telecommunications were attempted between this nurse and patient, however failed, due to patient having technical difficulties OR patient did not have access to video capability.  We continued and completed visit with audio only.  Some vital signs may be absent or patient reported.   Tillie Rung, LPN  Cardiac Risk Factors include: advanced age (>27men, >50 women);male gender;hypertension     Objective:    Today's Vitals   05/12/23 1504  Weight: 155 lb (70.3 kg)  Height: 5' 5.5" (1.664 m)   Body mass index is 25.4 kg/m.     05/12/2023    3:13 PM 01/22/2023    6:36 AM 01/12/2023    8:17 AM 08/31/2019    9:45 AM 08/01/2017    5:00 AM 07/31/2017   10:46 AM 09/24/2016    1:39 PM  Advanced Directives  Does Patient Have a Medical Advance Directive? Yes No No No No No Yes  Type of Estate agent of [place];Living will      Healthcare Power of Attorney  Does patient want to make changes to medical advance directive?       No - Patient declined  Copy of Healthcare Power of Attorney in Chart? No - copy requested      No - copy requested  Would patient like information on creating a medical advance directive?  No - Patient declined No - Patient declined   Yes (ED -  Information included in AVS)     Current Medications (verified) Outpatient Encounter Medications as of 05/12/2023  Medication Sig   Ascorbic Acid (VITAMIN C) 100 MG tablet Take 100 mg by mouth daily.   Cholecalciferol (VITAMIN D3) 125 MCG (5000 UT) TABS 5,000 IU OTC vitamin D3 daily.   levothyroxine (SYNTHROID) 50 MCG tablet TAKE 1 TABLET BY MOUTH EVERY DAY BEFORE BREAKFAST   timolol (TIMOPTIC) 0.5 % ophthalmic solution Place 1 drop into both eyes daily.   traMADol (ULTRAM) 50 MG tablet Take 1 tablet (50 mg total) by mouth every 6 (six) hours as needed for moderate pain.   Zinc Sulfate (ZINC 15 PO) Take 1 tablet by mouth daily.   No facility-administered encounter medications on file as of 05/12/2023.    Allergies (verified) Hydrocodone-acetaminophen, Oxycodone, and Oxycodone-acetaminophen   History: Past Medical History:  Diagnosis Date   Atrial fib/flutter, transient (HCC)    Degenerative disk disease    Diabetes mellitus    a1c 6.5, 07/2008   GERD (gastroesophageal reflux disease)    History of inguinal hernia repair, bilateral 03/23/2019   History of kidney stones    History of vitamin D deficiency 09/27/2016   Hyperthyroidism    remote h/o , no ablation   OSA on CPAP    Past Surgical History:  Procedure Laterality Date   HERNIA REPAIR  2010   L5 acute  HNP     s/p surgery 09/21/08-- still has occasional paretheisa of the left foot    PARATHYROIDECTOMY Right 01/22/2023   Procedure: RIGHT INFERIOR PARATHYROIDECTOMY;  Surgeon: Darnell Level, MD;  Location: [facility];  Service: General;  Laterality: Right;   ROTATOR CUFF REPAIR     Family History  Problem Relation Age of Onset   Hypertension Mother    Diabetes Brother    Alzheimer's disease Father 84   Coronary artery disease Neg Hx    Stroke Neg Hx    Colon cancer Neg Hx    Prostate cancer Neg Hx    Social History   Socioeconomic History   Marital status: Married    Spouse name: Not on file   Number of children: 3    Years of education: Not on file   Highest education level: Not on file  Occupational History   Occupation: Water quality scientist-- semi-retirement  Tobacco Use   Smoking status: Never    Passive exposure: Never   Smokeless tobacco: Never  Vaping Use   Vaping Use: Never used  Substance and Sexual Activity   Alcohol use: No   Drug use: No   Sexual activity: Not on file  Other Topics Concern   Not on file  Social History Narrative   Married to [employer]       Social Determinants of Health   Financial Resource Strain: Low Risk  (05/12/2023)   Overall Financial Resource Strain (CARDIA)    Difficulty of Paying Living Expenses: Not hard at all  Food Insecurity: No Food Insecurity (05/12/2023)   Hunger Vital Sign    Worried About Running Out of Food in the Last Year: Never true    Ran Out of Food in the Last Year: Never true  Transportation Needs: No Transportation Needs (05/12/2023)   PRAPARE - Administrator, Civil Service (Medical): No    Lack of Transportation (Non-Medical): No  Physical Activity: Insufficiently Active (05/12/2023)   Exercise Vital Sign    Days of Exercise per Week: 2 days    Minutes of Exercise per Session: 60 min  Stress: No Stress Concern Present (05/12/2023)   [employer] of Occupational Health - Occupational Stress Questionnaire    Feeling of Stress : Not at all  Social Connections: Socially Integrated (05/12/2023)   Social Connection and Isolation Panel [NHANES]    Frequency of Communication with Friends and Family: More than three times a week    Frequency of Social Gatherings with Friends and Family: More than three times a week    Attends Religious Services: More than 4 times per year    Active Member of [employer] or Organizations: Yes    Attends Engineer, structural: More than 4 times per year    Marital Status: Married    Tobacco Counseling Counseling given: Not Answered   Clinical Intake:  Pre-visit preparation completed:  No  Pain : No/denies pain     BMI - recorded: 25.4 Nutritional Status: BMI 25 -29 Overweight Nutritional Risks: None Diabetes: No  How often do you need to have someone help you when you read instructions, pamphlets, or other written materials from your doctor or pharmacy?: 1 - Never  Diabetic?  No  Interpreter Needed?: No  Information entered by :: Theresa Mulligan LPN   Activities of Daily Living    05/12/2023    3:10 PM 01/12/2023    8:20 AM  In your present state of health, do you have any difficulty performing  the following activities:  Hearing? 1   Comment Wears hearing aids   Vision? 0   Difficulty concentrating or making decisions? 0   Walking or climbing stairs? 0   Dressing or bathing? 0   Doing errands, shopping? 0 0  Preparing Food and eating ? N   Using the Toilet? N   In the past six months, have you accidently leaked urine? N   Do you have problems with loss of bowel control? N   Managing your Medications? N   Managing your Finances? N   Housekeeping or managing your Housekeeping? N     Patient Care Team: [name], MD as PCP - General (Family Medicine) Rollene Rotunda, MD as PCP - Cardiology (Cardiology) Clance, Maree Krabbe, MD as Referring Physician (Pulmonary Disease) Barnett Abu, MD as Consulting Physician (Neurosurgery) Rachael Fee, MD as Attending Physician (Gastroenterology) Jerilee Field, MD as Consulting Physician (Urology) Talmage Coin, MD as Consulting Physician (Endocrinology) Ollen Gross, MD as Consulting Physician (Orthopedic Surgery)  Indicate any recent Medical Services you may have received from other than [facility] providers in the past year (date may be approximate).     Assessment:   This is a routine wellness examination for Kaito.  Hearing/Vision screen Hearing Screening - Comments:: Wears hearing aids Vision Screening - Comments:: Wears rx glasses - up to date with routine eye exams with  Dr Honor Loh  Dietary  issues and exercise activities discussed: Current Exercise Habits: Home exercise routine, Type of exercise: walking, Time (Minutes): 60, Frequency (Times/Week): 2, Weekly Exercise (Minutes/Week): 120, Intensity: Moderate, Exercise limited by: None identified   Goals Addressed               This Visit's Progress     Stay Healthy (pt-stated)         Depression Screen    05/12/2023    3:10 PM 03/26/2023   10:14 AM 02/23/2023    8:57 AM 08/18/2022    4:17 PM 01/17/2022    9:21 AM 05/27/2021   12:05 PM 01/03/2021    1:35 PM  PHQ 2/9 Scores  PHQ - 2 Score 0 0 0 1 0 0 0  PHQ- 9 Score 0 3  5 2  0 3    Fall Risk    05/12/2023    3:12 PM 08/18/2022    4:12 PM 01/17/2022    9:21 AM 05/27/2021   12:05 PM 01/03/2021    1:35 PM  Fall Risk   Falls in the past year? 0 1 0 0 1  Number falls in past yr: 0 0 0 0 0  Injury with Fall? 0 0 0 0 0  Risk for fall due to : No Fall Risks No Fall Risks No Fall Risks    Follow up Falls prevention discussed Falls evaluation completed Falls evaluation completed Falls evaluation completed Falls evaluation completed    FALL RISK PREVENTION PERTAINING TO THE HOME:  Any stairs in or around the home? Yes  If so, are there any without handrails? No  Home free of loose throw rugs in walkways, pet beds, electrical cords, etc? Yes  Adequate lighting in your home to reduce risk of falls? Yes   ASSISTIVE DEVICES UTILIZED TO PREVENT FALLS:  Life alert? No  Use of a cane, walker or w/c? No  Grab bars in the bathroom? No  Shower chair or bench in shower? No  Elevated toilet seat or a handicapped toilet? No   TIMED UP AND GO:  Was the  test performed? No . Audio Visit   Cognitive Function:        05/12/2023    3:14 PM 01/03/2021    1:37 PM 03/23/2019    8:56 AM  6CIT Screen  What Year? 0 points 0 points 0 points  What month? 0 points 0 points 0 points  What time? 0 points 0 points 0 points  Count back from 20 0 points 2 points 0 points  Months in reverse  0 points 0 points 0 points  Repeat phrase 0 points 0 points 0 points  Total Score 0 points 2 points 0 points    Immunizations Immunization History  Administered Date(s) Administered   Pneumococcal Polysaccharide-23 06/23/2008   Td 06/23/2008    TDAP status: Due, Education has been provided regarding the importance of this vaccine. Advised may receive this vaccine at local pharmacy or Health Dept. Aware to provide a copy of the vaccination record if obtained from local pharmacy or Health Dept. Verbalized acceptance and understanding.    Pneumococcal vaccine status: Due, Education has been provided regarding the importance of this vaccine. Advised may receive this vaccine at local pharmacy or Health Dept. Aware to provide a copy of the vaccination record if obtained from local pharmacy or Health Dept. Verbalized acceptance and understanding.  Covid-19 vaccine status: Declined, Education has been provided regarding the importance of this vaccine but patient still declined. Advised may receive this vaccine at local pharmacy or Health Dept.or vaccine clinic. Aware to provide a copy of the vaccination record if obtained from local pharmacy or Health Dept. Verbalized acceptance and understanding.  Qualifies for Shingles Vaccine? Yes   Zostavax completed No   Shingrix Completed?: No.    Education has been provided regarding the importance of this vaccine. Patient has been advised to call insurance company to determine out of pocket expense if they have not yet received this vaccine. Advised may also receive vaccine at local pharmacy or Health Dept. Verbalized acceptance and understanding.  Screening Tests Health Maintenance  Topic Date Due   COVID-19 Vaccine (1) Never done   Zoster Vaccines- Shingrix (1 of 2) Never done   Pneumonia Vaccine 45+ Years old (2 of 2 - PCV) 06/23/2009   DTaP/Tdap/Td (2 - Tdap) 06/23/2018   INFLUENZA VACCINE  07/02/2023   HEMOGLOBIN A1C  09/25/2023   Diabetic  kidney evaluation - eGFR measurement  03/25/2024   Diabetic kidney evaluation - Urine ACR  03/25/2024   Medicare Annual Wellness (AWV)  05/11/2024   HPV VACCINES  Aged Out   FOOT EXAM  Discontinued   OPHTHALMOLOGY EXAM  Discontinued    Health Maintenance  Health Maintenance Due  Topic Date Due   COVID-19 Vaccine (1) Never done   Zoster Vaccines- Shingrix (1 of 2) Never done   Pneumonia Vaccine 3+ Years old (2 of 2 - PCV) 06/23/2009   DTaP/Tdap/Td (2 - Tdap) 06/23/2018    Colorectal cancer screening: No longer required.   Lung Cancer Screening: (Low Dose CT Chest recommended if Age 26-80 years, 30 pack-year currently smoking OR have quit w/in 15years.) does not qualify.     Additional Screening:  Hepatitis C Screening: does not qualify; Completed   Vision Screening: Recommended annual ophthalmology exams for early detection of glaucoma and other disorders of the eye. Is the patient up to date with their annual eye exam?  Yes  Who is the provider or what is the name of the office in which the patient attends annual eye exams? Dr Honor Loh  If pt is not established with a provider, would they like to be referred to a provider to establish care? No .   Dental Screening: Recommended annual dental exams for proper oral hygiene  Community Resource Referral / Chronic Care Management:  CRR required this visit?  No   CCM required this visit?  No      Plan:     I have personally reviewed and noted the following in the patient's chart:   Medical and social history Use of alcohol, tobacco or illicit drugs  Current medications and supplements including opioid prescriptions. Patient currently taking opioids Functional ability and status Nutritional status Physical activity Advanced directives List of other physicians Hospitalizations, surgeries, and ER visits in previous 12 months Vitals Screenings to include cognitive, depression, and falls Referrals and appointments  In  addition, I have reviewed and discussed with patient certain preventive protocols, quality metrics, and best practice recommendations. A written personalized care plan for preventive services as well as general preventive health recommendations were provided to patient.     Tillie Rung, LPN   01/14/864   Nurse Notes: None

## 2023-05-13 DIAGNOSIS — M542 Cervicalgia: Secondary | ICD-10-CM | POA: Diagnosis not present

## 2023-05-13 DIAGNOSIS — N401 Enlarged prostate with lower urinary tract symptoms: Secondary | ICD-10-CM | POA: Diagnosis not present

## 2023-05-13 DIAGNOSIS — R351 Nocturia: Secondary | ICD-10-CM | POA: Diagnosis not present

## 2023-05-19 ENCOUNTER — Ambulatory Visit: Payer: Medicare HMO | Admitting: Family Medicine

## 2023-05-27 ENCOUNTER — Ambulatory Visit (INDEPENDENT_AMBULATORY_CARE_PROVIDER_SITE_OTHER): Payer: Medicare HMO | Admitting: Family Medicine

## 2023-05-27 ENCOUNTER — Encounter: Payer: Self-pay | Admitting: Family Medicine

## 2023-05-27 ENCOUNTER — Telehealth: Payer: Self-pay

## 2023-05-27 VITALS — BP 137/69 | HR 60 | Temp 97.7°F | Ht 65.5 in | Wt 150.0 lb

## 2023-05-27 DIAGNOSIS — I10 Essential (primary) hypertension: Secondary | ICD-10-CM

## 2023-05-27 DIAGNOSIS — E119 Type 2 diabetes mellitus without complications: Secondary | ICD-10-CM

## 2023-05-27 DIAGNOSIS — M542 Cervicalgia: Secondary | ICD-10-CM | POA: Diagnosis not present

## 2023-05-27 DIAGNOSIS — R351 Nocturia: Secondary | ICD-10-CM

## 2023-05-27 DIAGNOSIS — N401 Enlarged prostate with lower urinary tract symptoms: Secondary | ICD-10-CM | POA: Diagnosis not present

## 2023-05-27 NOTE — Progress Notes (Unsigned)
   Established Patient Office Visit  Subjective   Patient ID: Ronald Kelley, male    DOB: 1941-09-14  Age: 82 y.o. MRN: [id number]  No chief complaint on file.   HPI  Hypertension - pt happy with his blood pressure today. Was at the orthopedist office earlier this morning getting PT.  Asked if he had decreased his salt intake since our last visit and he said he didn't think so.    Nocturia - pt saw the PA at his urologist's office, Dr. Lennox Laity.  Was given samples of a medication for his nocturia.  Does not remember the name of it.    Diabetes - pt would like his A1c checked today. Pt had it checked 2 months ago.  After discussing it with the patient he would like to come back for a lab visit in one month for recheck.     The ASCVD Risk score (Arnett DK, et al., 2019) failed to calculate for the following reasons:   The 2019 ASCVD risk score is only valid for ages 90 to 64   {History (Optional):23778}  ROS    Objective:     There were no vitals taken for this visit. {Vitals History (Optional):23777}  Physical Exam Gen: alert, oriented Pulm: no respiratory distress Msk: normal gait.   Psych: pleasant affect.    No results found for any visits on 05/27/23.  {Labs (Optional):23779}      Assessment & Plan:   There are no diagnoses linked to this encounter.   No follow-ups on file.    [name], MD

## 2023-05-27 NOTE — Patient Instructions (Signed)
It was nice to see you today,  We addressed the following topics today: - I will put in an order for an A1c for you to get in 1 month.   - please let us know what your urology medication is so we can put it in your chart.  - follow up with me in 4 months.   Have a great day,  Ronald Jericho, MD

## 2023-05-27 NOTE — Telephone Encounter (Signed)
Pt called to report he is now taking Gemtesa- help with over active bladder.

## 2023-05-28 ENCOUNTER — Other Ambulatory Visit: Payer: Self-pay | Admitting: Family Medicine

## 2023-05-28 NOTE — Assessment & Plan Note (Signed)
Pt saw urology and was given samples of a medication to help with his nocturia.  Does not know what they are called.  Not in the chart bc they were samples.  Pt states he will call back with the name of the medication.

## 2023-05-28 NOTE — Assessment & Plan Note (Signed)
Pt to get A1c in one month, follow up with me in 4 months.  Still wishes to treat with diet only.

## 2023-05-28 NOTE — Assessment & Plan Note (Signed)
Improved.  Still wishes to use diet only.  Advised pt to limit his salt intake.

## 2023-06-03 DIAGNOSIS — M75120 Complete rotator cuff tear or rupture of unspecified shoulder, not specified as traumatic: Secondary | ICD-10-CM | POA: Insufficient documentation

## 2023-06-08 ENCOUNTER — Other Ambulatory Visit: Payer: Self-pay | Admitting: Family Medicine

## 2023-06-08 DIAGNOSIS — R7989 Other specified abnormal findings of blood chemistry: Secondary | ICD-10-CM

## 2023-06-10 DIAGNOSIS — M542 Cervicalgia: Secondary | ICD-10-CM | POA: Diagnosis not present

## 2023-06-11 DIAGNOSIS — M75122 Complete rotator cuff tear or rupture of left shoulder, not specified as traumatic: Secondary | ICD-10-CM | POA: Diagnosis not present

## 2023-06-19 DIAGNOSIS — H401131 Primary open-angle glaucoma, bilateral, mild stage: Secondary | ICD-10-CM | POA: Diagnosis not present

## 2023-06-22 DIAGNOSIS — M542 Cervicalgia: Secondary | ICD-10-CM | POA: Diagnosis not present

## 2023-06-23 DIAGNOSIS — R351 Nocturia: Secondary | ICD-10-CM | POA: Diagnosis not present

## 2023-06-23 DIAGNOSIS — N401 Enlarged prostate with lower urinary tract symptoms: Secondary | ICD-10-CM | POA: Diagnosis not present

## 2023-06-30 ENCOUNTER — Other Ambulatory Visit: Payer: Medicare HMO

## 2023-07-14 ENCOUNTER — Other Ambulatory Visit: Payer: Medicare HMO

## 2023-07-14 DIAGNOSIS — E119 Type 2 diabetes mellitus without complications: Secondary | ICD-10-CM

## 2023-09-14 ENCOUNTER — Other Ambulatory Visit: Payer: Self-pay | Admitting: Family Medicine

## 2023-09-14 DIAGNOSIS — R7989 Other specified abnormal findings of blood chemistry: Secondary | ICD-10-CM

## 2023-09-28 ENCOUNTER — Encounter: Payer: Self-pay | Admitting: Family Medicine

## 2023-09-28 ENCOUNTER — Ambulatory Visit (INDEPENDENT_AMBULATORY_CARE_PROVIDER_SITE_OTHER): Payer: Medicare HMO | Admitting: Family Medicine

## 2023-09-28 VITALS — BP 169/78 | HR 52 | Ht 65.5 in | Wt 152.4 lb

## 2023-09-28 DIAGNOSIS — I10 Essential (primary) hypertension: Secondary | ICD-10-CM | POA: Diagnosis not present

## 2023-09-28 DIAGNOSIS — E119 Type 2 diabetes mellitus without complications: Secondary | ICD-10-CM

## 2023-09-28 MED ORDER — VALSARTAN 40 MG PO TABS: 40.0000 mg | ORAL_TABLET | Freq: Every day | ORAL | 3 refills | Status: DC

## 2023-09-28 NOTE — Progress Notes (Signed)
   Established Patient Office Visit  Subjective   Patient ID: KYWON BOULANGER, male    DOB: 07-28-1941  Age: 82 y.o. MRN: [id number]  Chief Complaint  Patient presents with   Medical Management of Chronic Issues    HPI  DM2-patient is concerned his A1c might be more elevated today.  States he likes to eat "sweets".  Likes Belvita cookies.  Likes jelly with his toast.  Understands what foods he needs to avoid.  Patient still declines medications.  Hypertension-patient blood pressure elevated today.  We discussed what a good goal for him would be.  Patient still hesitant to start medication.  He states if it was absolutely necessary he would start a medication if he were at risk of stroke.  I recommended that he start losartan.  We talked about how hypertension is a main driver of heart attack and stroke.  The ASCVD Risk score (Arnett DK, et al., 2019) failed to calculate for the following reasons:   The 2019 ASCVD risk score is only valid for ages 42 to 47  Health Maintenance Due  Topic Date Due   Zoster Vaccines- Shingrix (1 of 2) Never done   Pneumonia Vaccine 13+ Years old (2 of 2 - PCV) 06/23/2009   DTaP/Tdap/Td (2 - Tdap) 06/23/2018   INFLUENZA VACCINE  Never done   COVID-19 Vaccine (1 - 2023-24 season) Never done      Objective:     BP (!) 169/78   Pulse (!) 52   Ht 5' 5.5" (1.664 m)   Wt 152 lb 6.4 oz (69.1 kg)   SpO2 99%   BMI 24.97 kg/m    Physical Exam General: Alert, oriented CV: Regular rate and rhythm Pulmonary: Lungs clear bilaterally Psych: Pleasant affect   No results found for any visits on 09/28/23.      Assessment & Plan:   Primary hypertension Assessment & Plan: Patient still hesitant to take blood pressure medication.  First reading was 180s systolic, second was 150s.  Patient stated he would be willing to think about medications so I prescribed him valsartan.  Gave him more information about the medication, and advised him to discuss it  further with his wife.  Discussed the risk associated with hypertension including kidney disease and heart attack and stroke. - Follow-up in 1 month.  Orders: -     Basic metabolic panel -     Valsartan; Take 1 tablet (40 mg total) by mouth daily.  Dispense: 90 tablet; Refill: 3  Diet-controlled diabetes mellitus (HCC) Assessment & Plan: Patient prefers to venous blood draw for his A1c.  Therefore do not have the results of his latest A1c to discuss with him.  He continues to defer medications.  Discussed limiting his sugar intake.  Orders: -     Hemoglobin A1c     Return in about 4 weeks (around 10/26/2023) for HTN.    [name], MD

## 2023-09-28 NOTE — Assessment & Plan Note (Signed)
Patient prefers to venous blood draw for his A1c.  Therefore do not have the results of his latest A1c to discuss with him.  He continues to defer medications.  Discussed limiting his sugar intake.

## 2023-09-28 NOTE — Assessment & Plan Note (Signed)
Patient still hesitant to take blood pressure medication.  First reading was 180s systolic, second was 150s.  Patient stated he would be willing to think about medications so I prescribed him valsartan.  Gave him more information about the medication, and advised him to discuss it further with his wife.  Discussed the risk associated with hypertension including kidney disease and heart attack and stroke. - Follow-up in 1 month.

## 2023-09-28 NOTE — Patient Instructions (Signed)
It was nice to see you today,  We addressed the following topics today: -Your blood pressure remained elevated on recheck.  I will send in a medication called valsartan.  Take it once a day.  I have provided some information on this medication. - I will follow-up with you in 1 month.  Have a great day,  Frederic Jericho, MD

## 2023-09-29 LAB — BASIC METABOLIC PANEL
BUN/Creatinine Ratio: 17 (ref 10–24)
BUN: 16 mg/dL (ref 8–27)
CO2: 22 mmol/L (ref 20–29)
Calcium: 9 mg/dL (ref 8.6–10.2)
Chloride: 100 mmol/L (ref 96–106)
Creatinine, Ser: 0.96 mg/dL (ref 0.76–1.27)
Glucose: 152 mg/dL — ABNORMAL HIGH (ref 70–99)
Potassium: 3.7 mmol/L (ref 3.5–5.2)
Sodium: 142 mmol/L (ref 134–144)
eGFR: 78 mL/min/{1.73_m2} (ref >59)

## 2023-09-29 LAB — HEMOGLOBIN A1C
Est. average glucose Bld gHb Est-mCnc: 171 mg/dL
Hgb A1c MFr Bld: 7.6 % — ABNORMAL HIGH (ref 4.8–5.6)

## 2023-10-09 ENCOUNTER — Emergency Department (HOSPITAL_BASED_OUTPATIENT_CLINIC_OR_DEPARTMENT_OTHER)
Admission: EM | Admit: 2023-10-09 | Discharge: 2023-10-09 | Disposition: A | Discharge status: Home / Self Care | Payer: Medicare HMO | Attending: Emergency Medicine | Admitting: Emergency Medicine

## 2023-10-09 ENCOUNTER — Other Ambulatory Visit: Payer: Self-pay

## 2023-10-09 ENCOUNTER — Emergency Department (HOSPITAL_BASED_OUTPATIENT_CLINIC_OR_DEPARTMENT_OTHER): Payer: Medicare HMO

## 2023-10-09 ENCOUNTER — Ambulatory Visit
Admission: EM | Admit: 2023-10-09 | Discharge: 2023-10-09 | Disposition: A | Discharge status: Other Acute Inpatient Hospital | Payer: Medicare HMO

## 2023-10-09 ENCOUNTER — Other Ambulatory Visit (HOSPITAL_BASED_OUTPATIENT_CLINIC_OR_DEPARTMENT_OTHER): Payer: Self-pay

## 2023-10-09 ENCOUNTER — Encounter (HOSPITAL_BASED_OUTPATIENT_CLINIC_OR_DEPARTMENT_OTHER): Payer: Self-pay

## 2023-10-09 DIAGNOSIS — W06XXXA Fall from bed, initial encounter: Secondary | ICD-10-CM | POA: Diagnosis not present

## 2023-10-09 DIAGNOSIS — S0003XA Contusion of scalp, initial encounter: Secondary | ICD-10-CM | POA: Diagnosis not present

## 2023-10-09 DIAGNOSIS — Z23 Encounter for immunization: Secondary | ICD-10-CM | POA: Diagnosis not present

## 2023-10-09 DIAGNOSIS — M25561 Pain in right knee: Secondary | ICD-10-CM | POA: Diagnosis not present

## 2023-10-09 DIAGNOSIS — M47812 Spondylosis without myelopathy or radiculopathy, cervical region: Secondary | ICD-10-CM | POA: Diagnosis not present

## 2023-10-09 DIAGNOSIS — S0990XA Unspecified injury of head, initial encounter: Secondary | ICD-10-CM

## 2023-10-09 DIAGNOSIS — M25562 Pain in left knee: Secondary | ICD-10-CM | POA: Diagnosis not present

## 2023-10-09 DIAGNOSIS — S8002XA Contusion of left knee, initial encounter: Secondary | ICD-10-CM | POA: Diagnosis not present

## 2023-10-09 DIAGNOSIS — M4802 Spinal stenosis, cervical region: Secondary | ICD-10-CM | POA: Diagnosis not present

## 2023-10-09 DIAGNOSIS — M542 Cervicalgia: Secondary | ICD-10-CM | POA: Diagnosis not present

## 2023-10-09 DIAGNOSIS — E119 Type 2 diabetes mellitus without complications: Secondary | ICD-10-CM | POA: Insufficient documentation

## 2023-10-09 DIAGNOSIS — S0001XA Abrasion of scalp, initial encounter: Secondary | ICD-10-CM

## 2023-10-09 MED ORDER — TETANUS-DIPHTH-ACELL PERTUSSIS 5-2.5-18.5 LF-MCG/0.5 IM SUSY
0.5000 mL | PREFILLED_SYRINGE | Freq: Once | INTRAMUSCULAR | Status: AC
  Administered 2023-10-09: 0.5 mL via INTRAMUSCULAR
  Filled 2023-10-09: qty 0.5

## 2023-10-09 NOTE — ED Notes (Signed)
Patient is being discharged from the Urgent Care and sent to the Emergency Department via POV . Per HM, patient is in need of higher level of care due to head injury. Patient is aware and verbalizes understanding of plan of care.  Vitals:   10/09/23 1133  BP: (!) 163/80  Pulse: 65  Resp: 18  Temp: 98.1 F (36.7 C)  SpO2: 98%

## 2023-10-09 NOTE — Discharge Instructions (Signed)
You were seen in the Emergency Department (ED) today for a head injury. Your CT scan did not show any evidence of serious injury or bleeding.    Symptoms to expect from a concussion include nausea, mild to moderate headache, difficulty concentrating or sleeping, and mild lightheadedness.  These symptoms should improve over the next few days to weeks, but it may take many weeks before you feel back to normal.  Return to the emergency department or follow-up with your primary care doctor if your symptoms are not improving over this time.  Signs of a more serious head injury include vomiting, severe headache, excessive sleepiness or confusion, and weakness or numbness in your face, arms or legs.  Return immediately to the Emergency Department if you experience any of these more concerning symptoms.    Rest, avoid strenuous physical or mental activity, and avoid activities that could potentially result in another head injury until all your symptoms from this head injury are completely resolved for at least 2-3 weeks.  You may take acetaminophen over the counter according to label instructions for mild headache or scalp soreness.

## 2023-10-09 NOTE — ED Triage Notes (Signed)
"  I was having a dream and in it someone was chasing me, I fell out of bed hitting my head about 29 inches out of bed to floor". "Also hit left knee". "No loc". "Laceration to right upper part of head". No nausea. No vomiting. No visual changes. "This happened about 5am".

## 2023-10-09 NOTE — ED Provider Notes (Addendum)
[name]    CSN: [id number] Arrival date & time: 10/09/23  1124      History   Chief Complaint Chief Complaint  Patient presents with   Head Injury   Fall    HPI Ronald Kelley is a 82 y.o. male.   Patient presents for further evaluation after a fall from his bed at approximately 5 AM this morning.  Patient reports that he was having a dream that someone was chasing him which subsequently caused him to fall out of bed.  He states that he hit his head on the floor but did not lose consciousness.  He also reports that he hit bilateral knees on the floor as well.  He has an abrasion/laceration to his head.  He denies that he takes any blood thinning medications.  Reports he does have head pain where the laceration is but denies dizziness, blurred vision, nausea, vomiting.  He is not sure of last tetanus vaccination.   Head Injury Fall    Past Medical History:  Diagnosis Date   Atrial fib/flutter, transient (HCC)    Degenerative disk disease    Diabetes mellitus    a1c 6.5, 07/2008   GERD (gastroesophageal reflux disease)    History of inguinal hernia repair, bilateral 03/23/2019   History of kidney stones    History of vitamin D deficiency 09/27/2016   Hyperthyroidism    remote h/o , no ablation   OSA on CPAP     Patient Active Problem List   Diagnosis Date Noted   Full thickness rotator cuff tear 06/03/2023   Nocturnal polyuria 05/05/2023   Hypertension 02/23/2023   Ganglion cyst of volar aspect of right wrist 02/26/2021   Mass of joint of right wrist 11/18/2020   Palpitations 12/22/2019   Closed nondisplaced fracture of pelvis with routine healing 08/31/2019   Weakness generalized 08/31/2019   Elevated serum creatinine 08/31/2019   Constipation 08/31/2019   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   Environmental and seasonal allergies 08/31/2019   Ibuprofen adverse reaction 08/31/2019   Orthostatic hypotension 08/31/2019   Vitamin D deficiency  03/23/2019   Tear of medial meniscus of knee 08/10/2018   Elevated serum free T4 level 08/01/2017   Low TSH level 08/01/2017   Hyperlipidemia associated with type 2 diabetes mellitus (HCC) 11/25/2016   h/o Leukopenia 11/05/2016   Drug-induced low platelet count 11/05/2016   Impaired fasting glucose 09/27/2016   Degenerative disk disease 09/27/2016   Presbycusis 09/27/2016   Diet-controlled diabetes mellitus (HCC) 09/24/2016   BPH associated with nocturia 09/24/2016   h/o Osteopenia 07/30/2014   GERD (gastroesophageal reflux disease) 04/28/2013   Erectile dysfunction 10/08/2011   Elevated PSA/ nocturia sx 09/15/2011   OSA on CPAP 05/13/2010   H/O ATRIAL FIBRILLATION, PAROXYSMAL 09/28/2008   Inguinal hernia 06/23/2008   Hyperparathyroidism, primary (HCC) 06/23/2008    Past Surgical History:  Procedure Laterality Date   HERNIA REPAIR  2010   L5 acute HNP     s/p surgery 09/21/08-- still has occasional paretheisa of the left foot    PARATHYROIDECTOMY Right 01/22/2023   Procedure: RIGHT INFERIOR PARATHYROIDECTOMY;  Surgeon: Darnell Level, MD;  Location: [facility];  Service: General;  Laterality: Right;   ROTATOR CUFF REPAIR         Home Medications    Prior to Admission medications   Medication Sig Start Date End Date Taking? Authorizing Provider  ASPIRIN 81 PO Take 81 mg by mouth daily at 6 (six) AM. I took 2 this  morning after injury.   Yes [provider]  levothyroxine (SYNTHROID) 50 MCG tablet TAKE 1 TABLET BY MOUTH EVERY DAY BEFORE BREAKFAST 09/14/23  Yes [name], MD  Acetaminophen (TYLENOL) 325 MG CAPS     [provider]  Ascorbic Acid (VITAMIN C) 100 MG tablet Take 100 mg by mouth daily.    [provider]  Cholecalciferol (VITAMIN D3) 125 MCG (5000 UT) TABS 5,000 IU OTC vitamin D3 daily. 08/31/19   Opalski, Gavin Pound, DO  meloxicam (MOBIC) 7.5 MG tablet Take 7.5 mg by mouth daily. 04/28/23   [provider]  timolol (TIMOPTIC) 0.5 %  ophthalmic solution Place 1 drop into both eyes daily. 07/27/22   [provider]  traMADol (ULTRAM) 50 MG tablet Take 1 tablet (50 mg total) by mouth every 6 (six) hours as needed for moderate pain. 01/22/23   Darnell Level, MD  traMADol (ULTRAM) 50 MG tablet Take 1 tablet by mouth every 12 (twelve) hours.    [provider]  valsartan (DIOVAN) 40 MG tablet Take 1 tablet (40 mg total) by mouth daily. 09/28/23   [name], MD  Vibegron (GEMTESA) 75 MG TABS Take 75 mg by mouth daily at 6 (six) AM.    [provider]  Zinc Sulfate (ZINC 15 PO) Take 1 tablet by mouth daily.    [provider]    Family History Family History  Problem Relation Age of Onset   Hypertension Mother    Diabetes Brother    Alzheimer's disease Father 64   Coronary artery disease Neg Hx    Stroke Neg Hx    Colon cancer Neg Hx    Prostate cancer Neg Hx     Social History Social History   Tobacco Use   Smoking status: Never    Passive exposure: Never   Smokeless tobacco: Never  Vaping Use   Vaping status: Never Used  Substance Use Topics   Alcohol use: No   Drug use: No     Allergies   Hydrocodone-acetaminophen, Oxycodone, and Oxycodone-acetaminophen   Review of Systems Review of Systems Per HPI  Physical Exam Triage Vital Signs ED Triage Vitals  Encounter Vitals Group     BP 10/09/23 1133 (!) 163/80     Systolic BP Percentile --      Diastolic BP Percentile --      Pulse Rate 10/09/23 1133 65     Resp 10/09/23 1133 18     Temp 10/09/23 1133 98.1 F (36.7 C)     Temp Source 10/09/23 1133 Oral     SpO2 10/09/23 1133 98 %     Weight 10/09/23 1132 152 lb 5.4 oz (69.1 kg)     Height 10/09/23 1132 5' 5.5" (1.664 m)     Head Circumference --      Peak Flow --      Pain Score 10/09/23 1127 6     Pain Loc --      Pain Education --      Exclude from Growth Chart --    No data found.  Updated Vital Signs BP (!) 163/80 (BP Location: Left Arm) Comment:  "Not my normal, but you can take in about and it will be fine".  Pulse 65   Temp 98.1 F (36.7 C) (Oral)   Resp 18   Ht 5' 5.5" (1.664 m)   Wt 152 lb 5.4 oz (69.1 kg)   SpO2 98%   BMI 24.96 kg/m  Visual Acuity Right Eye Distance:   Left Eye Distance:   Bilateral Distance:    Right Eye Near:   Left Eye Near:    Bilateral Near:     Physical Exam Constitutional:      General: He is not in acute distress.    Appearance: Normal appearance. He is not toxic-appearing or diaphoretic.  HENT:     Head: Normocephalic and atraumatic.     Comments: Patient has approximately 2.5 cm superficial laceration that appears to be curvilinear present to right lateral head. Wound edges are closely approximated with no active bleeding. He has surrounding bruising discoloration and swelling that extends down into the upper orbit of the right eye. Eyes:     Extraocular Movements: Extraocular movements intact.     Conjunctiva/sclera: Conjunctivae normal.  Pulmonary:     Effort: Pulmonary effort is normal.  Musculoskeletal:     Comments: Patient has tenderness to palpation to medial portion of left anterior knee.  Also has tenderness to palpation to upper anterior right knee.  There is no swelling, discoloration, lacerations, abrasions to either knee.  Full range of motion of knee is present.  Capillary refill and pulses intact.  Neurological:     General: No focal deficit present.     Mental Status: He is alert and oriented to person, place, and time. Mental status is at baseline.     Cranial Nerves: Cranial nerves 2-12 are intact.     Sensory: Sensation is intact.     Motor: Motor function is intact.     Coordination: Coordination is intact.     Gait: Gait is intact.  Psychiatric:        Mood and Affect: Mood normal.        Behavior: Behavior normal.        Thought Content: Thought content normal.        Judgment: Judgment normal.      UC Treatments / Results  Labs (all labs  ordered are listed, but only abnormal results are displayed) Labs Reviewed - No data to display  EKG   Radiology No results found.  Procedures Procedures (including critical care time)  Medications Ordered in UC Medications - No data to display  Initial Impression / Assessment and Plan / UC Course  I have reviewed the triage vital signs and the nursing notes.  Pertinent labs & imaging results that were available during my care of the patient were reviewed by me and considered in my medical decision making (see chart for details).     Given patient's head injury, swelling noted to head and orbital region, and patient's age, recommended to patient that he go to the emergency department today to have imaging of the head.  Patient was agreeable with this plan.  Vital signs and neuroexam stable at discharge.  Agree with patient's wife transporting him to the ER.  Will defer tetanus vaccination and further evaluation and management of head injury and knee pain to the ER given concern of head injury and patient's age. Patient declined wound care prior to discharge.  Final Clinical Impressions(s) / UC Diagnoses   Final diagnoses:  Injury of head, initial encounter  Acute pain of both knees  Fall from bed, initial encounter   Discharge Instructions   None    ED Prescriptions   None    PDMP not reviewed this encounter.   Gustavus Bryant, [place] 10/09/23 1150    Gustavus Bryant, [place] 10/09/23 1150

## 2023-10-09 NOTE — ED Provider Notes (Signed)
Emergency Department Provider Note   I have reviewed the triage vital signs and the nursing notes.   HISTORY  Chief Complaint Fall   HPI Ronald BORGHESE is a 82 y.o. male with past history reviewed below presents emergency department with head injury after rolling out of bed this morning.  Patient states he is having a bad dream, rolled, hit his head on the hardwood floor.  He woke up immediately with some discomfort.  There is some swelling but no vision changes.  He is having some mild pain to the left knee but has been ambulatory.  Pain is worse medially.  No significant neck or lower back pain.  No chest or abdominal discomfort.  Initially went to urgent care but was referred here for head imaging.   Past Medical History:  Diagnosis Date   Atrial fib/flutter, transient (HCC)    Degenerative disk disease    Diabetes mellitus    a1c 6.5, 07/2008   GERD (gastroesophageal reflux disease)    History of inguinal hernia repair, bilateral 03/23/2019   History of kidney stones    History of vitamin D deficiency 09/27/2016   Hyperthyroidism    remote h/o , no ablation   OSA on CPAP     Review of Systems  Constitutional: No fever/chills Cardiovascular: Denies chest pain. Respiratory: Denies shortness of breath. Gastrointestinal: No abdominal pain. Genitourinary: Negative for dysuria. Musculoskeletal: Negative for back pain. Positive left knee pain.  Skin: Negative for rash. Neurological: Negative for focal weakness or numbness. Positive head injury.   ____________________________________________   PHYSICAL EXAM:  VITAL SIGNS: ED Triage Vitals  Encounter Vitals Group     BP 10/09/23 1215 (!) 168/94     Pulse Rate 10/09/23 1215 67     Resp 10/09/23 1215 16     Temp 10/09/23 1213 98.1 F (36.7 C)     Temp src --      SpO2 10/09/23 1215 98 %   Constitutional: Alert and oriented. Well appearing and in no acute distress. Eyes: Conjunctivae are normal. PERRL.  EOMI. Head: Right forehead hematoma with superficial abrasion. No deeper laceration.  Nose: No congestion/rhinnorhea. Mouth/Throat: Mucous membranes are moist.  Neck: No stridor.   Cardiovascular: Normal rate, regular rhythm. Good peripheral circulation. Grossly normal heart sounds.   Respiratory: Normal respiratory effort.  No retractions. Lungs CTAB. Gastrointestinal: Soft and nontender. No distention.  Musculoskeletal: No lower extremity edema. No gross deformities of extremities. Normal ROM of the left hip. Mild tenderness to the medial left knee.  Neurologic:  Normal speech and language. No gross focal neurologic deficits are appreciated.  Skin:  Skin is warm, dry and intact. No rash noted.   ____________________________________________  RADIOLOGY  DG Knee Complete 4 Views Left  Result Date: 10/09/2023 CLINICAL DATA:  Rolled out of bed, fall.  Left knee pain. EXAM: LEFT KNEE - COMPLETE 4+ VIEW COMPARISON:  None Available. FINDINGS: No evidence of fracture, dislocation, or joint effusion. Advanced chondrocalcinosis. There are capsular calcifications. No erosive change. Soft tissue calcification adjacent to the medial femoral condyle may represent prior MCL injury. Soft tissues are unremarkable. IMPRESSION: 1. No fracture or subluxation of the left knee. 2. Advanced chondrocalcinosis. Electronically Signed   By: Narda Rutherford M.D.   On: 10/09/2023 15:20   CT Head Wo Contrast  Result Date: 10/09/2023 CLINICAL DATA:  Rolled out of bed onto hardwood floor, head and neck pain EXAM: CT HEAD WITHOUT CONTRAST CT CERVICAL SPINE WITHOUT CONTRAST TECHNIQUE: Multidetector CT imaging  of the head and cervical spine was performed following the standard protocol without intravenous contrast. Multiplanar CT image reconstructions of the cervical spine were also generated. RADIATION DOSE REDUCTION: This exam was performed according to the departmental dose-optimization program which includes automated  exposure control, adjustment of the mA and/or kV according to patient size and/or use of iterative reconstruction technique. COMPARISON:  None Available. FINDINGS: CT HEAD FINDINGS Brain: No evidence of acute infarct, hemorrhage, mass, mass effect, or midline shift. No hydrocephalus or extra-axial fluid collection. Vascular: No hyperdense vessel. Skull: Negative for fracture or focal lesion. Right frontal scalp hematoma. Sinuses/Orbits: No acute finding. Status post bilateral lens replacements. Other: The mastoid air cells are well aerated. CT CERVICAL SPINE FINDINGS Alignment: No traumatic listhesis. Trace anterolisthesis of C4 on C5, C7 on T1, T1 on T2, and T2 on T3, which appear facet mediated. Skull base and vertebrae: No acute fracture or suspicious osseous lesion. Degenerative pannus formation about the dens. Soft tissues and spinal canal: No prevertebral fluid or swelling. No visible canal hematoma. Disc levels: Degenerative changes in the cervical spine.Moderate spinal canal stenosis at C5-C6. Upper chest: No focal pulmonary opacity or pleural effusion. IMPRESSION: 1. No acute intracranial process. Right frontal scalp hematoma. 2. No acute fracture or traumatic listhesis in the cervical spine. Electronically Signed   By: Wiliam Ke M.D.   On: 10/09/2023 13:54   CT Cervical Spine Wo Contrast  Result Date: 10/09/2023 CLINICAL DATA:  Rolled out of bed onto hardwood floor, head and neck pain EXAM: CT HEAD WITHOUT CONTRAST CT CERVICAL SPINE WITHOUT CONTRAST TECHNIQUE: Multidetector CT imaging of the head and cervical spine was performed following the standard protocol without intravenous contrast. Multiplanar CT image reconstructions of the cervical spine were also generated. RADIATION DOSE REDUCTION: This exam was performed according to the departmental dose-optimization program which includes automated exposure control, adjustment of the mA and/or kV according to patient size and/or use of iterative  reconstruction technique. COMPARISON:  None Available. FINDINGS: CT HEAD FINDINGS Brain: No evidence of acute infarct, hemorrhage, mass, mass effect, or midline shift. No hydrocephalus or extra-axial fluid collection. Vascular: No hyperdense vessel. Skull: Negative for fracture or focal lesion. Right frontal scalp hematoma. Sinuses/Orbits: No acute finding. Status post bilateral lens replacements. Other: The mastoid air cells are well aerated. CT CERVICAL SPINE FINDINGS Alignment: No traumatic listhesis. Trace anterolisthesis of C4 on C5, C7 on T1, T1 on T2, and T2 on T3, which appear facet mediated. Skull base and vertebrae: No acute fracture or suspicious osseous lesion. Degenerative pannus formation about the dens. Soft tissues and spinal canal: No prevertebral fluid or swelling. No visible canal hematoma. Disc levels: Degenerative changes in the cervical spine.Moderate spinal canal stenosis at C5-C6. Upper chest: No focal pulmonary opacity or pleural effusion. IMPRESSION: 1. No acute intracranial process. Right frontal scalp hematoma. 2. No acute fracture or traumatic listhesis in the cervical spine. Electronically Signed   By: Wiliam Ke M.D.   On: 10/09/2023 13:54    ____________________________________________   PROCEDURES  Procedure(s) performed:   Procedures  None  ____________________________________________   INITIAL IMPRESSION / ASSESSMENT AND PLAN / ED COURSE  Pertinent labs & imaging results that were available during my care of the patient were reviewed by me and considered in my medical decision making (see chart for details).   This patient is Presenting for Evaluation of head injury, which does require a range of treatment options, and is a complaint that involves a high risk of morbidity and  mortality.  The Differential Diagnoses includes subdural hematoma, epidural hematoma, acute concussion, traumatic subarachnoid hemorrhage, cerebral contusions, etc.   Critical  Interventions-    Medications  Tdap (BOOSTRIX) injection 0.5 mL (0.5 mLs Intramuscular Given 10/09/23 1436)    Reassessment after intervention: no immediate adverse effects with Tdap.   I did obtain Additional Historical Information from wife at bedside.   I decided to review pertinent External Data, and in summary patient seen at [facility] just prior to evaluation.  Radiologic Tests Ordered, included CT head, c spine, and left knee XR. I independently interpreted the images and agree with radiology interpretation.   Cardiac Monitor Tracing which shows NSR.   Social Determinants of Health Risk patient is a non-smoker.   Medical Decision Making: Summary:  Patient presents emergency department with face pain and swelling after head injury.  Fall was mechanical, rolling out of bed.  No evidence of entrapment.  Low suspicion for facial bone injury with no tenderness on exam.  Patient has a abrasion to the scalp does not require laceration repair.  Plan to update tetanus and obtain CT imaging of the head, C-spine, and left knee XR.  Reevaluation with update and discussion with patient. CT head and c spine without acute abnormalities.   Patient's presentation is most consistent with acute, uncomplicated illness.   Disposition: discharge  ____________________________________________  FINAL CLINICAL IMPRESSION(S) / ED DIAGNOSES  Final diagnoses:  Injury of head, initial encounter  Hematoma of scalp, initial encounter  Abrasion of scalp, initial encounter  Acute pain of left knee    Note:  This document was prepared using Dragon voice recognition software and may include unintentional dictation errors.  Alona Bene, MD, [facility]    Camila Maita, Arlyss Repress, MD 10/10/23 [phone]

## 2023-10-09 NOTE — ED Triage Notes (Signed)
Pt reports "having a bad dream, rolled out of bed & hit the hardwood floors." Happened approx 5a, reports that they went to UC for same- "advised to come to ED to r/o bleed." Pt also c/o L knee pain

## 2023-10-26 ENCOUNTER — Ambulatory Visit: Payer: Medicare HMO | Admitting: Family Medicine

## 2023-10-26 NOTE — Progress Notes (Deleted)
   Established Patient Office Visit  Subjective   Patient ID: Ronald Kelley, male    DOB: 10/05/1941  Age: 82 y.o. MRN: [id number]  No chief complaint on file.   HPI  Head injury - bad dream  HTN - did he take losartan -    The ASCVD Risk score (Arnett DK, et al., 2019) failed to calculate for the following reasons:   The 2019 ASCVD risk score is only valid for ages 64 to 6  Health Maintenance Due  Topic Date Due   Zoster Vaccines- Shingrix (1 of 2) Never done   Pneumonia Vaccine 41+ Years old (2 of 2 - PCV) 06/23/2009   INFLUENZA VACCINE  Never done   COVID-19 Vaccine (1 - 2023-24 season) Never done      Objective:     There were no vitals taken for this visit. {Vitals History (Optional):23777}  Physical Exam   No results found for any visits on 10/26/23.      Assessment & Plan:   There are no diagnoses linked to this encounter.   No follow-ups on file.    [name], MD

## 2023-11-04 ENCOUNTER — Other Ambulatory Visit: Payer: Self-pay

## 2023-11-04 ENCOUNTER — Encounter (HOSPITAL_BASED_OUTPATIENT_CLINIC_OR_DEPARTMENT_OTHER): Payer: Self-pay

## 2023-11-04 ENCOUNTER — Inpatient Hospital Stay (HOSPITAL_COMMUNITY): Payer: Medicare HMO

## 2023-11-04 ENCOUNTER — Emergency Department (HOSPITAL_BASED_OUTPATIENT_CLINIC_OR_DEPARTMENT_OTHER): Payer: Medicare HMO

## 2023-11-04 ENCOUNTER — Inpatient Hospital Stay (HOSPITAL_BASED_OUTPATIENT_CLINIC_OR_DEPARTMENT_OTHER)
Admission: EM | Admit: 2023-11-04 | Discharge: 2023-11-06 | DRG: 064 | Disposition: A | Discharge status: Home / Self Care | Payer: Medicare HMO | Attending: Family Medicine | Admitting: Family Medicine

## 2023-11-04 DIAGNOSIS — I63512 Cerebral infarction due to unspecified occlusion or stenosis of left middle cerebral artery: Principal | ICD-10-CM | POA: Diagnosis present

## 2023-11-04 DIAGNOSIS — N401 Enlarged prostate with lower urinary tract symptoms: Secondary | ICD-10-CM | POA: Diagnosis not present

## 2023-11-04 DIAGNOSIS — G4733 Obstructive sleep apnea (adult) (pediatric): Secondary | ICD-10-CM | POA: Diagnosis not present

## 2023-11-04 DIAGNOSIS — R1310 Dysphagia, unspecified: Secondary | ICD-10-CM | POA: Diagnosis not present

## 2023-11-04 DIAGNOSIS — Z79890 Hormone replacement therapy (postmenopausal): Secondary | ICD-10-CM

## 2023-11-04 DIAGNOSIS — I771 Stricture of artery: Secondary | ICD-10-CM | POA: Diagnosis not present

## 2023-11-04 DIAGNOSIS — E039 Hypothyroidism, unspecified: Secondary | ICD-10-CM | POA: Diagnosis not present

## 2023-11-04 DIAGNOSIS — I480 Paroxysmal atrial fibrillation: Secondary | ICD-10-CM | POA: Diagnosis present

## 2023-11-04 DIAGNOSIS — S066XAA Traumatic subarachnoid hemorrhage with loss of consciousness status unknown, initial encounter: Secondary | ICD-10-CM | POA: Diagnosis present

## 2023-11-04 DIAGNOSIS — E1169 Type 2 diabetes mellitus with other specified complication: Secondary | ICD-10-CM | POA: Diagnosis present

## 2023-11-04 DIAGNOSIS — S0083XA Contusion of other part of head, initial encounter: Secondary | ICD-10-CM | POA: Diagnosis not present

## 2023-11-04 DIAGNOSIS — R471 Dysarthria and anarthria: Secondary | ICD-10-CM | POA: Diagnosis present

## 2023-11-04 DIAGNOSIS — G510 Bell's palsy: Secondary | ICD-10-CM | POA: Diagnosis present

## 2023-11-04 DIAGNOSIS — W19XXXA Unspecified fall, initial encounter: Secondary | ICD-10-CM | POA: Diagnosis not present

## 2023-11-04 DIAGNOSIS — S066X0A Traumatic subarachnoid hemorrhage without loss of consciousness, initial encounter: Secondary | ICD-10-CM

## 2023-11-04 DIAGNOSIS — G936 Cerebral edema: Secondary | ICD-10-CM | POA: Diagnosis not present

## 2023-11-04 DIAGNOSIS — E785 Hyperlipidemia, unspecified: Secondary | ICD-10-CM | POA: Diagnosis not present

## 2023-11-04 DIAGNOSIS — E119 Type 2 diabetes mellitus without complications: Secondary | ICD-10-CM

## 2023-11-04 DIAGNOSIS — Z79899 Other long term (current) drug therapy: Secondary | ICD-10-CM | POA: Diagnosis not present

## 2023-11-04 DIAGNOSIS — D72829 Elevated white blood cell count, unspecified: Secondary | ICD-10-CM | POA: Diagnosis not present

## 2023-11-04 DIAGNOSIS — I10 Essential (primary) hypertension: Secondary | ICD-10-CM | POA: Diagnosis present

## 2023-11-04 DIAGNOSIS — E876 Hypokalemia: Secondary | ICD-10-CM | POA: Diagnosis present

## 2023-11-04 DIAGNOSIS — K219 Gastro-esophageal reflux disease without esophagitis: Secondary | ICD-10-CM | POA: Diagnosis not present

## 2023-11-04 DIAGNOSIS — E213 Hyperparathyroidism, unspecified: Secondary | ICD-10-CM | POA: Diagnosis present

## 2023-11-04 DIAGNOSIS — I63532 Cerebral infarction due to unspecified occlusion or stenosis of left posterior cerebral artery: Secondary | ICD-10-CM | POA: Diagnosis not present

## 2023-11-04 DIAGNOSIS — Z885 Allergy status to narcotic agent status: Secondary | ICD-10-CM

## 2023-11-04 DIAGNOSIS — R29705 NIHSS score 5: Secondary | ICD-10-CM | POA: Diagnosis present

## 2023-11-04 DIAGNOSIS — I6782 Cerebral ischemia: Secondary | ICD-10-CM | POA: Diagnosis not present

## 2023-11-04 DIAGNOSIS — I639 Cerebral infarction, unspecified: Secondary | ICD-10-CM | POA: Diagnosis not present

## 2023-11-04 DIAGNOSIS — Z833 Family history of diabetes mellitus: Secondary | ICD-10-CM

## 2023-11-04 DIAGNOSIS — R351 Nocturia: Secondary | ICD-10-CM | POA: Diagnosis present

## 2023-11-04 DIAGNOSIS — Z8249 Family history of ischemic heart disease and other diseases of the circulatory system: Secondary | ICD-10-CM

## 2023-11-04 DIAGNOSIS — R569 Unspecified convulsions: Secondary | ICD-10-CM | POA: Diagnosis not present

## 2023-11-04 DIAGNOSIS — R29706 NIHSS score 6: Secondary | ICD-10-CM | POA: Diagnosis not present

## 2023-11-04 DIAGNOSIS — I4891 Unspecified atrial fibrillation: Secondary | ICD-10-CM | POA: Diagnosis present

## 2023-11-04 DIAGNOSIS — I609 Nontraumatic subarachnoid hemorrhage, unspecified: Principal | ICD-10-CM | POA: Diagnosis present

## 2023-11-04 DIAGNOSIS — R200 Anesthesia of skin: Secondary | ICD-10-CM | POA: Diagnosis not present

## 2023-11-04 DIAGNOSIS — R4701 Aphasia: Secondary | ICD-10-CM | POA: Diagnosis not present

## 2023-11-04 DIAGNOSIS — Z87442 Personal history of urinary calculi: Secondary | ICD-10-CM

## 2023-11-04 DIAGNOSIS — R4781 Slurred speech: Secondary | ICD-10-CM | POA: Diagnosis not present

## 2023-11-04 LAB — CBC
HCT: 41.8 % (ref 39.0–52.0)
HCT: 46.5 % (ref 39.0–52.0)
Hemoglobin: 13.9 g/dL (ref 13.0–17.0)
Hemoglobin: 15.4 g/dL (ref 13.0–17.0)
MCH: 30.7 pg (ref 26.0–34.0)
MCH: 30.7 pg (ref 26.0–34.0)
MCHC: 33.1 g/dL (ref 30.0–36.0)
MCHC: 33.3 g/dL (ref 30.0–36.0)
MCV: 92.3 fL (ref 80.0–100.0)
MCV: 92.6 fL (ref 80.0–100.0)
Platelets: 112 10*3/uL — ABNORMAL LOW (ref 150–400)
Platelets: 161 10*3/uL (ref 150–400)
RBC: 4.53 MIL/uL (ref 4.22–5.81)
RBC: 5.02 MIL/uL (ref 4.22–5.81)
RDW: 13.3 % (ref 11.5–15.5)
RDW: 13.3 % (ref 11.5–15.5)
WBC: 11.1 10*3/uL — ABNORMAL HIGH (ref 4.0–10.5)
WBC: 8.8 10*3/uL (ref 4.0–10.5)
nRBC: 0 % (ref 0.0–0.2)
nRBC: 0 % (ref 0.0–0.2)

## 2023-11-04 LAB — COMPREHENSIVE METABOLIC PANEL
ALT: 14 U/L (ref 0–44)
AST: 14 U/L — ABNORMAL LOW (ref 15–41)
Albumin: 4.2 g/dL (ref 3.5–5.0)
Alkaline Phosphatase: 82 U/L (ref 38–126)
Anion gap: 9 (ref 5–15)
BUN: 25 mg/dL — ABNORMAL HIGH (ref 8–23)
CO2: 29 mmol/L (ref 22–32)
Calcium: 9.7 mg/dL (ref 8.9–10.3)
Chloride: 102 mmol/L (ref 98–111)
Creatinine, Ser: 1 mg/dL (ref 0.61–1.24)
GFR, Estimated: >60 mL/min (ref >60)
Glucose, Bld: 131 mg/dL — ABNORMAL HIGH (ref 70–99)
Potassium: 3.6 mmol/L (ref 3.5–5.1)
Sodium: 140 mmol/L (ref 135–145)
Total Bilirubin: 1.3 mg/dL — ABNORMAL HIGH (ref <1.2)
Total Protein: 7.4 g/dL (ref 6.5–8.1)

## 2023-11-04 LAB — DIFFERENTIAL
Abs Immature Granulocytes: 0.03 10*3/uL (ref 0.00–0.07)
Basophils Absolute: 0 10*3/uL (ref 0.0–0.1)
Basophils Relative: 0 %
Eosinophils Absolute: 0.3 10*3/uL (ref 0.0–0.5)
Eosinophils Relative: 3 %
Immature Granulocytes: 0 %
Lymphocytes Relative: 32 %
Lymphs Abs: 3.6 10*3/uL (ref 0.7–4.0)
Monocytes Absolute: 1.1 10*3/uL — ABNORMAL HIGH (ref 0.1–1.0)
Monocytes Relative: 10 %
Neutro Abs: 6 10*3/uL (ref 1.7–7.7)
Neutrophils Relative %: 55 %

## 2023-11-04 LAB — ETHANOL: Alcohol, Ethyl (B): <10 mg/dL (ref <10)

## 2023-11-04 LAB — CBG MONITORING, ED: Glucose-Capillary: 147 mg/dL — ABNORMAL HIGH (ref 70–99)

## 2023-11-04 LAB — APTT: aPTT: 28 s (ref 24–36)

## 2023-11-04 LAB — PROTIME-INR
INR: 1 (ref 0.8–1.2)
Prothrombin Time: 13.8 s (ref 11.4–15.2)

## 2023-11-04 MED ORDER — POLYETHYLENE GLYCOL 3350 17 G PO PACK: 17.0000 g | PACK | Freq: Every day | ORAL | Status: DC | PRN

## 2023-11-04 MED ORDER — IOHEXOL 350 MG/ML SOLN
100.0000 mL | Freq: Once | INTRAVENOUS | Status: AC | PRN
  Administered 2023-11-04: 75 mL via INTRAVENOUS

## 2023-11-04 MED ORDER — NICARDIPINE HCL IN NACL 20-0.86 MG/200ML-% IV SOLN
3.0000 mg/h | INTRAVENOUS | Status: DC
  Administered 2023-11-04: 5 mg/h via INTRAVENOUS
  Administered 2023-11-04: 3 mg/h via INTRAVENOUS
  Filled 2023-11-04: qty 200

## 2023-11-04 MED ORDER — ACETAMINOPHEN 650 MG RE SUPP: 650.0000 mg | Freq: Four times a day (QID) | RECTAL | Status: DC | PRN

## 2023-11-04 MED ORDER — LEVETIRACETAM IN NACL 500 MG/100ML IV SOLN
500.0000 mg | Freq: Two times a day (BID) | INTRAVENOUS | Status: DC
  Administered 2023-11-04 – 2023-11-05 (×2): 500 mg via INTRAVENOUS
  Filled 2023-11-04 (×2): qty 100

## 2023-11-04 MED ORDER — LEVETIRACETAM IN NACL 1000 MG/100ML IV SOLN
1000.0000 mg | Freq: Once | INTRAVENOUS | Status: AC
  Administered 2023-11-04: 1000 mg via INTRAVENOUS
  Filled 2023-11-04: qty 100

## 2023-11-04 MED ORDER — SODIUM CHLORIDE 0.9% FLUSH
3.0000 mL | Freq: Two times a day (BID) | INTRAVENOUS | Status: DC
  Administered 2023-11-04 – 2023-11-06 (×4): 3 mL via INTRAVENOUS

## 2023-11-04 MED ORDER — LABETALOL HCL 5 MG/ML IV SOLN
5.0000 mg | INTRAVENOUS | Status: DC | PRN
  Administered 2023-11-04 – 2023-11-05 (×3): 5 mg via INTRAVENOUS
  Filled 2023-11-04 (×3): qty 4

## 2023-11-04 MED ORDER — ACETAMINOPHEN 325 MG PO TABS: 650.0000 mg | ORAL_TABLET | Freq: Four times a day (QID) | ORAL | Status: DC | PRN

## 2023-11-04 MED ORDER — LEVOTHYROXINE SODIUM 50 MCG PO TABS
50.0000 ug | ORAL_TABLET | Freq: Every day | ORAL | Status: DC
Start: 2023-11-05 — End: 2023-11-06
  Administered 2023-11-05 – 2023-11-06 (×2): 50 ug via ORAL
  Filled 2023-11-04 (×2): qty 1

## 2023-11-04 MED ORDER — TIMOLOL MALEATE 0.5 % OP SOLN
1.0000 [drp] | Freq: Every day | OPHTHALMIC | Status: DC
  Administered 2023-11-05 – 2023-11-06 (×2): 1 [drp] via OPHTHALMIC
  Filled 2023-11-04: qty 5

## 2023-11-04 MED ORDER — HYDRALAZINE HCL 20 MG/ML IJ SOLN
10.0000 mg | Freq: Once | INTRAMUSCULAR | Status: AC
  Administered 2023-11-04: 10 mg via INTRAVENOUS
  Filled 2023-11-04: qty 1

## 2023-11-04 MED ORDER — ORAL CARE MOUTH RINSE: 15.0000 mL | OROMUCOSAL | Status: DC | PRN

## 2023-11-04 MED ORDER — SODIUM CHLORIDE 0.9% FLUSH
3.0000 mL | Freq: Once | INTRAVENOUS | Status: AC
  Administered 2023-11-04: 3 mL via INTRAVENOUS

## 2023-11-04 NOTE — Progress Notes (Signed)
Pt and wife back from MRI.

## 2023-11-04 NOTE — ED Triage Notes (Signed)
PT reports reaching to grab car door yesterday around 5:30pm and feeling numbness in fingers. Later in evening patient reports having issues with strength in right and and frequently dropping items. Pt reports sensation is same on both sides at this time. Pt states son said he speech sounded a bit slurred yesterday afternoon. LKW 4:30-5:00pm per patient. No obvious slurring noted in triage but wife states speech is different than normal. No confusion noted. No obvious unilateral weakness noted.

## 2023-11-04 NOTE — ED Notes (Signed)
-  Activate Code Stroke via Tele-Cart at 108am.

## 2023-11-04 NOTE — Progress Notes (Signed)
0108: Tele stroke cart activated at this time.   0112: TSP paged at this time.   0115: Pt to CT.   0122: TSP on cart at this time.   0130: Pt returned from CT.  0145: TSP and TSRN off cart at this time. TSP to follow up with EDP for plan of care.

## 2023-11-04 NOTE — ED Provider Notes (Signed)
Ronald Kelley AT [facility] Provider Note   CSN: [id number] Arrival date & time: 11/04/23  0018     History  Chief Complaint  Patient presents with   Numbness   Aphasia    Ronald Kelley is a 82 y.o. male.  Patient is an 82 year old male with past medical history of paroxysmal A-fib, GERD, type 2 diabetes, BPH, hyperlipidemia.  Patient presenting today for evaluation of right arm weakness, numbness, and slurred speech.  This started acutely at approximately 530 this afternoon as patient was eating dinner.  Symptoms have persisted, now presents for evaluation of them.  He denies any injury or trauma.  His wife feels as though his speech is not normal.  The history is provided by the patient.       Home Medications Prior to Admission medications   Medication Sig Start Date End Date Taking? Authorizing Provider  Acetaminophen (TYLENOL) 325 MG CAPS     [provider]  Ascorbic Acid (VITAMIN C) 100 MG tablet Take 100 mg by mouth daily.    [provider]  ASPIRIN 81 PO Take 81 mg by mouth daily at 6 (six) AM. I took 2 this morning after injury.    [provider]  Cholecalciferol (VITAMIN D3) 125 MCG (5000 UT) TABS 5,000 IU OTC vitamin D3 daily. 08/31/19   Thomasene Lot, DO  levothyroxine (SYNTHROID) 50 MCG tablet TAKE 1 TABLET BY MOUTH EVERY DAY BEFORE BREAKFAST 09/14/23   [name], MD  meloxicam (MOBIC) 7.5 MG tablet Take 7.5 mg by mouth daily. 04/28/23   [provider]  timolol (TIMOPTIC) 0.5 % ophthalmic solution Place 1 drop into both eyes daily. 07/27/22   [provider]  traMADol (ULTRAM) 50 MG tablet Take 1 tablet (50 mg total) by mouth every 6 (six) hours as needed for moderate pain. 01/22/23   Darnell Level, MD  traMADol (ULTRAM) 50 MG tablet Take 1 tablet by mouth every 12 (twelve) hours.    [provider]  valsartan (DIOVAN) 40 MG tablet Take 1 tablet (40 mg total) by mouth daily.  09/28/23   [name], MD  Vibegron (GEMTESA) 75 MG TABS Take 75 mg by mouth daily at 6 (six) AM.    [provider]  Zinc Sulfate (ZINC 15 PO) Take 1 tablet by mouth daily.    [provider]      Allergies    Hydrocodone-acetaminophen, Oxycodone, and Oxycodone-acetaminophen    Review of Systems   Review of Systems  All other systems reviewed and are negative.   Physical Exam Updated Vital Signs BP (!) 198/75   Pulse (!) 57   Temp 98.1 F (36.7 C)   Resp 18   Ht 5' 0.5" (1.537 m)   Wt 71.7 kg   SpO2 100%   BMI 30.35 kg/m  Physical Exam Vitals and nursing note reviewed.  Constitutional:      General: He is not in acute distress.    Appearance: He is well-developed. He is not diaphoretic.  HENT:     Head: Normocephalic and atraumatic.  Cardiovascular:     Rate and Rhythm: Normal rate and regular rhythm.     Heart sounds: No murmur heard.    No friction rub.  Pulmonary:     Effort: Pulmonary effort is normal. No respiratory distress.     Breath sounds: Normal breath sounds. No wheezing or rales.  Abdominal:     General: Bowel sounds are normal. There is  no distension.     Palpations: Abdomen is soft.     Tenderness: There is no abdominal tenderness.  Musculoskeletal:        General: Normal range of motion.     Cervical back: Normal range of motion and neck supple.  Skin:    General: Skin is warm and dry.  Neurological:     Mental Status: He is alert and oriented to person, place, and time.     Cranial Nerves: No cranial nerve deficit.     Coordination: Coordination normal.     Comments: Strength is 4+ out of 5 in the right upper extremity and 5 out of 5 in the left upper extremity.  There is a slight right facial droop noted along with mildly slurred speech.     ED Results / Procedures / Treatments   Labs (all labs ordered are listed, but only abnormal results are displayed) Labs Reviewed  CBC - Abnormal; Notable for the following  components:      Result Value   WBC 11.1 (*)    All other components within normal limits  DIFFERENTIAL - Abnormal; Notable for the following components:   Monocytes Absolute 1.1 (*)    All other components within normal limits  CBG MONITORING, ED - Abnormal; Notable for the following components:   Glucose-Capillary 147 (*)    All other components within normal limits  PROTIME-INR  APTT  COMPREHENSIVE METABOLIC PANEL  ETHANOL    EKG None  Radiology No results found.  Procedures Procedures    Medications Ordered in ED Medications  sodium chloride flush (NS) 0.9 % injection 3 mL (3 mLs Intravenous Given 11/04/23 0055)    ED Course/ Medical Decision Making/ A&P  Patient is a an 82 year old male presenting with complaints of right hand weakness and slurred speech as described in the HPI.  This started earlier this evening while having dinner.  Patient arrives here with stable vital signs and is afebrile.  He does have a reduction in grip strength of his right hand, a slight right-sided facial droop, but notes speech which is slightly slurred.  Workup initiated including CBC, metabolic panel, INR, all of which are basically unremarkable.  CT scan of the head showing a small focus of acute subarachnoid hemorrhage over the left convexity.  When patient arrived to the ER and was evaluated by myself, I elected to initiate a code stroke due to the nature of his symptoms.  Patient went for CT scan and the above results were obtained.  This was discussed with the teleneurologist who has recommended blood pressure control, IV Keppra, and CT angiogram of the head.  They have also recommended admission.  I initially spoke with Dr. Derry Lory from neurology.  Given the subarachnoid nature of his bleed, he informed me that this was a neurosurgical issue.  I then spoke with the PA, Paticia Stack from neurosurgery.  They have recommended admission to the hospitalist.  Initially, Dr. Arlean Hopping  recommended admission to the intensive care unit as patient had been started on Cardene for blood pressure control.  In further discussion with Dr. Gaynell Face from critical care, it was decided that we would titrate back the Cardene drip and see if his blood pressure maintained in the desired range of 1 30-1 40 systolic.  This medication was ultimately stopped and blood pressures have remained in the 120s to 130s.  Patient now will be admitted to the hospitalist service.  CRITICAL CARE Performed by: Geoffery Lyons Total critical care  time: 45 minutes Critical care time was exclusive of separately billable procedures and treating other patients. Critical care was necessary to treat or prevent imminent or life-threatening deterioration. Critical care was time spent personally by me on the following activities: development of treatment plan with patient and/or surrogate as well as nursing, discussions with consultants, evaluation of patient's response to treatment, examination of patient, obtaining history from patient or surrogate, ordering and performing treatments and interventions, ordering and review of laboratory studies, ordering and review of radiographic studies, pulse oximetry and re-evaluation of patient's condition.   Final Clinical Impression(s) / ED Diagnoses Final diagnoses:  None    Rx / DC Orders ED Discharge Orders     None         Geoffery Lyons, MD 11/04/23 [phone]

## 2023-11-04 NOTE — Progress Notes (Signed)
Pt and wife off unit for MRI.

## 2023-11-04 NOTE — ED Notes (Signed)
Pt can eat per EDP Ronald Kelley

## 2023-11-04 NOTE — Progress Notes (Addendum)
Pt arrived onto [facility]07 from [facility] ED via carelink. A&O x 4 with no pain. Vitals recorded and assessment completed. Notified [facility] admissions. Alinda Money MD was paged. Messaged MD about pt's 179/82 (110) BP. Labetalol was ordered. Call bell within reach, floors mats placed d/t prior fall history, and bed alarm on. Wife, son, and personal belongings (clothes) are at bedside.

## 2023-11-04 NOTE — Consult Note (Signed)
[facility] Tele[facility]urology Consult Services   Patient Name:   Ronald Kelley, Ronald Kelley Date of Birth:   October 31, 1941 Identification Number:   MRN - [id number] Date of Service:   11/04/2023 01:12:29  Diagnosis:       I60.9 - Subarachnoid haemorrhage, unspecified  Impression: 82 year old man here with R hand numbness, and later developed slurred speech and a facial droop. Last known well 1730. He has a bruise on his face ,R side he had a fall two weeks prior from bed and hit the right side of his body. On exam, he has R eye hemianopia, facial palsy, sensory change, and slurred speech on exam. Not a candidate for thrombolytics, given time from onset > 4.5h and SAH on CT head. SBP goal < 130, use icardipine drip if needed. Repeat CT head 4-6h. Keppra 1 GM load then 500mg  bid. MR brain, tele. Neurology following along.  Recommendation:  Diagnostic Studies:      Repeat CT head in first 8-12hrs      CTA head and neck with contrast  Laboratory Studies:       INR/PT       aPTT?       CBC  Medications:       Load with Keppra 1gm now.       Keppra 500mg  bid.  Nursing Recommendations:       Telemetry, IV Fluids?Avoid dextrose containing fluids, Maintain euglycemia       Head of bed 30 degrees       Neuro checks q1-2?hrs?during ICU stay       Once stable neuro checks q4?hrs       keep BP less than 140/90's with goal of 130/80s  Consultations:       Need Neurosurgery consultation?STAT       Recommend Speech therapy if failed dysphagia screen       Physical therapy/Occupational therapy  Disposition:       Neurology will Follow  ------------------------------------------------------------------------------  Metrics: Last Known Well: 11/03/2023 17:30:00 Dispatch Time: 11/04/2023 01:12:29 Arrival Time: 11/04/2023 00:18:00 Initial Response Time: 11/04/2023 01:13:22 Symptoms: R hand numbness. Initial patient interaction: 11/04/2023 01:31:25 NIHSS Assessment Completed:  11/04/2023 01:39:09 Patient is not a candidate for Thrombolytic. Thrombolytic Medical Decision: 11/04/2023 01:39:12 Patient was not deemed candidate for Thrombolytic because of following reasons: LKW outside 4.5 hr window. .  I personally Reviewed the CT Head and it Showed, small focus on acute subarachnoid hemorrhage over the L focus.  Primary Provider Notified of Diagnostic Impression and Management Plan on: 11/04/2023 01:46:33    History of Present Illness: Patient is a 82 year old Male.  Patient was brought by private transportation with symptoms of R hand numbness. 82 year old man here with R hand numbness, and later developed slurred speech and a facial droop. Last known well 1730. He has a bruise on his face ,R side he had a fall two weeks prior from bed and hit the right side of his body. On exam, he has R eye hemianopia, facial palsy, sensory change, and slurred speech on exam. Not a candidate for thrombolytics, given time from onset > 4.5h and SAH on CT head.   Past Medical History:      Hypertension      Diabetes Mellitus  Medications:  No Anticoagulant use  No Antiplatelet use Reviewed EMR for current medications  Allergies:  Reviewed  Social History: Smoking: No Alcohol Use: No  Family History:  There Is Family History LK:GMWN There is no family  history of premature cerebrovascular disease pertinent to this consultation  ROS : 14 Points Review of Systems was performed and was negative except mentioned in HPI.  Past Surgical History: There Is No Surgical History Contributory To Today's Visit    Examination: BP(160/73), Pulse(57), Blood Glucose(140) 1A: Level of Consciousness - Alert; keenly responsive + 0 1B: Ask Month and Age - Both Questions Right + 0 1C: Blink Eyes & Squeeze Hands - Performs Both Tasks + 0 2: Test Horizontal Extraocular Movements - Normal + 0 3: Test Visual Fields - Complete Hemianopia + 2 4: Test Facial Palsy (Use Grimace if  Obtunded) - Partial paralysis (lower face) + 2 5A: Test Left Arm Motor Drift - No Drift for 10 Seconds + 0 5B: Test Right Arm Motor Drift - No Drift for 10 Seconds + 0 6A: Test Left Leg Motor Drift - No Drift for 5 Seconds + 0 6B: Test Right Leg Motor Drift - No Drift for 5 Seconds + 0 7: Test Limb Ataxia (FNF/Heel-Shin) - No Ataxia + 0 8: Test Sensation - Mild-Moderate Loss: Less Sharp/More Dull + 1 9: Test Language/Aphasia - Normal; No aphasia + 0 10: Test Dysarthria - Mild-Moderate Dysarthria: Slurring but can be understood + 1 11: Test Extinction/Inattention - No abnormality + 0 NIHSS Score: 6  ICH Score:   Hunt and Huss score: 1 Hunt and Huss score time:   11/04/2023 03:01:09        Mild Headache, Alert and Oriented, Minimal (if any) Nuchal Rigidity (+1)   Pre-Morbid Modified Rankin Scale: 0 Points = No symptoms at all   This consult was conducted in real time using interactive audio and Immunologist. Patient was informed of the technology being used for this visit and agreed to proceed. Patient located in hospital and provider located at home/office setting.  Due to the immediate potential for life-threatening deterioration due to underlying acute neurologic illness, I spent 35 minutes providing critical care. This time includes time for face to face visit via telemedicine, review of medical records, imaging studies and discussion of findings with providers, the patient and/or family.  Dr Stefani Dama  [facility] For Inpatient follow-up with [facility] physician please call [facility] at [phone]. As we are not an outpatient service for any post hospital discharge needs please contact the hospital for assistance. If you have any questions for the [facility] physicians or need to reconsult for clinical or diagnostic changes please contact us via [facility] at [phone].

## 2023-11-04 NOTE — Progress Notes (Incomplete)
NEUROLOGY CONSULT NOTE   Date of service: November 04, 2023 Patient Name: Ronald Kelley MRN:  [id number] DOB:  04/27/1941 Chief Complaint: "strokes" Requesting Provider: Synetta Fail, MD  History of Present Illness  Ronald Kelley is a 82 y.o. male with afibb not on AC, DM2, GERD, BPH, OSA, hyperparathyroidism p/w R sided weakness and slurred speech.  Initially presented to [facility] ED where he was evaluated emergently by teleneurology as a code stroke. He was noted to have R hemianopsia, R facial palsy, sensory changes and slrred speech. CT Head demonstrated SAH and repeat CT Head stable. CTA with no aneurysm, CTA with no LVO, no aneurysms. He had MRI Brain which demonstrated ***     LKW: *** Modified rankin score: {Modified Rankin Scale:21264} IV Thrombolysis: ***Yes, *** No (reason) EVT: ***Yes, *** No (reason) ICH Score:***  NIHSS components Score: Comment  1a Level of Conscious 0[]  1[]  2[]  3[]      1b LOC Questions 0[]  1[]  2[]       1c LOC Commands 0[]  1[]  2[]       2 Best Gaze 0[]  1[]  2[]       3 Visual 0[]  1[]  2[]  3[]      4 Facial Palsy 0[]  1[]  2[]  3[]      5a Motor Arm - left 0[]  1[]  2[]  3[]  4[]  UN[]    5b Motor Arm - Right 0[]  1[]  2[]  3[]  4[]  UN[]    6a Motor Leg - Left 0[]  1[]  2[]  3[]  4[]  UN[]    6b Motor Leg - Right 0[]  1[]  2[]  3[]  4[]  UN[]    7 Limb Ataxia 0[]  1[]  2[]  3[]  UN[]     8 Sensory 0[]  1[]  2[]  UN[]      9 Best Language 0[]  1[]  2[]  3[]      10 Dysarthria 0[]  1[]  2[]  UN[]      11 Extinct. and Inattention 0[]  1[]  2[]       TOTAL:       ROS  ***Comprehensive ROS performed and pertinent positives documented in HPI  ***Unable to ascertain due to ***  Past History   Past Medical History:  Diagnosis Date   Atrial fib/flutter, transient (HCC)    Degenerative disk disease    Diabetes mellitus    a1c 6.5, 07/2008   GERD (gastroesophageal reflux disease)    History of inguinal hernia repair, bilateral 03/23/2019   History of kidney stones    History of vitamin D  deficiency 09/27/2016   Hyperthyroidism    remote h/o , no ablation   OSA on CPAP     Past Surgical History:  Procedure Laterality Date   HERNIA REPAIR  2010   L5 acute HNP     s/p surgery 09/21/08-- still has occasional paretheisa of the left foot    PARATHYROIDECTOMY Right 01/22/2023   Procedure: RIGHT INFERIOR PARATHYROIDECTOMY;  Surgeon: Darnell Level, MD;  Location: [facility];  Service: General;  Laterality: Right;   ROTATOR CUFF REPAIR      Family History: Family History  Problem Relation Age of Onset   Hypertension Mother    Diabetes Brother    Alzheimer's disease Father 18   Coronary artery disease Neg Hx    Stroke Neg Hx    Colon cancer Neg Hx    Prostate cancer Neg Hx     Social History  reports that he has never smoked. He has never been exposed to tobacco smoke. He has never used smokeless tobacco. He reports that he does not drink alcohol and does not use drugs.  Allergies  Allergen Reactions   Hydrocodone-Acetaminophen Other (See Comments)    REACTION: atrial fibrilation   Oxycodone Other (See Comments)    Atrial fibrilation   Oxycodone-Acetaminophen Other (See Comments)    REACTION: atrial fibrilation   ok Darvocet    Medications   Current Facility-Administered Medications:    acetaminophen (TYLENOL) tablet 650 mg, 650 mg, Oral, Q6H PRN **OR** acetaminophen (TYLENOL) suppository 650 mg, 650 mg, Rectal, Q6H PRN, Synetta Fail, MD   labetalol (NORMODYNE) injection 5 mg, 5 mg, Intravenous, Q2H PRN, Synetta Fail, MD, 5 mg at 2023-12-03 1538   levETIRAcetam (KEPPRA) IVPB 500 mg/100 mL premix, 500 mg, Intravenous, BID, Synetta Fail, MD   [START ON 11/05/2023] levothyroxine (SYNTHROID) tablet 50 mcg, 50 mcg, Oral, Q0600, Synetta Fail, MD   Oral care mouth rinse, 15 mL, Mouth Rinse, PRN, Synetta Fail, MD   polyethylene glycol (MIRALAX / GLYCOLAX) packet 17 g, 17 g, Oral, Daily PRN, Synetta Fail, MD   sodium chloride flush  (NS) 0.9 % injection 3 mL, 3 mL, Intravenous, Q12H, Synetta Fail, MD   [START ON 11/05/2023] timolol (TIMOPTIC) 0.5 % ophthalmic solution 1 drop, 1 drop, Both Eyes, Daily, Synetta Fail, MD  Vitals   Vitals:   12/03/2023 1305 12/03/2023 1422 2023-12-03 1535 Dec 03, 2023 1600  BP:  (!) 179/82 (!) 153/77 (!) 146/74  Pulse:  75 60 (!) 57  Resp:  19 (!) 23 12  Temp: 98.4 F (36.9 C) 97.7 F (36.5 C)    TempSrc: Oral Oral    SpO2:  96% 94% 95%  Weight:      Height:        Body mass index is 30.35 kg/m.  Physical Exam   Constitutional: Appears well-developed and well-nourished. *** Psych: Affect appropriate to situation. *** Eyes: No scleral injection. *** HENT: No OP obstruction. *** Head: Normocephalic. *** Cardiovascular: Normal rate and regular rhythm. *** Respiratory: Effort normal, non-labored breathing. *** GI: Soft.  No distension. There is no tenderness. *** Skin: WDI. ***  Neurologic Examination   ***  Labs/Imaging/Neurodiagnostic studies   CBC:  Recent Labs  Lab 2023/12/03 0037 12/03/2023 1523  WBC 11.1* 8.8  NEUTROABS 6.0  --   HGB 13.9 15.4  HCT 41.8 46.5  MCV 92.3 92.6  PLT 161 112*   Basic Metabolic Panel:  Lab Results  Component Value Date   NA 140 12-03-2023   K 3.6 03-Dec-2023   CO2 29 December 03, 2023   GLUCOSE 131 (H) 12-03-2023   BUN 25 (H) 12/03/23   CREATININE 1.00 12-03-23   CALCIUM 9.7 12/03/23   GFRNONAA >60 12-03-2023   GFRAA 77 12/31/2020   Lipid Panel:  Lab Results  Component Value Date   LDLCALC 87 12/31/2020   HgbA1c:  Lab Results  Component Value Date   HGBA1C 7.6 (H) 09/28/2023   Urine Drug Screen: No results found for: "LABOPIA", "COCAINSCRNUR", "LABBENZ", "AMPHETMU", "THCU", "LABBARB"  Alcohol Level     Component Value Date/Time   ETH <10 2023-12-03 0037   INR  Lab Results  Component Value Date   INR 1.0 12/03/23   APTT  Lab Results  Component Value Date   APTT 28 12-03-23   AED levels: No results  found for: "PHENYTOIN", "ZONISAMIDE", "LAMOTRIGINE", "LEVETIRACETA"  CT Head without contrast(Personally reviewed): ***  CT angio Head and Neck with contrast(Personally reviewed): ***  MR Angio head without contrast and Carotid Duplex BL(Personally reviewed): ***  MRI Brain(Personally reviewed): ***  Neurodiagnostics rEEG:  ***  ASSESSMENT   Ronald Kelley is a 82 y.o. male  has a past medical history of Atrial fib/flutter, transient (HCC), Degenerative disk disease, Diabetes mellitus, GERD (gastroesophageal reflux disease), History of inguinal hernia repair, bilateral (03/23/2019), History of kidney stones, History of vitamin D deficiency (09/27/2016), Hyperthyroidism, and OSA on CPAP. ***  RECOMMENDATIONS  *** ______________________________________________________________________    Welton Flakes, MD [facility]

## 2023-11-04 NOTE — Progress Notes (Signed)
Hospitalist Transfer Note:    Nursing staff, Please call [facility] Admits & Consults System-Wide number on Amion [phone]) as soon as patient's arrival, so appropriate admitting provider can evaluate the pt.   Transferring facility: [facility] Requesting provider: Dr. Judd Lien (EDP at [facility]) Reason for transfer: admission for further evaluation and management of acute subarachnoid hemorrhage.   56 M w/ h/o paroxysmal Afib, DM2,  who presented to [facility] ED complaining of acute onset of numbness and weakness involving the right upper extremity, as well as some slurred speech, starting at 1830 on 11/03/2023 while out to dinner with his son.  No reported recent trauma.  While he does have a history of paroxysmal atrial fibrillation, he has not reported to be chronically anticoagulated.  Rather, he is on a daily baby aspirin.  He does report remedy numbness/weakness as well as his slurred speech subsequently resolved in spontaneous fashion, and he is currently without reported acute focal neurologic deficit.  Vital signs in the ED were notable for the following: Afebrile; initial systolic blood pressures in the 190s.  He was treated briefly with Cardene drip, and has now been off of Cardene drip with most recent blood pressures in the 120s to 140s mmHg, heart rates in the 70s to 80s.   Labs were notable for CBC which showed Hgb 13.9.   Initially, the patient presented to [facility] as a code stroke given the above presentation, prompting evaluation by telestroke neurology . imaging notable for CT head which showed a small left convexity subarachnoid hemorrhage.  EDP at [facility] has d/w on-call neurosurgery, Patrici Ranks, PA, who conveyed no need for urgent neurosurgical intervention, recommended [facility] admit to [facility], and conveyed that neurosurgery will formally consult and see the patient in the morning. Additionally, neurosurgery recommends CTA head (ordered), prophylactic Keppra 500 mg p.o. twice daily x 1 week, as well as  goal systolic blood pressure less than 150  mmHg.   Additionally, EDP d/w on-call PCCM: who recommended prn iv hydralazine or prn iv labetalol in order to maintain goal systolic blood pressure less than 150 mmHg, and felt comfortable with admission to [facility] service.    Subsequently, I accepted this patient for transfer for inpatient admission to a pcu bed at [facility] for further work-up and management of the above.      Newton Pigg, DO Hospitalist

## 2023-11-04 NOTE — H&P (Signed)
History and Physical   Ronald Kelley:[id number] DOB: [birth year] DOA: 11/04/2023  PCP: [name], MD   Patient coming from: Home  Chief Complaint: Weakness, slurred speech  HPI: Ronald Kelley is a 82 y.o. male with medical history significant of hypertension, hyperlipidemia, diabetes, BPH, GERD, paroxysmal defibrillation, OSA, hyperparathyroidism presenting with right-sided weakness and slurred speech.  Patient presented after new onset right-sided arm weakness and numbness with some associated slurred speech noted around 5:30 PM yesterday afternoon at dinnertime.  Symptoms persisted and so he came to the ED for further evaluation.  Did have recent fall where he hit his head. He hit the right side of his head and CT at that time looked okay. "11/8"  Denies fevers, chills, chest pain, shortness of breath, abdominal pain, constipation, diarrhea, nausea, vomiting.  ED Course: Vital signs in the ED notable for blood pressure widely ranging from the 80s to 190s systolic.  In the setting of needing blood pressure medications for tighter blood pressure control in the setting of subarachnoid hemorrhage.  Also noted to have lab workup with CMP showing BUN 25, glucose 131, T. bili 1.3.  CBC with mild leukocytosis to 11.1.  PT, PTT, INR within normal limits.  CT head at 00:45 showed small acute subarachnoid hemorrhage in the left.  CT angiogram of the head at 05 100 showed grossly stable left middle gyrus subarachnoid hemorrhage, and noted some moderate stenosis of the left P2, 3, 4 arteries.  Patient received hydralazine IV x 1 in the ED and a dose of IV Keppra.  Neurology initially consulted who based on patient's etiology being subarachnoid hemorrhage who recommended neurosurgical consultation.  Neurosurgery recommended admission, Keppra for seizure prophylaxis, systolic BP less than 150, and that they will consult.  Patient initially on Cardene infusion which was weaned off in consultation with  critical care in the ED.  Patient then accepted for admission to progressive unit.  Review of Systems: As per HPI otherwise all other systems reviewed and are negative.  Past Medical History:  Diagnosis Date   Atrial fib/flutter, transient (HCC)    Degenerative disk disease    Diabetes mellitus    a1c 6.5, 07/2008   GERD (gastroesophageal reflux disease)    History of inguinal hernia repair, bilateral 03/23/2019   History of kidney stones    History of vitamin D deficiency 09/27/2016   Hyperthyroidism    remote h/o , no ablation   OSA on CPAP     Past Surgical History:  Procedure Laterality Date   HERNIA REPAIR  2010   L5 acute HNP     s/p surgery 09/21/08-- still has occasional paretheisa of the left foot    PARATHYROIDECTOMY Right 01/22/2023   Procedure: RIGHT INFERIOR PARATHYROIDECTOMY;  Surgeon: Darnell Level, MD;  Location: [facility];  Service: General;  Laterality: Right;   ROTATOR CUFF REPAIR      Social History  reports that he has never smoked. He has never been exposed to tobacco smoke. He has never used smokeless tobacco. He reports that he does not drink alcohol and does not use drugs.  Allergies  Allergen Reactions   Hydrocodone-Acetaminophen Other (See Comments)    REACTION: atrial fibrilation   Oxycodone Other (See Comments)    Atrial fibrilation   Oxycodone-Acetaminophen Other (See Comments)    REACTION: atrial fibrilation   ok Darvocet    Family History  Problem Relation Age of Onset   Hypertension Mother    Diabetes Brother  Alzheimer's disease Father 32   Coronary artery disease Neg Hx    Stroke Neg Hx    Colon cancer Neg Hx    Prostate cancer Neg Hx   Reviewed on admission  Prior to Admission medications   Medication Sig Start Date End Date Taking? Authorizing Provider  Acetaminophen (TYLENOL) 325 MG CAPS Take 650 mg by mouth as needed.   Yes [provider]  Ascorbic Acid (VITAMIN C) 100 MG tablet Take 100 mg by mouth daily.   Yes  [provider]  Cholecalciferol (VITAMIN D3) 125 MCG (5000 UT) TABS 5,000 IU OTC vitamin D3 daily. Patient taking differently: See admin instructions. 5,000 IU OTC vitamin D3 combined with magnesium tab daily 08/31/19  Yes Opalski, Deborah, DO  levothyroxine (SYNTHROID) 50 MCG tablet TAKE 1 TABLET BY MOUTH EVERY DAY BEFORE BREAKFAST 09/14/23  Yes [name], MD  timolol (TIMOPTIC) 0.5 % ophthalmic solution Place 1 drop into both eyes daily. 07/27/22  Yes [provider]  Zinc Sulfate (ZINC 15 PO) Take 1 tablet by mouth daily.   Yes [provider]  valsartan (DIOVAN) 40 MG tablet Take 1 tablet (40 mg total) by mouth daily. Patient not taking: Reported on 11/04/2023 09/28/23   [name], MD    Physical Exam: Vitals:   11/04/23 1200 11/04/23 1300 11/04/23 1305 11/04/23 1422  BP: 120/63 (!) 152/78  (!) 179/82  Pulse: 62   75  Resp: 16 19  19   Temp:   98.4 F (36.9 C) 97.7 F (36.5 C)  TempSrc:   Oral Oral  SpO2: 98% 98%  96%  Weight:      Height:        Physical Exam Constitutional:      General: He is not in acute distress.    Appearance: Normal appearance.  HENT:     Head: Normocephalic and atraumatic.     Mouth/Throat:     Mouth: Mucous membranes are moist.     Pharynx: Oropharynx is clear.  Eyes:     Extraocular Movements: Extraocular movements intact.     Pupils: Pupils are equal, round, and reactive to light.  Cardiovascular:     Rate and Rhythm: Normal rate and regular rhythm.     Pulses: Normal pulses.     Heart sounds: Normal heart sounds.  Pulmonary:     Effort: Pulmonary effort is normal. No respiratory distress.     Breath sounds: Normal breath sounds.  Abdominal:     General: Bowel sounds are normal. There is no distension.     Palpations: Abdomen is soft.     Tenderness: There is no abdominal tenderness.  Musculoskeletal:        General: No swelling or deformity.  Skin:    General: Skin is warm and dry.  Neurological:      Comments: Mental Status: Patient is awake, alert, oriented  No signs of aphasia or neglect Cranial Nerves: II: Pupils equal, round, and reactive to light.   III,IV, VI: EOMI without ptosis or diploplia.  V: Facial sensation is symmetric to light touch. VII: Facial movement is symmetric.  VIII: hearing is intact to voice X: Uvula elevates symmetrically XI: Shoulder shrug is symmetric. XII: tongue is midline without atrophy or fasciculations.  Motor: Good effort thorughout, at Least 5/5 LUE and LLE.  4-5/5R UE and RLE.  Sensory: Sensation is decreased in right upper extremity Cerebellar: Finger-Nose slowed at right upper extremity due to strength and sensation deficits there.  Labs on Admission: I have personally reviewed following labs and imaging studies  CBC: Recent Labs  Lab 11/04/23 0037  WBC 11.1*  NEUTROABS 6.0  HGB 13.9  HCT 41.8  MCV 92.3  PLT 161    Basic Metabolic Panel: Recent Labs  Lab 11/04/23 0037  NA 140  K 3.6  CL 102  CO2 29  GLUCOSE 131*  BUN 25*  CREATININE 1.00  CALCIUM 9.7    GFR: Estimated Creatinine Clearance: 47 mL/min (by C-G formula based on SCr of 1 mg/dL).  Liver Function Tests: Recent Labs  Lab 11/04/23 0037  AST 14*  ALT 14  ALKPHOS 82  BILITOT 1.3*  PROT 7.4  ALBUMIN 4.2    Urine analysis:    Component Value Date/Time   COLORURINE YELLOW 07/31/2017 1426   APPEARANCEUR HAZY (A) 07/31/2017 1426   LABSPEC 1.018 07/31/2017 1426   PHURINE 6.0 07/31/2017 1426   GLUCOSEU NEGATIVE 07/31/2017 1426   HGBUR MODERATE (A) 07/31/2017 1426   HGBUR negative 12/31/2009 1053   BILIRUBINUR Negative 05/05/2023 1325   KETONESUR 5 (A) 07/31/2017 1426   PROTEINUR Positive (A) 05/05/2023 1325   PROTEINUR NEGATIVE 07/31/2017 1426   UROBILINOGEN 0.2 05/05/2023 1325   UROBILINOGEN 1.0 09/05/2012 1512   NITRITE Negative 05/05/2023 1325   NITRITE NEGATIVE 07/31/2017 1426   LEUKOCYTESUR Negative 05/05/2023 1325    Radiological  Exams on Admission: CT Angio Head W or Wo Contrast  Result Date: 11/04/2023 CLINICAL DATA:  82 year old male code stroke presentation, right hand weakness. Left convexity subarachnoid hemorrhage on plain head CT. EXAM: CT ANGIOGRAPHY HEAD TECHNIQUE: Multidetector CT imaging of the head was performed using the standard protocol during bolus administration of intravenous contrast. Multiplanar CT image reconstructions and MIPs were obtained to evaluate the vascular anatomy. RADIATION DOSE REDUCTION: This exam was performed according to the departmental dose-optimization program which includes automated exposure control, adjustment of the mA and/or kV according to patient size and/or use of iterative reconstruction technique. CONTRAST:  75mL OMNIPAQUE IOHEXOL 350 MG/ML SOLN COMPARISON:  Plain head CT 0125 hours today. FINDINGS: Posterior circulation: Patent distal vertebral arteries, fairly codominant. No significant distal vertebral plaque or stenosis and patent vertebrobasilar junction. Both PICA origins appear patent and normal. Patent basilar artery without stenosis. Normal SCA and PCA origins. Posterior communicating arteries are diminutive or absent. Right PCA branches are within normal limits. On the left there is moderate proximal P2 irregularity and stenosis (series 8, image 20), and moderate P3/P4 segment irregularity and stenosis on the left (series 13, image 25) with preserved distal enhancement. Anterior circulation: Distal cervical ICAs are patent without stenosis. Mild distal left ICA tortuosity below the skull base. Left ICA siphon is patent with no significant plaque or stenosis. Right siphon is patent with no significant plaque or stenosis. Normal ophthalmic artery origins. Patent carotid termini. Normal MCA and ACA origins. Normal anterior communicating artery. Bilateral ACA branches are within normal limits. Right MCA M1 segment and trifurcation are patent without stenosis. Right MCA branches are  within normal limits. Left MCA M1 segment and bifurcation are patent without stenosis. Simplified left MCA branching pattern. No left MCA branch occlusion is identified and left MCA branches are within normal limits. Venous sinuses: Early contrast timing, not well evaluated. Anatomic variants: None. Other findings: Trace left middle frontal gyrus subarachnoid hemorrhage grossly stable on series 6, image 53 and series 10, image 105. No discrete abnormal vessels there. No intracranial mass effect or ventriculomegaly. Review of the MIP images confirms the above  findings IMPRESSION: 1. Negative for age CTA of the anterior circulation. Left MCA branches are within normal limits. No large vessel occlusion or intracranial aneurysm identified. 2. Trace left middle frontal gyrus subarachnoid hemorrhage grossly stable. No evidence of a vascular malformation. 3. Left PCA posterior circulation atherosclerosis with up to moderate stenosis of the left P2 and P3/P4 segments. Electronically Signed   By: Odessa Fleming M.D.   On: 11/04/2023 05:39   CT HEAD CODE STROKE WO CONTRAST`  Result Date: 11/04/2023 CLINICAL DATA:  Code stroke.  Right hand weakness EXAM: CT HEAD WITHOUT CONTRAST TECHNIQUE: Contiguous axial images were obtained from the base of the skull through the vertex without intravenous contrast. RADIATION DOSE REDUCTION: This exam was performed according to the departmental dose-optimization program which includes automated exposure control, adjustment of the mA and/or kV according to patient size and/or use of iterative reconstruction technique. COMPARISON:  None Available. FINDINGS: Brain: Small focus of acute subarachnoid hemorrhage over the left convexity. No other acute hemorrhage. Hypoattenuation of the white matter. Mild volume loss. Vascular: No hyperdense vessel or unexpected calcification. Skull: Normal. Negative for fracture or focal lesion. Sinuses/Orbits: No acute finding. Other: None. IMPRESSION: 1. Small  focus of acute subarachnoid hemorrhage over the left convexity. 2. Chronic small vessel ischemia and volume loss. Critical Value/emergent results were called by telephone at the time of interpretation on 11/04/2023 at 1:35 am to provider Geoffery Lyons , who verbally acknowledged these results. Electronically Signed   By: Deatra Robinson M.D.   On: 11/04/2023 01:35    EKG: Independently reviewed.  Sinus bradycardia at 54 bpm.  Nonspecific T wave flattening.  Assessment/Plan Principal Problem:   Subarachnoid hematoma (HCC) Active Problems:   OSA on CPAP   H/O ATRIAL FIBRILLATION, PAROXYSMAL   GERD (gastroesophageal reflux disease)   Diet-controlled diabetes mellitus (HCC)   BPH associated with nocturia   Hyperlipidemia associated with type 2 diabetes mellitus (HCC)   Hypertension   Hemorrhagic stroke Subarachnoid hemorrhage > Patient presenting with right-sided numbness and weakness as well as slurred speech noted to have small left subarachnoid hemorrhage. > Neurosurgery recommended repeat CT after 6 hours, the repeat was performed it was after 4.5 hours.  They also recommended Keppra for seizure prophylaxis and to keep systolic BP less than 150. > Initially was on Cardene infusion in the ED but this was weaned off on recommendation of critical care who was consulted for admission and blood pressure remained stable so patient was admitted to progressive unit. - Monitoring on progressive unit - Appreciate neurosurgery recommendations and assistance - As needed labetalol for systolic BP greater than 150 - Continue with Keppra 500 mg twice daily - F/u repeat CT Head - Consult Neurology for further workup per neurosurgery note - MR Brain  Hypertension - Not currently on any outpatient antihypertensives due to patient preference per med rec review  Hyperlipidemia - Not currently on any medications for this  Diabetes - Currently diet controlled  BPH GERD - Not currently on any  medications for these  Paroxysmal atrial fibrillation - History of this but not currently on any anticoagulation and currently in sinus rhythm.  DVT prophylaxis: SCDs for now Code Status:   Full Family Communication:  Updated at bedside  Disposition Plan:   Patient is from:  Home  Anticipated DC to:  Home  Anticipated DC date:  2 days  Anticipated DC barriers: None  Consults called:  Neurosurgery, Neurology Admission status:  Inpatient, progressive  Severity of Illness: The appropriate  patient status for this patient is INPATIENT. Inpatient status is judged to be reasonable and necessary in order to provide the required intensity of service to ensure the patient's safety. The patient's presenting symptoms, physical exam findings, and initial radiographic and laboratory data in the context of their chronic comorbidities is felt to place them at high risk for further clinical deterioration. Furthermore, it is not anticipated that the patient will be medically stable for discharge from the hospital within 2 midnights of admission.   * I certify that at the point of admission it is my clinical judgment that the patient will require inpatient hospital care spanning beyond 2 midnights from the point of admission due to high intensity of service, high risk for further deterioration and high frequency of surveillance required.Synetta Fail MD [facility]  How to contact the [facility] Attending or Consulting provider 7A - 7P or covering provider during after hours 7P -7A, for this patient?   Check the care team in [facility] and look for a) attending/consulting [facility] provider listed and b) the [facility] team listed Log into [email or web address] and use [place]'s universal password to access. If you do not have the password, please contact the hospital operator. Locate the [facility] provider you are looking for under [facility] and page to a number that you can be directly reached. If you still have  difficulty reaching the provider, please page the [facility] (Director on Call) for the Hospitalists listed on amion for assistance.  11/04/2023, 3:07 PM

## 2023-11-04 NOTE — ED Notes (Signed)
Called report to [place] at [facility] for room 4NP07C.

## 2023-11-04 NOTE — ED Notes (Signed)
Dr.Delo in room to exam pt. And called a code stroke. Spoke to the stroke RN, then took pt. To CT via stretcher. When we returned to ED, patient spoke with neuroloigist on the stroke cart. Pt. Has a slight facial droop on right side which wife states is normal, very slight drift in left leg, states numbness in right hand, though grips are equal bilaterally, as is sensation on both sides of body. Speech sounds slightly slurred; patient can read and recognize all pictures and words on stroke cards.

## 2023-11-04 NOTE — ED Notes (Signed)
Checked CBG 147, RN Alex informed

## 2023-11-04 NOTE — Consult Note (Signed)
   Providing Compassionate, Quality Care - Together  Neurosurgery Consult  Referring physician: EDP Reason for referral: SAH   History of Present Illness: Pt presented to [facility] ED last PM after brief episode of R hand numbness and speech difficulty. Not currently anticoagulated. Hypertensive when he presented. CTH revealing small L convexity SAH.    Physical Exam:  Vital signs in last 24 hours: Temp:  [98 F (36.7 C)-98.3 F (36.8 C)] 98 F (36.7 C) (07/25 1814) Pulse Rate:  [58-128] 65 (07/26 0746) Resp:  [11-18] 14 (07/26 0217) BP: (138-182)/(65-125) 153/88 (07/26 0700) SpO2:  [91 %-98 %] 96 % (07/26 0746)  PE: Awake, alert, oriented Speech fluent, appropriate Strength grossly intact BUE/BLE Asymmetric smile w/ R sided droop.  PERRLA EOMI 5/5 BUE/BLE No gross sensory deficits FC, MAEs x4    Impression/Assessment:  82 yo male with small acute nontraumatic L convexity SAH. CTA w/o clear etiology.   Plan:  -Recommend further CVA/seizure workup per Neurology. No acute neurosurgical intervention indicated at this time.  -Repeat CTH pending -Keppra 500mg  BID -SBP <150 -Neuro checks -Call w questions/concerns.   Yobany Vroom Margaree Mackintosh, PA-C

## 2023-11-04 NOTE — ED Notes (Signed)
Patient transported to CT at this time with RN.

## 2023-11-04 NOTE — Progress Notes (Signed)
Pt presented to ED after brief episode of R hand numbness and speech difficulty. Not currently anticoagulated. No neuro deficits currently per ED MD. [facility] revealing small L convexity SAH. Recommend transfer to [facility], CTA, repeat CTH 6H, Keppra 500mg  BID x7d, SBP <150. NSGY to follow.   Tangelia Sanson Margaree Mackintosh, PA-C

## 2023-11-05 ENCOUNTER — Inpatient Hospital Stay (HOSPITAL_COMMUNITY): Payer: Medicare HMO

## 2023-11-05 DIAGNOSIS — K219 Gastro-esophageal reflux disease without esophagitis: Secondary | ICD-10-CM | POA: Diagnosis not present

## 2023-11-05 DIAGNOSIS — N401 Enlarged prostate with lower urinary tract symptoms: Secondary | ICD-10-CM | POA: Diagnosis not present

## 2023-11-05 DIAGNOSIS — I63532 Cerebral infarction due to unspecified occlusion or stenosis of left posterior cerebral artery: Secondary | ICD-10-CM

## 2023-11-05 DIAGNOSIS — I609 Nontraumatic subarachnoid hemorrhage, unspecified: Secondary | ICD-10-CM

## 2023-11-05 DIAGNOSIS — E119 Type 2 diabetes mellitus without complications: Secondary | ICD-10-CM | POA: Diagnosis not present

## 2023-11-05 DIAGNOSIS — S066X0A Traumatic subarachnoid hemorrhage without loss of consciousness, initial encounter: Secondary | ICD-10-CM | POA: Diagnosis not present

## 2023-11-05 DIAGNOSIS — R569 Unspecified convulsions: Secondary | ICD-10-CM

## 2023-11-05 LAB — CBC
HCT: 42.3 % (ref 39.0–52.0)
Hemoglobin: 13.9 g/dL (ref 13.0–17.0)
MCH: 29.8 pg (ref 26.0–34.0)
MCHC: 32.9 g/dL (ref 30.0–36.0)
MCV: 90.6 fL (ref 80.0–100.0)
Platelets: 147 10*3/uL — ABNORMAL LOW (ref 150–400)
RBC: 4.67 MIL/uL (ref 4.22–5.81)
RDW: 13.2 % (ref 11.5–15.5)
WBC: 10.7 10*3/uL — ABNORMAL HIGH (ref 4.0–10.5)
nRBC: 0 % (ref 0.0–0.2)

## 2023-11-05 LAB — COMPREHENSIVE METABOLIC PANEL
ALT: 14 U/L (ref 0–44)
AST: 18 U/L (ref 15–41)
Albumin: 3.3 g/dL — ABNORMAL LOW (ref 3.5–5.0)
Alkaline Phosphatase: 86 U/L (ref 38–126)
Anion gap: 10 (ref 5–15)
BUN: 15 mg/dL (ref 8–23)
CO2: 25 mmol/L (ref 22–32)
Calcium: 8.8 mg/dL — ABNORMAL LOW (ref 8.9–10.3)
Chloride: 102 mmol/L (ref 98–111)
Creatinine, Ser: 0.94 mg/dL (ref 0.61–1.24)
GFR, Estimated: >60 mL/min (ref >60)
Glucose, Bld: 128 mg/dL — ABNORMAL HIGH (ref 70–99)
Potassium: 3.1 mmol/L — ABNORMAL LOW (ref 3.5–5.1)
Sodium: 137 mmol/L (ref 135–145)
Total Bilirubin: 2.3 mg/dL — ABNORMAL HIGH (ref <1.2)
Total Protein: 6.2 g/dL — ABNORMAL LOW (ref 6.5–8.1)

## 2023-11-05 LAB — LIPID PANEL
Cholesterol: 153 mg/dL (ref 0–200)
HDL: 34 mg/dL — ABNORMAL LOW (ref >40)
LDL Cholesterol: 98 mg/dL (ref 0–99)
Total CHOL/HDL Ratio: 4.5 {ratio}
Triglycerides: 106 mg/dL (ref <150)
VLDL: 21 mg/dL (ref 0–40)

## 2023-11-05 LAB — HEMOGLOBIN A1C
Hgb A1c MFr Bld: 7.1 % — ABNORMAL HIGH (ref 4.8–5.6)
Mean Plasma Glucose: 157.07 mg/dL

## 2023-11-05 MED ORDER — STROKE: EARLY STAGES OF RECOVERY BOOK
Freq: Once | Status: AC
  Administered 2023-11-05: 1
  Filled 2023-11-05: qty 1

## 2023-11-05 MED ORDER — LEVETIRACETAM 500 MG PO TABS
500.0000 mg | ORAL_TABLET | Freq: Two times a day (BID) | ORAL | Status: DC
  Administered 2023-11-05 – 2023-11-06 (×2): 500 mg via ORAL
  Filled 2023-11-05 (×2): qty 1

## 2023-11-05 MED ORDER — POTASSIUM CHLORIDE 20 MEQ PO PACK
40.0000 meq | PACK | Freq: Two times a day (BID) | ORAL | Status: AC
  Administered 2023-11-05 (×2): 40 meq via ORAL
  Filled 2023-11-05 (×2): qty 2

## 2023-11-05 MED ORDER — ASPIRIN 81 MG PO CHEW
81.0000 mg | CHEWABLE_TABLET | Freq: Every day | ORAL | Status: DC
  Administered 2023-11-05 – 2023-11-06 (×2): 81 mg via ORAL
  Filled 2023-11-05 (×2): qty 1

## 2023-11-05 MED ORDER — ROSUVASTATIN CALCIUM 20 MG PO TABS
20.0000 mg | ORAL_TABLET | Freq: Every day | ORAL | Status: DC
  Administered 2023-11-05 – 2023-11-06 (×2): 20 mg via ORAL
  Filled 2023-11-05 (×2): qty 1

## 2023-11-05 NOTE — Procedures (Addendum)
Patient Name: Ronald Kelley  MRN: [id number]  Epilepsy Attending: Charlsie Quest  Referring Physician/Provider: Erick Blinks, MD  Date: 11/05/2023 Duration: 23.59 mins  Patient history: 82 yo male with small acute nontraumatic L convexity SAH. EEG to evaluate for seizure  Level of alertness: Awake  AEDs during EEG study: LEV  Technical aspects: This EEG study was done with scalp electrodes positioned according to the 10-20 International system of electrode placement. Electrical activity was reviewed with band pass filter of 1-70Hz , sensitivity of 7 uV/mm, display speed of 53mm/sec with a 60Hz  notched filter applied as appropriate. EEG data were recorded continuously and digitally stored.  Video monitoring was available and reviewed as appropriate.  Description: The posterior dominant rhythm consists of 8 Hz activity of moderate voltage (25-35 uV) seen predominantly in posterior head regions, symmetric and reactive to eye opening and eye closing. EEG showed continuous 3 to 6 Hz theta-delta slowing in left temporal region. Hyperventilation and photic stimulation were not performed.     ABNORMALITY - Continuous slow, left temporal region  IMPRESSION: This study is suggestive of cortical dysfunction arising from left temporal region likely secondary to underlying structural abnormality/ SAH. No seizures or epileptiform discharges were seen throughout the recording.  Adriona Kaney Annabelle Harman

## 2023-11-05 NOTE — Progress Notes (Addendum)
STROKE TEAM PROGRESS NOTE   BRIEF HPI [name] is a 82 y.o. male with history of atrial fibrillation not on anticoagulation, diabetes, GERD, hypothyroidism, BPH, OSA on CPAP, vitamin D deficiency presenting with right-sided weakness and slurred speech.   NIH on Admission 1   SIGNIFICANT HOSPITAL EVENTS 12/4 admitted.  CT head with Small focus of acute subarachnoid hemorrhage over the left convexity.  INTERIM HISTORY/SUBJECTIVE  Ronald Kelley is laying in the bed in no apparent distress.  Family is at the bedside.  Patient states that yesterday he was traveling with his family they were going into [employer] and he was unable to use his right hand to open the door also had some slurred speech.  Originally he refused to come to the emergency room to be evaluated but family convinced him CT head showed hemorrhage.   MRI brain revealed small acute infarcts in left periatrial white matter and right occipital lobe, small area of  hemorrhage in left central sulcus-likely hemorrhagic infarct  EEG with left temporal slowing no seizures identified  Repeat head in the morning.  Will start aspirin 81 mg today OBJECTIVE  CBC    Component Value Date/Time   WBC 10.7 (H) 11/05/2023 0439   RBC 4.67 11/05/2023 0439   HGB 13.9 11/05/2023 0439   HGB 12.5 (L) 08/19/2022 1018   HCT 42.3 11/05/2023 0439   HCT 39.9 08/19/2022 1018   PLT 147 (L) 11/05/2023 0439   PLT 142 (L) 08/19/2022 1018   MCV 90.6 11/05/2023 0439   MCV 92 08/19/2022 1018   MCH 29.8 11/05/2023 0439   MCHC 32.9 11/05/2023 0439   RDW 13.2 11/05/2023 0439   RDW 12.3 08/19/2022 1018   LYMPHSABS 3.6 11/04/2023 0037   LYMPHSABS 3.2 (H) 08/30/2021 0852   MONOABS 1.1 (H) 11/04/2023 0037   EOSABS 0.3 11/04/2023 0037   EOSABS 0.2 08/30/2021 0852   BASOSABS 0.0 11/04/2023 0037   BASOSABS 0.1 08/30/2021 0852    BMET    Component Value Date/Time   NA 137 11/05/2023 0439   NA 142 09/28/2023 0852   K 3.1 (L) 11/05/2023 0439   CL 102  11/05/2023 0439   CO2 25 11/05/2023 0439   GLUCOSE 128 (H) 11/05/2023 0439   BUN 15 11/05/2023 0439   BUN 16 09/28/2023 0852   CREATININE 0.94 11/05/2023 0439   CREATININE 1.09 10/29/2016 0750   CALCIUM 8.8 (L) 11/05/2023 0439   EGFR 78 09/28/2023 0852   GFRNONAA >60 11/05/2023 0439   GFRNONAA 66 10/29/2016 0750    IMAGING past 24 hours EEG adult  Result Date: 11/05/2023 Charlsie Quest, MD     11/05/2023  8:21 AM Patient Name: Ronald Kelley MRN: [id number] Epilepsy Attending: Charlsie Quest Referring Physician/Provider: Erick Blinks, MD Date: 11/05/2023 Duration: 23.59 mins Patient history: 82 yo male with small acute nontraumatic L convexity SAH. EEG to evaluate for seizure Level of alertness: Awake AEDs during EEG study: LEV Technical aspects: This EEG study was done with scalp electrodes positioned according to the 10-20 International system of electrode placement. Electrical activity was reviewed with band pass filter of 1-70Hz , sensitivity of 7 uV/mm, display speed of 31mm/sec with a 60Hz  notched filter applied as appropriate. EEG data were recorded continuously and digitally stored.  Video monitoring was available and reviewed as appropriate. Description: The posterior dominant rhythm consists of 8 Hz activity of moderate voltage (25-35 uV) seen predominantly in posterior head regions, symmetric and reactive to eye opening and eye closing. EEG  showed continuous 3 to 6 Hz theta-delta slowing in left temporal region. Hyperventilation and photic stimulation were not performed.   ABNORMALITY - Continuous slow, left temporal region IMPRESSION: This study is suggestive of cortical dysfunction arising from left temporal region likely secondary to underlying structural abnormality/ SAH. No seizures or epileptiform discharges were seen throughout the recording. Charlsie Quest   MR BRAIN WO CONTRAST  Result Date: 11/05/2023 CLINICAL DATA:  Aphasia and numbness EXAM: MRI HEAD WITHOUT  CONTRAST TECHNIQUE: Multiplanar, multiecho pulse sequences of the brain and surrounding structures were obtained without intravenous contrast. COMPARISON:  Head CT 11/04/2023 FINDINGS: Brain: Small area of subarachnoid hemorrhage within the left central sulcus is again noted. There is mild edema of the adjacent brain. There are small foci of abnormal diffusion restriction within the left periatrial white matter and the right occipital lobe. There is multifocal hyperintense T2-weighted signal within the periventricular and deep white matter. Normal CSF spaces. Vascular: Normal flow voids Skull and upper cervical spine: Normal marrow signal. Sinuses/Orbits: Paranasal sinuses are clear. No mastoid effusion. Normal orbits. Ocular lens replacements. Other: None IMPRESSION: 1. Small acute infarcts within the left periatrial white matter and the right occipital lobe. 2. Small area of subarachnoid hemorrhage within the left central sulcus with mild edema of the adjacent brain. Allowing for different modality, unchanged. Electronically Signed   By: Deatra Robinson M.D.   On: 11/05/2023 01:51    Vitals:   11/05/23 0800 11/05/23 0956 11/05/23 1120 11/05/23 1447  BP: (!) 159/80 129/67 129/65 110/73  Pulse: (!) 53 (!) 59 60 60  Resp: 19  16 17   Temp:   97.7 F (36.5 C) 98 F (36.7 C)  TempSrc:   Oral Oral  SpO2: 97% 97% 95% 96%  Weight:      Height:         PHYSICAL EXAM General:  Alert, well-nourished, well-developed patient in no acute distress Psych:  Mood and affect appropriate for situation CV: Regular rate and rhythm on monitor Respiratory:  Regular, unlabored respirations on room air GI: Abdomen soft and nontender   NEURO:  Mental Status: AA&Ox3, patient is able to give clear and coherent history.  Mild dysarthria Speech/Language: speech is without aphasia.  Naming, repetition, fluency, and comprehension intact.  Cranial Nerves:  II: PERRL. Visual fields full.  III, IV, VI: EOMI. Eyelids  elevate symmetrically.  V: Sensation is intact to light touch and symmetrical to face.  VII: Right facial droop VIII: hearing intact to voice. IX, X: Palate elevates symmetrically. Phonation is normal.  GN:FAOZHYQM shrug 5/5. XII: tongue is midline without fasciculations. Motor: 5/5 strength to all muscle groups tested.  Weak grip on right Tone: is normal and bulk is normal Sensation- Intact to light touch bilaterally. Extinction absent to light touch to DSS.   Coordination: FTN intact bilaterally, HKS: no ataxia in BLE.No drift.  Decreased fine motor skills on right hand Gait- deferred  Most Recent NIH  1a Level of Conscious.: 0 1b LOC Questions: 0 1c LOC Commands: 0 2 Best Gaze: 0 3 Visual: 0 4 Facial Palsy: 1 5a Motor Arm - left: 0 5b Motor Arm - Right: 0 6a Motor Leg - Left: 0 6b Motor Leg - Right: 0 7 Limb Ataxia: 0 8 Sensory: 0 9 Best Language: 0 10 Dysarthria: 1 11 Extinct. and Inatten.:  TOTAL: 2   ASSESSMENT/PLAN  Acute Ischemic Infarct:  bilateral in left  periatrial white matter and right occipital lobe with hemorrhagic conversion.  Left frontal  convexity hemorrhage-likely a hemorrhagic infarct Etiology: Atrial fibrillation not on anticoagulation CT head small focus of acute subarachnoid hemorrhage over the left convexity. Chronic small vessel ischemia and volume loss CTA head & neck no LVO MRI  Small acute infarcts within the left periatrial white matter and the right occipital lobe. Small area of subarachnoid hemorrhage within the left central sulcus with mild edema 2D Echo EF 60 to 65%.  LV with grade 1 diastolic dysfunction.  Left atrium moderately dilated Eeg This study is suggestive of cortical dysfunction arising from left temporal region likely secondary to underlying structural abnormality/ SAH. No seizures or epileptiform discharges were seen throughout the recording.  LDL 98 HgbA1c 7.1 VTE prophylaxis - SCD's  No antithrombotic prior to admission,  now on aspirin 81 mg daily .  Consider transition to Eliquis in about 5 to 7 days Therapy recommendations:  Pending Disposition:  pending   Atrial fibrillation Home Meds: None Continue telemetry monitoring Begin anticoagulation with aspirin 81 mg.  Consider changing to DOAC in 5 to 7 days due to hemorrhage  Hypertension Home meds: None Stable Blood Pressure Goal: SBP less than 160   Hyperlipidemia Home meds: None LDL 98, goal < 70 Add Crestor 20 Continue statin at discharge  Diabetes type II UnControlled Home meds: None HgbA1c 7.1, goal < 7.0 CBGs SSI Recommend close follow-up with PCP for better DM control  Dysphagia Patient has post-stroke dysphagia, SLP consulted    Diet   Diet regular Fluid consistency: Thin   Advance diet as tolerated  Other Stroke Risk Factors  ETOH use, alcohol level <10, advised to drink no more than 2 drink(s) a day Obesity, Body mass index is 30.35 kg/m., BMI >/= 30 associated with increased stroke risk, recommend weight loss, diet and exercise as appropriate  OSA on CPAP  Other Active Problems Hypothyroidism GERD BPH  Hospital day # 1 Gevena Mart DNP, ACNPC-AG  [facility]  I have personally obtained history,examined this patient, reviewed notes, independently viewed imaging studies, participated in medical decision making and plan of care.ROS completed by me personally and pertinent positives fully documented  I have made any additions or clarifications directly to the above note. Agree with note above.  Patient presented with right hand weakness and numbness secondary to hemorrhagic left frontal MCA branch infarct along with 2 other infarcts noted on MRI likely from atrial fibrillation not on anticoagulation.  Recommend continue ongoing stroke workup.  Start aspirin for now and switch to anticoagulation with Eliquis after 1 week.  Strict blood pressure control with systolic goal below 160.  Mobilize out of bed.  Continue  ongoing stroke workup.  Physical occupational speech therapy consults.  Long discussion with patient and wife and daughter at the bedside about risk-benefit of anticoagulation in the setting of A-fib and recent hemorrhage and answering questions. Greater than 50% time during this 50-minute visit was spent on counseling and coordination of care about his hemorrhagic infarcts and atrial fibrillation and discussion about risk-benefit of anticoagulation and answering questions. Delia Heady, MD Medical Director [facility] Pager: [phone] 11/05/2023 5:28 PM   To contact Stroke Continuity provider, please refer to [email or web address]. After hours, contact General Neurology

## 2023-11-05 NOTE — Progress Notes (Signed)
    Providing Compassionate, Quality Care - Together   NEUROSURGERY PROGRESS NOTE     S: NAEs o/n.    O: EXAM:  BP (!) 155/96 (BP Location: Right Arm)   Pulse 68   Temp 97.8 F (36.6 C) (Oral)   Resp 18   Ht 5' 0.5" (1.537 m)   Wt 71.7 kg   SpO2 96%   BMI 30.35 kg/m   Awake, alert, oriented Speech fluent, appropriate Strength grossly intact BUE/BLE Asymmetric smile w/ R sided droop.  PERRLA EOMI 5/5 BUE/BLE No gross sensory deficits. Subjectively decreased R palmar surface FC, MAEs x4  ASSESSMENT:  82 y.o. with small acute nontraumatic L convexity SAH. MRI stable.     PLAN: -Cont workup per Neurology -NSGY to sign off -Call w/ questions/concerns.   Patrici Ranks, [facility]

## 2023-11-05 NOTE — Progress Notes (Signed)
EEG complete - results pending 

## 2023-11-05 NOTE — Progress Notes (Incomplete)
NEUROLOGY CONSULT NOTE   Date of service: November 04, 2023 Patient Name: Ronald Kelley MRN:  [id number] DOB:  11-13-1941 Chief Complaint: "strokes" Requesting Provider: Synetta Fail, MD  History of Present Illness  Ronald Kelley is a 82 y.o. male with afibb not on AC, DM2, GERD, BPH, OSA, hyperparathyroidism p/w R sided weakness and slurred speech. Was with his family and driving from [place] when they stopped at [place] and he noticed that he was unable to grab the door with his R hand. They drove home. Family encouraged him to come to the ED. He initially declined and went to bed but did agree and was brought in to ED.  Initially presented to [facility] ED where he was evaluated emergently by teleneurology as a code stroke. He was noted to have R hemianopsia, R facial palsy, sensory changes and slrred speech. CT Head demonstrated SAH and repeat CT Head stable. CTA with no aneurysm, CTA with no LVO, no aneurysms. He had MRI Brain which demonstrated L MCA SAH, watershed strokes.   LKW: 1730 on Tuesday. Modified rankin score: 2-Slight disability-UNABLE to perform all activities but does not need assistance IV Thrombolysis: not offered 2/2 SAH EVT: not offered, no LVO.  NIHSS components Score: Comment  1a Level of Conscious 0[x]  1[]  2[]  3[]      1b LOC Questions 0[x]  1[]  2[]       1c LOC Commands 0[x]  1[]  2[]       2 Best Gaze 0[x]  1[]  2[]       3 Visual 0[x]  1[]  2[]  3[]      4 Facial Palsy 0[x]  1[]  2[]  3[]      5a Motor Arm - left 0[x]  1[]  2[]  3[]  4[]  UN[]    5b Motor Arm - Right 0[x]  1[]  2[]  3[]  4[]  UN[]    6a Motor Leg - Left 0[x]  1[]  2[]  3[]  4[]  UN[]    6b Motor Leg - Right 0[x]  1[]  2[]  3[]  4[]  UN[]    7 Limb Ataxia 0[x]  1[]  2[]  3[]  UN[]     8 Sensory 0[x]  1[]  2[]  UN[]      9 Best Language 0[x]  1[]  2[]  3[]      10 Dysarthria 0[x]  1[]  2[]  UN[]      11 Extinct. and Inattention 0[x]  1[]  2[]       TOTAL: 0     ROS  Comprehensive ROS performed and pertinent positives documented in HPI   Past  History   Past Medical History:  Diagnosis Date  . Atrial fib/flutter, transient (HCC)   . Degenerative disk disease   . Diabetes mellitus    a1c 6.5, 07/2008  . GERD (gastroesophageal reflux disease)   . History of inguinal hernia repair, bilateral 03/23/2019  . History of kidney stones   . History of vitamin D deficiency 09/27/2016  . Hyperthyroidism    remote h/o , no ablation  . OSA on CPAP     Past Surgical History:  Procedure Laterality Date  . HERNIA REPAIR  2010  . 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 PARATHYROIDECTOMY Right 01/22/2023   Procedure: RIGHT INFERIOR PARATHYROIDECTOMY;  Surgeon: Darnell Level, MD;  Location: [facility] ORS;  Service: General;  Laterality: Right;  . ROTATOR CUFF REPAIR      Family History: Family History  Problem Relation Age of Onset  . Hypertension Mother   . Diabetes Brother   . Alzheimer's disease Father 52  . Coronary artery disease Neg Hx   . Stroke Neg  Hx   . Colon cancer Neg Hx   . Prostate cancer Neg Hx     Social History  reports that he has never smoked. He has never been exposed to tobacco smoke. He has never used smokeless tobacco. He reports that he does not drink alcohol and does not use drugs.  Allergies  Allergen Reactions  . Hydrocodone-Acetaminophen Other (See Comments)    REACTION: atrial fibrilation  . Oxycodone Other (See Comments)    Atrial fibrilation  . Oxycodone-Acetaminophen Other (See Comments)    REACTION: atrial fibrilation   ok Darvocet    Medications   Current Facility-Administered Medications:  .  acetaminophen (TYLENOL) tablet 650 mg, 650 mg, Oral, Q6H PRN **OR** acetaminophen (TYLENOL) suppository 650 mg, 650 mg, Rectal, Q6H PRN, Synetta Fail, MD .  labetalol (NORMODYNE) injection 5 mg, 5 mg, Intravenous, Q2H PRN, Synetta Fail, MD, 5 mg at 11/04/23 1538 .  levETIRAcetam (KEPPRA) IVPB 500 mg/100 mL premix, 500 mg, Intravenous,  BID, Synetta Fail, MD .  Melene Muller ON 11/05/2023] levothyroxine (SYNTHROID) tablet 50 mcg, 50 mcg, Oral, Q0600, Synetta Fail, MD .  Oral care mouth rinse, 15 mL, Mouth Rinse, PRN, Synetta Fail, MD .  polyethylene glycol (MIRALAX / GLYCOLAX) packet 17 g, 17 g, Oral, Daily PRN, Synetta Fail, MD .  sodium chloride flush (NS) 0.9 % injection 3 mL, 3 mL, Intravenous, Q12H, Synetta Fail, MD .  Melene Muller ON 11/05/2023] timolol (TIMOPTIC) 0.5 % ophthalmic solution 1 drop, 1 drop, Both Eyes, Daily, Synetta Fail, MD  Vitals   Vitals:   11/04/23 1305 11/04/23 1422 11/04/23 1535 11/04/23 1600  BP:  (!) 179/82 (!) 153/77 (!) 146/74  Pulse:  75 60 (!) 57  Resp:  19 (!) 23 12  Temp: 98.4 F (36.9 C) 97.7 F (36.5 C)    TempSrc: Oral Oral    SpO2:  96% 94% 95%  Weight:      Height:        Body mass index is 30.35 kg/m.  Physical Exam   General: Laying comfortably in bed; in no acute distress.  HENT: Normal oropharynx and mucosa. Normal external appearance of ears and nose.  Neck: Supple, no pain or tenderness  CV: No JVD. No peripheral edema.  Pulmonary: Symmetric Chest rise. Normal respiratory effort.  Abdomen: Soft to touch, non-tender.  Ext: No cyanosis, edema, or deformity  Skin: No rash. Normal palpation of skin.   Musculoskeletal: Normal digits and nails by inspection. No clubbing.   Neurologic Examination  Mental status/Cognition: Alert, oriented to self, place, month and year, good attention.  Speech/language: Fluent, comprehension intact, object naming intact, repetition intact.  Cranial nerves:   CN II Pupils equal and reactive to light, no VF deficits    CN III,IV,VI EOM intact, no gaze preference or deviation, no nystagmus    CN V normal sensation in V1, V2, and V3 segments bilaterally    CN VII Slight R   CN VIII normal hearing to speech    CN IX & X normal palatal elevation, no uvular deviation    CN XI 5/5 head turn and 5/5 shoulder  shrug bilaterally    CN XII midline tongue protrusion    Motor:  Muscle bulk: ***, tone ***, pronator drift *** tremor *** Mvmt Root Nerve  Muscle Right Left Comments  SA C5/6 Ax Deltoid     EF C5/6 Mc Biceps     EE C6/7/8 Rad Triceps  WF C6/7 Med FCR     WE C7/8 PIN ECU     F Ab C8/T1 U ADM/FDI     HF L1/2/3 Fem Illopsoas     KE L2/3/4 Fem Quad     DF L4/5 D Peron Tib Ant     PF S1/2 Tibial Grc/Sol      Reflexes:  Right Left Comments  Pectoralis      Biceps (C5/6)     Brachioradialis (C5/6)      Triceps (C6/7)      Patellar (L3/4)      Achilles (S1)      Hoffman      Plantar     Jaw jerk    Sensation:  Light touch    Pin prick    Temperature    Vibration   Proprioception    Coordination/Complex Motor:  - Finger to Nose *** - Heel to shin *** - Rapid alternating movement *** - Gait: Stride length ***. Arm swing ***. Base width ***    Labs/Imaging/Neurodiagnostic studies   CBC:  Recent Labs  Lab 2023-11-08 0037 11-08-23 1523  WBC 11.1* 8.8  NEUTROABS 6.0  --   HGB 13.9 15.4  HCT 41.8 46.5  MCV 92.3 92.6  PLT 161 112*   Basic Metabolic Panel:  Lab Results  Component Value Date   NA 140 11-08-23   K 3.6 08-Nov-2023   CO2 29 11-08-23   GLUCOSE 131 (H) 11/08/2023   BUN 25 (H) 11/08/2023   CREATININE 1.00 11/08/2023   CALCIUM 9.7 11/08/23   GFRNONAA >60 11/08/23   GFRAA 77 12/31/2020   Lipid Panel:  Lab Results  Component Value Date   LDLCALC 87 12/31/2020   HgbA1c:  Lab Results  Component Value Date   HGBA1C 7.6 (H) 09/28/2023   Urine Drug Screen: No results found for: "LABOPIA", "COCAINSCRNUR", "LABBENZ", "AMPHETMU", "THCU", "LABBARB"  Alcohol Level     Component Value Date/Time   ETH <10 November 08, 2023 0037   INR  Lab Results  Component Value Date   INR 1.0 2023-11-08   APTT  Lab Results  Component Value Date   APTT 28 11/08/2023   AED levels: No results found for: "PHENYTOIN", "ZONISAMIDE", "LAMOTRIGINE",  "LEVETIRACETA"  CT Head without contrast(Personally reviewed): ***  CT angio Head and Neck with contrast(Personally reviewed): ***  MR Angio head without contrast and Carotid Duplex BL(Personally reviewed): ***  MRI Brain(Personally reviewed): ***  Neurodiagnostics rEEG:  ***  ASSESSMENT   Ronald Kelley is a 82 y.o. male  has a past medical history of Atrial fib/flutter, transient (HCC), Degenerative disk disease, Diabetes mellitus, GERD (gastroesophageal reflux disease), History of inguinal hernia repair, bilateral (03/23/2019), History of kidney stones, History of vitamin D deficiency (09/27/2016), Hyperthyroidism, and OSA on CPAP. ***  RECOMMENDATIONS  *** ______________________________________________________________________    Welton Flakes, MD [facility]

## 2023-11-05 NOTE — Evaluation (Signed)
Clinical/Bedside Swallow Evaluation Patient Details  Name: Ronald Kelley MRN: [id number] Date of Birth: 1941/02/28  Today's Date: 11/05/2023 Time: SLP Start Time (ACUTE ONLY): 1145 SLP Stop Time (ACUTE ONLY): 1215 SLP Time Calculation (min) (ACUTE ONLY): 30 min  Past Medical History:  Past Medical History:  Diagnosis Date   Atrial fib/flutter, transient (HCC)    Degenerative disk disease    Diabetes mellitus    a1c 6.5, 07/2008   GERD (gastroesophageal reflux disease)    History of inguinal hernia repair, bilateral 03/23/2019   History of kidney stones    History of vitamin D deficiency 09/27/2016   Hyperthyroidism    remote h/o , no ablation   OSA on CPAP    Past Surgical History:  Past Surgical History:  Procedure Laterality Date   HERNIA REPAIR  2010   L5 acute HNP     s/p surgery 09/21/08-- still has occasional paretheisa of the left foot    PARATHYROIDECTOMY Right 01/22/2023   Procedure: RIGHT INFERIOR PARATHYROIDECTOMY;  Surgeon: Darnell Level, MD;  Location: [facility];  Service: General;  Laterality: Right;   ROTATOR CUFF REPAIR     HPI:  Patient is an 82 y.o. male who presented 12/04 with R sided weakness and slurred speech. He was evaluated emergently by teleneurology as a code stroke. He was noted to have R hemianopsia, R facial palsy, sensory changes and slurred speech. MRI brain revealed L MCA SAH, watershed strokes. PMH significant for afibb not on AC, DM2, GERD, BPH, OSA, hyperparathyroidism.    Assessment / Plan / Recommendation  Clinical Impression  Patient seen by SLP for beside swallow evaluation post R MCA. Patient was asleep when SLP entered the room, however was easily roused by sound of voice. His family was present at bedside and provided hx. His family reported he had a good appetite and was not presenting with overt s/sx of aspiration when eating breakfast. He has been biting the right side of his inner lip when chewing, a problem that was present before this  hospitalization but seems exacerbated due to the stroke. Patient reported he avoided his eggs at breakfast as the chewing they required resulted in pain to his lip. Oral cavity examination revealed several small ulcers on the R side of his inner bottom lip as a result of recurrent injury. Oral cavity otherwise unremarkable. OME completed and WFL, very mild R sided weakness, slurred speech appeared to have resolved. SLP directly observed patient with thin liquid and pureed consistencies. Swallow appeared [facility] with no overt s/sx aspiration. Patient and SLP discussed options of altering his diet so he would require less chewing or keeping diet the same and allowing patient to select foods per his tolerance. Given his awareness and ability to compensate, decision was made to continue regular diet with thin liquids at this time. No further ST needs, SLP to s/o. SLP Visit Diagnosis: Dysphagia, unspecified (R13.10)    Aspiration Risk  No limitations    Diet Recommendation Thin liquid;Regular    Liquid Administration via: Cup;Straw Medication Administration: Whole meds with liquid Supervision: Patient able to self feed Compensations: Minimize environmental distractions;Slow rate;Small sips/bites Postural Changes: Seated upright at 90 degrees    Other  Recommendations Oral Care Recommendations: Oral care BID    Recommendations for follow up therapy are one component of a multi-disciplinary discharge planning process, led by the attending physician.  Recommendations may be updated based on patient status, additional functional criteria and insurance authorization.  Follow up Recommendations No SLP  follow up      Assistance Recommended at Discharge    Functional Status Assessment Patient has not had a recent decline in their functional status  Frequency and Duration            Prognosis        Swallow Study   General Date of Onset: 11/04/23 HPI: Patient is an 82 y.o. male who presented 12/04  with R sided weakness and slurred speech. He was evaluated emergently by teleneurology as a code stroke. He was noted to have R hemianopsia, R facial palsy, sensory changes and slrred speech. MRI brain revealed L MCA SAH, watershed strokes. PMH significant for afibb not on AC, DM2, GERD, BPH, OSA, hyperparathyroidism. Type of Study: Bedside Swallow Evaluation Previous Swallow Assessment: n/a Diet Prior to this Study: Regular;Thin liquids (Level 0) Temperature Spikes Noted: No Respiratory Status: Room air History of Recent Intubation: No Behavior/Cognition: Alert;Cooperative;Pleasant mood Oral Cavity Assessment: Within Functional Limits Oral Care Completed by SLP: No Oral Cavity - Dentition: Adequate natural dentition Vision: Functional for self-feeding Self-Feeding Abilities: Able to feed self;Needs set up Patient Positioning: Upright in bed Baseline Vocal Quality: Normal Volitional Swallow: Able to elicit    Oral/Motor/Sensory Function Overall Oral Motor/Sensory Function: Within functional limits   Ice Chips Ice chips: Not tested   Thin Liquid Thin Liquid: Within functional limits Presentation: Straw;Self Fed    Nectar Thick     Honey Thick     Puree Puree: Within functional limits Presentation: Spoon   Solid           Marline Backbone, B.S., Speech Therapy Student \  11/05/2023,1:13 PM

## 2023-11-05 NOTE — Progress Notes (Signed)
[facility]  PROGRESS NOTE  Ronald Kelley ZOX:[id number] DOB: 06-16-1941 DOA: 11/04/2023 PCP: [name], MD   Brief HPI:    82 y.o. male with medical history significant of hypertension, hyperlipidemia, diabetes, BPH, GERD, paroxysmal defibrillation, OSA, hyperparathyroidism presenting with right-sided weakness and slurred speech.  CT head at 00:45 showed small acute subarachnoid hemorrhage in the left.  CT angiogram of the head at 05 100 showed grossly stable left middle gyrus subarachnoid hemorrhage, and noted some moderate stenosis of the left P2, 3, 4 arteries.  Neurosurgery recommended admission, Keppra for seizure prophylaxis, systolic BP less than 150    Assessment/Plan:   Hemorrhagic stroke Subarachnoid hemorrhage -Presented with right-sided numbness and weakness with slurred speech -Found to have small subarachnoid hemorrhage -Neurosurgery consulted, repeat CT head was performed, no surgical intervention recommended -Neurology consulted, stroke workup initiated > Patient presenting with right-sided numbness and weakness as well as slurred speech noted to have small left subarachnoid hemorrhage. > Neurosurgery recommended repeat CT after 6 hours, the repeat was performed it was after 4.5 hours.  They also recommended Keppra for seizure prophylaxis and to keep systolic BP less than 150. - As needed labetalol for systolic BP greater than 150 - Continue with Keppra 500 mg twice daily -Follow echocardiogram -Started on aspirin    Hypertension - Not currently on any outpatient antihypertensives due to patient preference per med rec review   Hyperlipidemia - Not currently on any medications for this   Diabetes - Currently diet controlled   BPH GERD - Not currently on any medications for these   Paroxysmal atrial fibrillation - History of this but not currently on any anticoagulation and currently in sinus rhythm. -Neurology recommends to switch to Eliquis in 5  to 7 days    Medications     levothyroxine  50 mcg Oral Q0600   sodium chloride flush  3 mL Intravenous Q12H   timolol  1 drop Both Eyes Daily     Data Reviewed:   CBG:  Recent Labs  Lab 11/04/23 0049  GLUCAP 147*    SpO2: 97 %    Vitals:   11/04/23 1600 11/04/23 1951 11/04/23 2315 11/05/23 0251  BP: (!) 146/74 (!) 114/56 (!) 142/66 (!) 152/74  Pulse: (!) 57 64 (!) 55 60  Resp: 12 13 12 17   Temp:  97.8 F (36.6 C) 98 F (36.7 C) 98.5 F (36.9 C)  TempSrc:  Oral Oral Oral  SpO2: 95% 96% 92% 97%  Weight:      Height:          Data Reviewed:  Basic Metabolic Panel: Recent Labs  Lab 11/04/23 0037 11/05/23 0439  NA 140 137  K 3.6 3.1*  CL 102 102  CO2 29 25  GLUCOSE 131* 128*  BUN 25* 15  CREATININE 1.00 0.94  CALCIUM 9.7 8.8*    CBC: Recent Labs  Lab 11/04/23 0037 11/04/23 1523 11/05/23 0439  WBC 11.1* 8.8 10.7*  NEUTROABS 6.0  --   --   HGB 13.9 15.4 13.9  HCT 41.8 46.5 42.3  MCV 92.3 92.6 90.6  PLT 161 112* 147*    LFT Recent Labs  Lab 11/04/23 0037 11/05/23 0439  AST 14* 18  ALT 14 14  ALKPHOS 82 86  BILITOT 1.3* 2.3*  PROT 7.4 6.2*  ALBUMIN 4.2 3.3*     Antibiotics: Anti-infectives (From admission, onward)    None        DVT prophylaxis: SCDs  Code Status: Full  code  Family Communication: Discussed with patient's wife at bedside   CONSULTS neurology   Subjective   Denies any complaints   Objective    Physical Examination:   General-appears in no acute distress Heart-S1-S2, regular, no murmur auscultated Lungs-clear to auscultation bilaterally, no wheezing or crackles auscultated Abdomen-soft, nontender, no organomegaly Extremities-no edema in the lower extremities Neuro-alert, oriented x3, mild right-sided weakness   Status is: Inpatient:             Meredeth Ide   [facility] If 7PM-7AM, please contact night-coverage at [email or web address], Office  [phone]   11/05/2023,  7:44 AM  LOS: 1 day

## 2023-11-06 ENCOUNTER — Other Ambulatory Visit (HOSPITAL_COMMUNITY): Payer: Self-pay

## 2023-11-06 ENCOUNTER — Inpatient Hospital Stay (HOSPITAL_COMMUNITY): Payer: Medicare HMO

## 2023-11-06 DIAGNOSIS — N401 Enlarged prostate with lower urinary tract symptoms: Secondary | ICD-10-CM | POA: Diagnosis not present

## 2023-11-06 DIAGNOSIS — R351 Nocturia: Secondary | ICD-10-CM | POA: Diagnosis not present

## 2023-11-06 DIAGNOSIS — S066X0A Traumatic subarachnoid hemorrhage without loss of consciousness, initial encounter: Secondary | ICD-10-CM | POA: Diagnosis not present

## 2023-11-06 DIAGNOSIS — I10 Essential (primary) hypertension: Secondary | ICD-10-CM | POA: Diagnosis not present

## 2023-11-06 DIAGNOSIS — I63532 Cerebral infarction due to unspecified occlusion or stenosis of left posterior cerebral artery: Secondary | ICD-10-CM | POA: Diagnosis not present

## 2023-11-06 LAB — POTASSIUM: Potassium: 4.3 mmol/L (ref 3.5–5.1)

## 2023-11-06 MED ORDER — ROSUVASTATIN CALCIUM 20 MG PO TABS: 20.0000 mg | ORAL_TABLET | Freq: Every day | ORAL | 3 refills | Status: DC

## 2023-11-06 MED ORDER — VALSARTAN 40 MG PO TABS: 40.0000 mg | ORAL_TABLET | Freq: Every day | ORAL | 3 refills | Status: DC

## 2023-11-06 MED ORDER — LEVETIRACETAM 500 MG PO TABS: 500.0000 mg | ORAL_TABLET | Freq: Two times a day (BID) | ORAL | 2 refills | Status: DC

## 2023-11-06 MED ORDER — APIXABAN 5 MG PO TABS: 5.0000 mg | ORAL_TABLET | Freq: Two times a day (BID) | ORAL | 3 refills | Status: DC

## 2023-11-06 MED ORDER — ASPIRIN 81 MG PO CHEW: 81.0000 mg | CHEWABLE_TABLET | Freq: Every day | ORAL | 0 refills | Status: AC

## 2023-11-06 NOTE — Evaluation (Signed)
Physical Therapy Evaluation Patient Details Name: Ronald Kelley MRN: [id number] DOB: Nov 29, 1941 Today's Date: 11/06/2023  History of Present Illness  Pt is 82 yo presenting with R sided weakness and slurred speech to [facility] ED. PMH: A-fib, DM, GERD, hypothyroidism, BPH, OSA vit D deficiency.  Clinical Impression  Pt is presenting at baseline level of functioning. Pt is ind to Mod I with all functional activities including navigating stairs per home set up. Pt has supportive family and did not require an AD. Due to pt current functional level, PLOF, home set up and available assistance at home no recommended skilled physical therapy services at this time. Pt will be discharged from skilled physical therapy services; please re-consult if further needs arise.               Equipment Recommendations None recommended by PT     Functional Status Assessment Patient has not had a recent decline in their functional status     Precautions / Restrictions Precautions Precautions: None Restrictions Weight Bearing Restrictions: No      Mobility  Bed Mobility Overal bed mobility: Independent      Transfers Overall transfer level: Independent Equipment used: None      Ambulation/Gait Ambulation/Gait assistance: Independent Gait Distance (Feet): 400 Feet Assistive device: None Gait Pattern/deviations: Step-through pattern, WFL(Within Functional Limits) Gait velocity: slightly decreased Gait velocity interpretation: >2.62 ft/sec, indicative of community ambulatory      Stairs Stairs: Yes Stairs assistance: Modified independent (Device/Increase time) Stair Management: One rail Left, Forwards, No rails, Alternating pattern, Step to pattern Number of Stairs: 3 General stair comments: alternating ascending, step to descending.    Modified Rankin (Stroke Patients Only) Modified Rankin (Stroke Patients Only) Pre-Morbid Rankin Score: No symptoms Modified Rankin: No significant  disability     Balance Overall balance assessment: Mild deficits observed, not formally tested           Pertinent Vitals/Pain Pain Assessment Pain Assessment: No/denies pain    Home Living Family/patient expects to be discharged to:: Private residence Living Arrangements: Spouse/significant other Available Help at Discharge: Family;Available PRN/intermittently Type of Home: House Home Access: Stairs to enter Entrance Stairs-Rails: Right;Left Entrance Stairs-Number of Steps: 2, split level and has 1 step to get from living to dining room Alternate Level Stairs-Number of Steps: flight Home Layout: Two level;Able to live on main level with bedroom/bathroom Home Equipment: Gilmer Mor - single point      Prior Function Prior Level of Function : Independent/Modified Independent;Working/employed;Driving             Mobility Comments: Denies falls, works part time and rides with his son while he is working in the car. ADLs Comments: Ind     Extremity/Trunk Assessment   Upper Extremity Assessment Upper Extremity Assessment: Overall WFL for tasks assessed    Lower Extremity Assessment Lower Extremity Assessment: Overall WFL for tasks assessed    Cervical / Trunk Assessment Cervical / Trunk Assessment: Normal  Communication   Communication Communication: No apparent difficulties  Cognition Arousal: Alert Behavior During Therapy: WFL for tasks assessed/performed Overall Cognitive Status: Within Functional Limits for tasks assessed       General Comments General comments (skin integrity, edema, etc.): small bruise under the R eye. Son present throughout session and supportive. Spouse present at end of session.        Assessment/Plan    PT Assessment Patient does not need any further PT services         PT Goals (Current goals can  be found in the Care Plan section)  Acute Rehab PT Goals PT Goal Formulation: All assessment and education complete, DC therapy      AM-PAC PT "6 Clicks" Mobility  Outcome Measure Help needed turning from your back to your side while in a flat bed without using bedrails?: None Help needed moving from lying on your back to sitting on the side of a flat bed without using bedrails?: None Help needed moving to and from a bed to a chair (including a wheelchair)?: None Help needed standing up from a chair using your arms (e.g., wheelchair or bedside chair)?: None Help needed to walk in hospital room?: None Help needed climbing 3-5 steps with a railing? : None 6 Click Score: 24    End of Session Equipment Utilized During Treatment: Gait belt Activity Tolerance: Patient tolerated treatment well Patient left: with family/visitor present;with call bell/phone within reach;Other (comment) (pt standing in room with family) Nurse Communication: Mobility status      Time: 1344-1400 PT Time Calculation (min) (ACUTE ONLY): 16 min   Charges:   PT Evaluation $PT Eval Low Complexity: 1 Low   PT General Charges $$ ACUTE PT VISIT: 1 Visit         Harrel Carina, DPT, CLT  Acute Rehabilitation Services Office: [phone] (Secure chat preferred)   Claudia Desanctis 11/06/2023, 2:02 PM

## 2023-11-06 NOTE — Progress Notes (Signed)
STROKE TEAM PROGRESS NOTE   BRIEF HPI [name] is a 82 y.o. male with history of atrial fibrillation not on anticoagulation, diabetes, GERD, hypothyroidism, BPH, OSA on CPAP, vitamin D deficiency presenting with right-sided weakness and slurred speech.   NIH on Admission 1   SIGNIFICANT HOSPITAL EVENTS 12/4 admitted.  CT head with Small focus of acute subarachnoid hemorrhage over the left convexity.  INTERIM HISTORY/SUBJECTIVE Patient states he is doing well.  Right hand strength is improving.  He denies any headache.  Neurological exam stable.  Vital signs stable.  Wants to go home.  He is awaiting therapy eval   OBJECTIVE  CBC    Component Value Date/Time   WBC 10.7 (H) 11/05/2023 0439   RBC 4.67 11/05/2023 0439   HGB 13.9 11/05/2023 0439   HGB 12.5 (L) 08/19/2022 1018   HCT 42.3 11/05/2023 0439   HCT 39.9 08/19/2022 1018   PLT 147 (L) 11/05/2023 0439   PLT 142 (L) 08/19/2022 1018   MCV 90.6 11/05/2023 0439   MCV 92 08/19/2022 1018   MCH 29.8 11/05/2023 0439   MCHC 32.9 11/05/2023 0439   RDW 13.2 11/05/2023 0439   RDW 12.3 08/19/2022 1018   LYMPHSABS 3.6 11/04/2023 0037   LYMPHSABS 3.2 (H) 08/30/2021 0852   MONOABS 1.1 (H) 11/04/2023 0037   EOSABS 0.3 11/04/2023 0037   EOSABS 0.2 08/30/2021 0852   BASOSABS 0.0 11/04/2023 0037   BASOSABS 0.1 08/30/2021 0852    BMET    Component Value Date/Time   NA 137 11/05/2023 0439   NA 142 09/28/2023 0852   K 3.1 (L) 11/05/2023 0439   CL 102 11/05/2023 0439   CO2 25 11/05/2023 0439   GLUCOSE 128 (H) 11/05/2023 0439   BUN 15 11/05/2023 0439   BUN 16 09/28/2023 0852   CREATININE 0.94 11/05/2023 0439   CREATININE 1.09 10/29/2016 0750   CALCIUM 8.8 (L) 11/05/2023 0439   EGFR 78 09/28/2023 0852   GFRNONAA >60 11/05/2023 0439   GFRNONAA 66 10/29/2016 0750    IMAGING past 24 hours CT HEAD WO CONTRAST ( )  Result Date: 11/06/2023 CLINICAL DATA:  Stroke, hemorrhagic EXAM: CT HEAD WITHOUT CONTRAST TECHNIQUE:  Contiguous axial images were obtained from the base of the skull through the vertex without intravenous contrast. RADIATION DOSE REDUCTION: This exam was performed according to the departmental dose-optimization program which includes automated exposure control, adjustment of the mA and/or kV according to patient size and/or use of iterative reconstruction technique. COMPARISON:  Head CT 11/04/23, Brain MRI 11/04/23 FINDINGS: Brain: Increased subarachnoid hemorrhage in the left frontal lobe. No evidence of intraventricular extension. No new sites of hemorrhage. No mass effect. No mass lesion. No CT evidence of an acute cortical infarct. Additional infarcts seen on recent brain MRI are not visualized on this exam due to CT technique. Vascular: No hyperdense vessel or unexpected calcification. Skull: Normal. Negative for fracture or focal lesion. Sinuses/Orbits: No middle ear or mastoid effusion. Paranasal sinuses are clear. Bilateral lens replacement. Orbits are otherwise unremarkable. Other: None. IMPRESSION: Increased subarachnoid hemorrhage in the left frontal lobe. No evidence of intraventricular extension. No new sites of hemorrhage. Electronically Signed   By: Lorenza Cambridge M.D.   On: 11/06/2023 11:56    Vitals:   11/05/23 2021 11/05/23 2344 11/06/23 0354 11/06/23 1220  BP: 129/66 131/73 131/69 (!) 159/80  Pulse: 61 (!) 53 (!) 51 (!) 58  Resp: 16 18 17    Temp: 97.9 F (36.6 C) 98.1 F (36.7 C)  97.9 F (36.6 C) 97.7 F (36.5 C)  TempSrc: Oral Oral Oral Oral  SpO2: 94% 98% 94% 99%  Weight:      Height:         PHYSICAL EXAM General:  Alert, well-nourished, well-developed patient in no acute distress Psych:  Mood and affect appropriate for situation CV: Regular rate and rhythm on monitor Respiratory:  Regular, unlabored respirations on room air GI: Abdomen soft and nontender   NEURO:  Mental Status: AA&Ox3, patient is able to give clear and coherent history.  No dysarthria  speech/Language: speech is without aphasia.  Naming, repetition, fluency, and comprehension intact.  Cranial Nerves:  II: PERRL. Visual fields full.  III, IV, VI: EOMI. Eyelids elevate symmetrically.  V: Sensation is intact to light touch and symmetrical to face.  VII: Right facial droop improving VIII: hearing intact to voice. IX, X: Palate elevates symmetrically. Phonation is normal.  VV:OHYWVPXT shrug 5/5. XII: tongue is midline without fasciculations. Motor: 5/5 strength to all muscle groups tested.  Weak grip on right Tone: is normal and bulk is normal Sensation- Intact to light touch bilaterally. Extinction absent to light touch to DSS.   Coordination: FTN intact bilaterally, HKS: no ataxia in BLE.No drift.  Decreased fine motor skills on right hand Gait- deferred  Most Recent NIH  1a Level of Conscious.: 0 1b LOC Questions: 0 1c LOC Commands: 0 2 Best Gaze: 0 3 Visual: 0 4 Facial Palsy: 1 5a Motor Arm - left: 0 5b Motor Arm - Right: 0 6a Motor Leg - Left: 0 6b Motor Leg - Right: 0 7 Limb Ataxia: 0 8 Sensory: 0 9 Best Language: 0 10 Dysarthria: 0 11 Extinct. and Inatten.:  TOTAL: 1   ASSESSMENT/PLAN  Acute Ischemic Infarct:  bilateral -in left  periatrial white matter and right occipital lobe with hemorrhagic conversion in Left frontal convexity embolic infarct-hemorrhage-likely a hemorrhagic infarct Etiology: Atrial fibrillation not on anticoagulation CT head small focus of acute subarachnoid hemorrhage over the left convexity. Chronic small vessel ischemia and volume loss CTA head & neck no LVO MRI  Small acute infarcts within the left periatrial white matter and the right occipital lobe. Small area of subarachnoid hemorrhage within the left central sulcus with mild edema 2D Echo EF 60 to 65%.  LV with grade 1 diastolic dysfunction.  Left atrium moderately dilated Eeg This study is suggestive of cortical dysfunction arising from left temporal region likely  secondary to underlying structural abnormality/ SAH. No seizures or epileptiform discharges were seen throughout the recording.  LDL 98 HgbA1c 7.1 VTE prophylaxis - SCD's  No antithrombotic prior to admission, now on aspirin 81 mg daily .  Consider transition to Eliquis in about 5 to 7 days Therapy recommendations:  Pending Disposition:  pending   Atrial fibrillation Home Meds: None Continue telemetry monitoring Begin anticoagulation with aspirin 81 mg.  Consider changing to DOAC in 5 to 7 days due to hemorrhage  Hypertension Home meds: None Stable Blood Pressure Goal: SBP less than 160   Hyperlipidemia Home meds: None LDL 98, goal < 70 Add Crestor 20 Continue statin at discharge  Diabetes type II UnControlled Home meds: None HgbA1c 7.1, goal < 7.0 CBGs SSI Recommend close follow-up with PCP for better DM control  Dysphagia Patient has post-stroke dysphagia, SLP consulted    Diet   Diet regular Fluid consistency: Thin   Advance diet as tolerated  Other Stroke Risk Factors  ETOH use, alcohol level <10, advised to drink no more  than 2 drink(s) a day Obesity, Body mass index is 30.35 kg/m., BMI >/= 30 associated with increased stroke risk, recommend weight loss, diet and exercise as appropriate  OSA on CPAP  Other Active Problems Hypothyroidism GERD BPH  Hospital day # 2 Patient presented with right hand weakness and numbness secondary to hemorrhagic left frontal MCA branch infarct along with 2 other infarcts noted on MRI likely from atrial fibrillation not on anticoagulation.  Recommend continue ongoing stroke workup.  Continue aspirin  81 mg daily for now and switch to anticoagulation with Eliquis after 1 week.   Continue ongoing therapies.  Likely discharge home later today.  Follow-up as an outpatient in stroke clinic in 2 months.  Long discussion with patient and wife and daughter at the bedside about risk-benefit of anticoagulation in the setting of A-fib and  recent hemorrhage and answering questions. Greater than 50% time during this 35-minute visit was spent on counseling and coordination of care about his hemorrhagic infarcts and atrial fibrillation and discussion about risk-benefit of anticoagulation and answering questions.  Discussed with Dr. Raynaldo Opitz, MD Medical Director [facility] Pager: [phone] 11/06/2023 3:12 PM   To contact Stroke Continuity provider, please refer to [email or web address]. After hours, contact General Neurology

## 2023-11-06 NOTE — Progress Notes (Signed)
Patient discharged and given discharge instructions on medication changes. IV removed without incident. All patients belongings given to patient. Discharged home with wife in personal vehicle.

## 2023-11-06 NOTE — TOC Benefit Eligibility Note (Signed)
Pharmacy Patient Advocate Encounter  Insurance verification completed.    The patient is insured through [employer] test claim for Eliquis. Currently a quantity of 60 is a 30 day supply and the co-pay is $47.00 .   This test claim was processed through [facility]- copay amounts may vary at other pharmacies due to pharmacy/plan contracts, or as the patient moves through the different stages of their insurance plan.

## 2023-11-06 NOTE — Discharge Summary (Addendum)
Physician Discharge Summary   Patient: Ronald Kelley MRN: [id number] DOB: 1941-04-25  Admit date:     11/04/2023  Discharge date: 11/06/23  Discharge Physician: Meredeth Ide   PCP: [name], MD   Recommendations at discharge:   Follow-up PCP in 1 week Follow-up neurology in 4 weeks  Discharge Diagnoses: Principal Problem:   Subarachnoid hematoma (HCC) Active Problems:   OSA on CPAP   H/O ATRIAL FIBRILLATION, PAROXYSMAL   GERD (gastroesophageal reflux disease)   Diet-controlled diabetes mellitus (HCC)   BPH associated with nocturia   Hyperlipidemia associated with type 2 diabetes mellitus (HCC)   Hypertension   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 *  Hospital Course:  82 y.o. male with medical history significant of hypertension, hyperlipidemia, diabetes, BPH, GERD, paroxysmal defibrillation, OSA, hyperparathyroidism presenting with right-sided weakness and slurred speech.  CT head at 00:45 showed small acute subarachnoid hemorrhage in the left.  CT angiogram of the head at 05 100 showed grossly stable left middle gyrus subarachnoid hemorrhage, and noted some moderate stenosis of the left P2, 3, 4 arteries.  Neurosurgery recommended admission, Keppra for seizure prophylaxis, systolic BP less than 150    Assessment and Plan:  Hemorrhagic stroke Subarachnoid hemorrhage -Presented with right-sided numbness and weakness with slurred speech -Found to have small subarachnoid hemorrhage -Neurosurgery consulted, repeat CT head was performed, no surgical intervention recommended -Neurology consulted, stroke workup initiated > Patient presented with right-sided numbness and weakness as well as slurred speech noted to have small left subarachnoid hemorrhage. > Neurosurgery recommended repeat CT after 6 hours, the repeat was performed it was after 4.5 hours.  They also  recommended Keppra for seizure prophylaxis and to keep systolic BP less than 150. - Continue with Keppra 500 mg twice daily -Echocardiogram showed EF of 60 to 65% with grade 1 diastolic dysfunction, left atrium moderately dilated -EEG suggestive of cortical dysfunction arising from left temporal region likely from underlying structural abnormality from [facility], no seizures or epileptiform discharges seen throughout the recording. -Started on aspirin on 11/05/2023 for 7 days till 11/12/2023 -Start Eliquis from 11/13/2023 -PT evaluation obtained, no PT follow-up needed.  Patient ambulating in the hallway.  Hypokalemia -Replete -Potassium is 4.3    Hypertension - Not currently on any outpatient antihypertensives due to patient preference per med rec review -He was supposed to be taking valsartan 40 mg daily -Will restart valsartan 40 mg daily   Hyperlipidemia Started on rosuvastatin   Diabetes -Hemoglobin A1c 7.1 - Currently diet controlled -Follow-up with PCP for further recommendations regarding starting oral hypoglycemic agents   BPH GERD - Not currently on any medications for these   Paroxysmal atrial fibrillation - History of this but not currently on any anticoagulation and currently in sinus rhythm. -Neurology recommends to switch to Eliquis in 7 days from 11/13/2023        Consultants: Neurology Procedures performed: Echocardiogram Disposition: Home Diet recommendation:  Discharge Diet Orders (From admission, onward)     Start     Ordered   11/06/23 0000  Diet - low sodium heart healthy        11/06/23 1428           Regular diet DISCHARGE MEDICATION: Allergies as of 11/06/2023       Reactions   Hydrocodone-acetaminophen Other (See Comments)   REACTION: atrial fibrilation   Oxycodone Other (See Comments)   Atrial fibrilation  Oxycodone-acetaminophen Other (See Comments)   REACTION: atrial fibrilation   ok Darvocet        Medication List      TAKE these medications    apixaban 5 MG Tabs tablet Commonly known as: ELIQUIS Take 1 tablet (5 mg total) by mouth 2 (two) times daily. Start Eliquis after stopping aspirin Start taking on: November 13, 2023   aspirin 81 MG chewable tablet Chew 1 tablet (81 mg total) by mouth daily for 5 days. Take Aspirin till 11/12/23, then stop aspirin and start Eliquis from 11/13/23 Start taking on: November 07, 2023   levETIRAcetam 500 MG tablet Commonly known as: KEPPRA Take 1 tablet (500 mg total) by mouth 2 (two) times daily.   levothyroxine 50 MCG tablet Commonly known as: SYNTHROID TAKE 1 TABLET BY MOUTH EVERY DAY BEFORE BREAKFAST   rosuvastatin 20 MG tablet Commonly known as: CRESTOR Take 1 tablet (20 mg total) by mouth daily. Start taking on: November 07, 2023   timolol 0.5 % ophthalmic solution Commonly known as: TIMOPTIC Place 1 drop into both eyes daily.   Tylenol 325 MG Caps Generic drug: Acetaminophen Take 650 mg by mouth as needed.   valsartan 40 MG tablet Commonly known as: DIOVAN Take 1 tablet (40 mg total) by mouth daily.   vitamin C 100 MG tablet Take 100 mg by mouth daily.   Vitamin D3 125 MCG (5000 UT) Tabs 5,000 IU OTC vitamin D3 daily. What changed:  when to take this additional instructions   ZINC 15 PO Take 1 tablet by mouth daily.        Discharge Exam: Filed Weights   11/04/23 0029  Weight: 71.7 kg   General-appears in no acute distress Heart-S1-S2, regular, no murmur auscultated Lungs-clear to auscultation bilaterally, no wheezing or crackles auscultated Abdomen-soft, nontender, no organomegaly Extremities-no edema in the lower extremities Neuro-alert, oriented x3, no focal deficit noted  Condition at discharge: good  The results of significant diagnostics from this hospitalization (including imaging, microbiology, ancillary and laboratory) are listed below for reference.   Imaging Studies: CT HEAD WO CONTRAST ( )  Result Date:  11/06/2023 CLINICAL DATA:  Stroke, hemorrhagic EXAM: CT HEAD WITHOUT CONTRAST TECHNIQUE: Contiguous axial images were obtained from the base of the skull through the vertex without intravenous contrast. RADIATION DOSE REDUCTION: This exam was performed according to the departmental dose-optimization program which includes automated exposure control, adjustment of the mA and/or kV according to patient size and/or use of iterative reconstruction technique. COMPARISON:  Head CT 11/04/23, Brain MRI 11/04/23 FINDINGS: Brain: Increased subarachnoid hemorrhage in the left frontal lobe. No evidence of intraventricular extension. No new sites of hemorrhage. No mass effect. No mass lesion. No CT evidence of an acute cortical infarct. Additional infarcts seen on recent brain MRI are not visualized on this exam due to CT technique. Vascular: No hyperdense vessel or unexpected calcification. Skull: Normal. Negative for fracture or focal lesion. Sinuses/Orbits: No middle ear or mastoid effusion. Paranasal sinuses are clear. Bilateral lens replacement. Orbits are otherwise unremarkable. Other: None. IMPRESSION: Increased subarachnoid hemorrhage in the left frontal lobe. No evidence of intraventricular extension. No new sites of hemorrhage. Electronically Signed   By: Lorenza Cambridge M.D.   On: 11/06/2023 11:56   EEG adult  Result Date: 11/05/2023 Charlsie Quest, MD     11/05/2023  8:21 AM Patient Name: Ronald Kelley MRN: [id number] Epilepsy Attending: Charlsie Quest Referring Physician/Provider: Erick Blinks, MD Date: 11/05/2023 Duration: 23.59 mins Patient history: 82 yo  male with small acute nontraumatic L convexity SAH. EEG to evaluate for seizure Level of alertness: Awake AEDs during EEG study: LEV Technical aspects: This EEG study was done with scalp electrodes positioned according to the 10-20 International system of electrode placement. Electrical activity was reviewed with band pass filter of 1-70Hz , sensitivity  of 7 uV/mm, display speed of 96mm/sec with a 60Hz  notched filter applied as appropriate. EEG data were recorded continuously and digitally stored.  Video monitoring was available and reviewed as appropriate. Description: The posterior dominant rhythm consists of 8 Hz activity of moderate voltage (25-35 uV) seen predominantly in posterior head regions, symmetric and reactive to eye opening and eye closing. EEG showed continuous 3 to 6 Hz theta-delta slowing in left temporal region. Hyperventilation and photic stimulation were not performed.   ABNORMALITY - Continuous slow, left temporal region IMPRESSION: This study is suggestive of cortical dysfunction arising from left temporal region likely secondary to underlying structural abnormality/ SAH. No seizures or epileptiform discharges were seen throughout the recording. Charlsie Quest   MR BRAIN WO CONTRAST  Result Date: 11/05/2023 CLINICAL DATA:  Aphasia and numbness EXAM: MRI HEAD WITHOUT CONTRAST TECHNIQUE: Multiplanar, multiecho pulse sequences of the brain and surrounding structures were obtained without intravenous contrast. COMPARISON:  Head CT 11/04/2023 FINDINGS: Brain: Small area of subarachnoid hemorrhage within the left central sulcus is again noted. There is mild edema of the adjacent brain. There are small foci of abnormal diffusion restriction within the left periatrial white matter and the right occipital lobe. There is multifocal hyperintense T2-weighted signal within the periventricular and deep white matter. Normal CSF spaces. Vascular: Normal flow voids Skull and upper cervical spine: Normal marrow signal. Sinuses/Orbits: Paranasal sinuses are clear. No mastoid effusion. Normal orbits. Ocular lens replacements. Other: None IMPRESSION: 1. Small acute infarcts within the left periatrial white matter and the right occipital lobe. 2. Small area of subarachnoid hemorrhage within the left central sulcus with mild edema of the adjacent brain.  Allowing for different modality, unchanged. Electronically Signed   By: Deatra Robinson M.D.   On: 11/05/2023 01:51   CT Angio Head W or Wo Contrast  Result Date: 11/04/2023 CLINICAL DATA:  82 year old male code stroke presentation, right hand weakness. Left convexity subarachnoid hemorrhage on plain head CT. EXAM: CT ANGIOGRAPHY HEAD TECHNIQUE: Multidetector CT imaging of the head was performed using the standard protocol during bolus administration of intravenous contrast. Multiplanar CT image reconstructions and MIPs were obtained to evaluate the vascular anatomy. RADIATION DOSE REDUCTION: This exam was performed according to the departmental dose-optimization program which includes automated exposure control, adjustment of the mA and/or kV according to patient size and/or use of iterative reconstruction technique. CONTRAST:  75mL OMNIPAQUE IOHEXOL 350 MG/ML SOLN COMPARISON:  Plain head CT 0125 hours today. FINDINGS: Posterior circulation: Patent distal vertebral arteries, fairly codominant. No significant distal vertebral plaque or stenosis and patent vertebrobasilar junction. Both PICA origins appear patent and normal. Patent basilar artery without stenosis. Normal SCA and PCA origins. Posterior communicating arteries are diminutive or absent. Right PCA branches are within normal limits. On the left there is moderate proximal P2 irregularity and stenosis (series 8, image 20), and moderate P3/P4 segment irregularity and stenosis on the left (series 13, image 25) with preserved distal enhancement. Anterior circulation: Distal cervical ICAs are patent without stenosis. Mild distal left ICA tortuosity below the skull base. Left ICA siphon is patent with no significant plaque or stenosis. Right siphon is patent with no significant plaque or stenosis.  Normal ophthalmic artery origins. Patent carotid termini. Normal MCA and ACA origins. Normal anterior communicating artery. Bilateral ACA branches are within normal  limits. Right MCA M1 segment and trifurcation are patent without stenosis. Right MCA branches are within normal limits. Left MCA M1 segment and bifurcation are patent without stenosis. Simplified left MCA branching pattern. No left MCA branch occlusion is identified and left MCA branches are within normal limits. Venous sinuses: Early contrast timing, not well evaluated. Anatomic variants: None. Other findings: Trace left middle frontal gyrus subarachnoid hemorrhage grossly stable on series 6, image 53 and series 10, image 105. No discrete abnormal vessels there. No intracranial mass effect or ventriculomegaly. Review of the MIP images confirms the above findings IMPRESSION: 1. Negative for age CTA of the anterior circulation. Left MCA branches are within normal limits. No large vessel occlusion or intracranial aneurysm identified. 2. Trace left middle frontal gyrus subarachnoid hemorrhage grossly stable. No evidence of a vascular malformation. 3. Left PCA posterior circulation atherosclerosis with up to moderate stenosis of the left P2 and P3/P4 segments. Electronically Signed   By: Odessa Fleming M.D.   On: 11/04/2023 05:39   CT HEAD CODE STROKE WO CONTRAST`  Result Date: 11/04/2023 CLINICAL DATA:  Code stroke.  Right hand weakness EXAM: CT HEAD WITHOUT CONTRAST TECHNIQUE: Contiguous axial images were obtained from the base of the skull through the vertex without intravenous contrast. RADIATION DOSE REDUCTION: This exam was performed according to the departmental dose-optimization program which includes automated exposure control, adjustment of the mA and/or kV according to patient size and/or use of iterative reconstruction technique. COMPARISON:  None Available. FINDINGS: Brain: Small focus of acute subarachnoid hemorrhage over the left convexity. No other acute hemorrhage. Hypoattenuation of the white matter. Mild volume loss. Vascular: No hyperdense vessel or unexpected calcification. Skull: Normal. Negative for  fracture or focal lesion. Sinuses/Orbits: No acute finding. Other: None. IMPRESSION: 1. Small focus of acute subarachnoid hemorrhage over the left convexity. 2. Chronic small vessel ischemia and volume loss. Critical Value/emergent results were called by telephone at the time of interpretation on 11/04/2023 at 1:35 am to provider Geoffery Lyons , who verbally acknowledged these results. Electronically Signed   By: Deatra Robinson M.D.   On: 11/04/2023 01:35   DG Knee Complete 4 Views Left  Result Date: 10/09/2023 CLINICAL DATA:  Rolled out of bed, fall.  Left knee pain. EXAM: LEFT KNEE - COMPLETE 4+ VIEW COMPARISON:  None Available. FINDINGS: No evidence of fracture, dislocation, or joint effusion. Advanced chondrocalcinosis. There are capsular calcifications. No erosive change. Soft tissue calcification adjacent to the medial femoral condyle may represent prior MCL injury. Soft tissues are unremarkable. IMPRESSION: 1. No fracture or subluxation of the left knee. 2. Advanced chondrocalcinosis. Electronically Signed   By: Narda Rutherford M.D.   On: 10/09/2023 15:20   CT Head Wo Contrast  Result Date: 10/09/2023 CLINICAL DATA:  Rolled out of bed onto hardwood floor, head and neck pain EXAM: CT HEAD WITHOUT CONTRAST CT CERVICAL SPINE WITHOUT CONTRAST TECHNIQUE: Multidetector CT imaging of the head and cervical spine was performed following the standard protocol without intravenous contrast. Multiplanar CT image reconstructions of the cervical spine were also generated. RADIATION DOSE REDUCTION: This exam was performed according to the departmental dose-optimization program which includes automated exposure control, adjustment of the mA and/or kV according to patient size and/or use of iterative reconstruction technique. COMPARISON:  None Available. FINDINGS: CT HEAD FINDINGS Brain: No evidence of acute infarct, hemorrhage, mass, mass effect, or  midline shift. No hydrocephalus or extra-axial fluid collection.  Vascular: No hyperdense vessel. Skull: Negative for fracture or focal lesion. Right frontal scalp hematoma. Sinuses/Orbits: No acute finding. Status post bilateral lens replacements. Other: The mastoid air cells are well aerated. CT CERVICAL SPINE FINDINGS Alignment: No traumatic listhesis. Trace anterolisthesis of C4 on C5, C7 on T1, T1 on T2, and T2 on T3, which appear facet mediated. Skull base and vertebrae: No acute fracture or suspicious osseous lesion. Degenerative pannus formation about the dens. Soft tissues and spinal canal: No prevertebral fluid or swelling. No visible canal hematoma. Disc levels: Degenerative changes in the cervical spine.Moderate spinal canal stenosis at C5-C6. Upper chest: No focal pulmonary opacity or pleural effusion. IMPRESSION: 1. No acute intracranial process. Right frontal scalp hematoma. 2. No acute fracture or traumatic listhesis in the cervical spine. Electronically Signed   By: Wiliam Ke M.D.   On: 10/09/2023 13:54   CT Cervical Spine Wo Contrast  Result Date: 10/09/2023 CLINICAL DATA:  Rolled out of bed onto hardwood floor, head and neck pain EXAM: CT HEAD WITHOUT CONTRAST CT CERVICAL SPINE WITHOUT CONTRAST TECHNIQUE: Multidetector CT imaging of the head and cervical spine was performed following the standard protocol without intravenous contrast. Multiplanar CT image reconstructions of the cervical spine were also generated. RADIATION DOSE REDUCTION: This exam was performed according to the departmental dose-optimization program which includes automated exposure control, adjustment of the mA and/or kV according to patient size and/or use of iterative reconstruction technique. COMPARISON:  None Available. FINDINGS: CT HEAD FINDINGS Brain: No evidence of acute infarct, hemorrhage, mass, mass effect, or midline shift. No hydrocephalus or extra-axial fluid collection. Vascular: No hyperdense vessel. Skull: Negative for fracture or focal lesion. Right frontal scalp  hematoma. Sinuses/Orbits: No acute finding. Status post bilateral lens replacements. Other: The mastoid air cells are well aerated. CT CERVICAL SPINE FINDINGS Alignment: No traumatic listhesis. Trace anterolisthesis of C4 on C5, C7 on T1, T1 on T2, and T2 on T3, which appear facet mediated. Skull base and vertebrae: No acute fracture or suspicious osseous lesion. Degenerative pannus formation about the dens. Soft tissues and spinal canal: No prevertebral fluid or swelling. No visible canal hematoma. Disc levels: Degenerative changes in the cervical spine.Moderate spinal canal stenosis at C5-C6. Upper chest: No focal pulmonary opacity or pleural effusion. IMPRESSION: 1. No acute intracranial process. Right frontal scalp hematoma. 2. No acute fracture or traumatic listhesis in the cervical spine. Electronically Signed   By: Wiliam Ke M.D.   On: 10/09/2023 13:54    Microbiology: Results for orders placed or performed in visit on 08/18/22  Urine Culture     Status: None   Collection Time: 08/18/22  4:47 PM   Specimen: Urine   Urine  Result Value Ref Range Status   Urine Culture, Routine Final report  Final   Organism ID, Bacteria Comment  Final    Comment: Culture shows less than 10,000 colony forming units of bacteria per milliliter of urine. This colony count is not generally considered to be clinically significant.     Labs: CBC: Recent Labs  Lab 11/04/23 0037 11/04/23 1523 11/05/23 0439  WBC 11.1* 8.8 10.7*  NEUTROABS 6.0  --   --   HGB 13.9 15.4 13.9  HCT 41.8 46.5 42.3  MCV 92.3 92.6 90.6  PLT 161 112* 147*   Basic Metabolic Panel: Recent Labs  Lab 11/04/23 0037 11/05/23 0439  NA 140 137  K 3.6 3.1*  CL 102 102  CO2 29  25  GLUCOSE 131* 128*  BUN 25* 15  CREATININE 1.00 0.94  CALCIUM 9.7 8.8*   Liver Function Tests: Recent Labs  Lab 11/04/23 0037 11/05/23 0439  AST 14* 18  ALT 14 14  ALKPHOS 82 86  BILITOT 1.3* 2.3*  PROT 7.4 6.2*  ALBUMIN 4.2 3.3*    CBG: Recent Labs  Lab 11/04/23 0049  GLUCAP 147*    Discharge time spent: greater than 30 minutes.  Signed: Meredeth Ide, MD [facility] 11/06/2023

## 2023-11-09 ENCOUNTER — Telehealth: Payer: Self-pay | Admitting: *Deleted

## 2023-11-09 NOTE — Telephone Encounter (Signed)
Contacted pt and appointment scheduled.     Copied from CRM [id number]. Topic: Appointments - Appointment Scheduling >> Nov 09, 2023 10:22 AM Dimitri Ped wrote: Patient/patient representative is calling to schedule an appointment. Refer to attachments for appointment information. Patient wife is calling for patient because he had a mild stroke and the hospital told him to follow up with primary but no appointments were available until January 8th please give a call to get scheduled . Call back number [phone] patricia the wife

## 2023-11-10 ENCOUNTER — Encounter: Payer: Self-pay | Admitting: Family Medicine

## 2023-11-10 ENCOUNTER — Ambulatory Visit (INDEPENDENT_AMBULATORY_CARE_PROVIDER_SITE_OTHER): Payer: Medicare HMO | Admitting: Family Medicine

## 2023-11-10 VITALS — BP 138/72 | HR 58 | Ht 60.05 in | Wt 148.8 lb

## 2023-11-10 DIAGNOSIS — I10 Essential (primary) hypertension: Secondary | ICD-10-CM | POA: Diagnosis not present

## 2023-11-10 DIAGNOSIS — I480 Paroxysmal atrial fibrillation: Secondary | ICD-10-CM

## 2023-11-10 DIAGNOSIS — E785 Hyperlipidemia, unspecified: Secondary | ICD-10-CM | POA: Diagnosis not present

## 2023-11-10 DIAGNOSIS — I69392 Facial weakness following cerebral infarction: Secondary | ICD-10-CM | POA: Insufficient documentation

## 2023-11-10 DIAGNOSIS — E1169 Type 2 diabetes mellitus with other specified complication: Secondary | ICD-10-CM

## 2023-11-10 NOTE — Assessment & Plan Note (Signed)
Patient is taking rosuvastatin since discharge from the hospital.  Overall without medication his LDL levels have been around 100 or lower.  Now that he has had a stroke, recommendations are for tighter control.  Also has diabetes.  Patient is taking his rosuvastatin now that he has been discharged from the hospital.

## 2023-11-10 NOTE — Assessment & Plan Note (Signed)
Patient taking aspirin currently.  On 1217 he is to switch to Eliquis per neurology recommendations for presumed cardioembolic stroke secondary to his atrial fibrillation.  Will send in referral to cardiology.  Patient can discuss further management options with them.  He has been reluctant to start medications in the past prior to his recent stroke.

## 2023-11-10 NOTE — Patient Instructions (Addendum)
It was nice to see you today,  We addressed the following topics today: -I will send in a referral to the cardiologist so he can evaluate you to see if there are any other appropriate treatments for your A-fib - I will send in a referral to the speech pathologist. - For both of these referrals someone should call you.  If you have not heard from somebody in 2 weeks let us know - Your blood pressure goal should be as close to 130/85 as you can tolerate with out side effects from medications.  You should not have very many blood pressure readings over 140 or over 90 diastolic.    Have a great day,  Frederic Jericho, MD

## 2023-11-10 NOTE — Assessment & Plan Note (Signed)
Right-sided facial droop with some difficulty chewing.  Interfering with his ability to eat solid foods.  Patient agreeable to speech therapy for evaluation and treatment.

## 2023-11-10 NOTE — Assessment & Plan Note (Signed)
Better controlled today.  He is currently taking valsartan.  Had not taken the valsartan we have prescribed prior to his hospitalization.

## 2023-11-10 NOTE — Progress Notes (Signed)
Established Patient Office Visit  Subjective   Patient ID: Ronald Kelley, male    DOB: July 17, 1941  Age: 82 y.o. MRN: [id number]  Chief Complaint  Patient presents with   Hospitalization Follow-up    HPI  Hospital f/u -patient states he still has some weakness in the right hand and some weakness in the right side is states that he notices when he is trying to chew food.  States that when he tries to eat solid food he will bite his cheek.  Has been eating soft foods and pured foods.  We discussed speech therapy.  He does not have any difficulty understanding words or getting words out.  No dysarthria.  We discussed his lab results, imaging results.  He is compliant with the medications prescribed at discharge including rosuvastatin and valsartan.  Patient asked if there is any need to see a cardiologist.  We discussed in the setting of his stroke and A-fib they may be able to discuss other options for treatment such as left atrial appendage closure or other procedures to help reduce risk of drug.  Patient understands he needs to switch to Eliquis on the 17th from aspirin per neurology recommendations.   The ASCVD Risk score (Arnett DK, et al., 2019) failed to calculate for the following reasons:   The 2019 ASCVD risk score is only valid for ages 77 to 83  Health Maintenance Due  Topic Date Due   Zoster Vaccines- Shingrix (1 of 2) Never done   Pneumonia Vaccine 102+ Years old (2 of 2 - PCV) 06/23/2009   INFLUENZA VACCINE  Never done   COVID-19 Vaccine (1 - 2023-24 season) Never done      Objective:     BP 138/72   Pulse (!) 58   Ht 5' 0.05" (1.525 m)   Wt 148 lb 12.8 oz (67.5 kg)   SpO2 98%   BMI 29.01 kg/m    Physical Exam General: Alert, oriented HEENT: Mild right-sided facial droop CV: Regular rate and rhythm Pulmonary: Lungs clear bilaterally   No results found for any visits on 11/10/23.      Assessment & Plan:   Hyperlipidemia associated with type 2  diabetes mellitus (HCC) Assessment & Plan: Patient is taking rosuvastatin since discharge from the hospital.  Overall without medication his LDL levels have been around 100 or lower.  Now that he has had a stroke, recommendations are for tighter control.  Also has diabetes.  Patient is taking his rosuvastatin now that he has been discharged from the hospital.   Paroxysmal A-fib [facility]) Assessment & Plan: Patient taking aspirin currently.  On 1217 he is to switch to Eliquis per neurology recommendations for presumed cardioembolic stroke secondary to his atrial fibrillation.  Will send in referral to cardiology.  Patient can discuss further management options with them.  He has been reluctant to start medications in the past prior to his recent stroke.  Orders: -     Ambulatory referral to Cardiology  Primary hypertension Assessment & Plan: Better controlled today.  He is currently taking valsartan.  Had not taken the valsartan we have prescribed prior to his hospitalization.   Facial droop as late effect of cerebrovascular accident (CVA) Assessment & Plan: Right-sided facial droop with some difficulty chewing.  Interfering with his ability to eat solid foods.  Patient agreeable to speech therapy for evaluation and treatment.  Orders: -     Ambulatory referral to Speech Therapy     Return in about  2 months (around 01/11/2024) for HTN, hld.    [name], MD

## 2023-11-11 ENCOUNTER — Inpatient Hospital Stay: Payer: Medicare HMO | Admitting: Family Medicine

## 2023-12-01 ENCOUNTER — Encounter: Payer: Self-pay | Admitting: Speech Pathology

## 2023-12-01 ENCOUNTER — Ambulatory Visit: Payer: Medicare HMO | Attending: Family Medicine | Admitting: Speech Pathology

## 2023-12-01 DIAGNOSIS — I69392 Facial weakness following cerebral infarction: Secondary | ICD-10-CM | POA: Insufficient documentation

## 2023-12-01 DIAGNOSIS — R1311 Dysphagia, oral phase: Secondary | ICD-10-CM | POA: Insufficient documentation

## 2023-12-01 NOTE — Therapy (Signed)
 OUTPATIENT SPEECH LANGUAGE PATHOLOGY SWALLOW EVALUATION   Patient Name: Ronald Kelley MRN: [id number] DOB:[birth year], 82 y.o., male Today's Date: 12/01/2023  PCP: Flynn Toribio POUR, MD REFERRING PROVIDER: Flynn Toribio POUR, MD  END OF SESSION:  End of Session - 12/01/23 1226     Visit Number 1    Number of Visits 1    SLP Start Time 1100    SLP Stop Time  1145    SLP Time Calculation (min) 45 min    Activity Tolerance Patient tolerated treatment well             Past Medical History:  Diagnosis Date   Atrial fib/flutter, transient (HCC)    Degenerative disk disease    Diabetes mellitus    a1c 6.5, 07/2008   GERD (gastroesophageal reflux disease)    History of inguinal hernia repair, bilateral 03/23/2019   History of kidney stones    History of vitamin D  deficiency 09/27/2016   Hyperthyroidism    remote h/o , no ablation   OSA on CPAP    Past Surgical History:  Procedure Laterality Date   HERNIA REPAIR  2010   L5 acute HNP     s/p surgery 09/21/08-- still has occasional paretheisa of the left foot    PARATHYROIDECTOMY Right 01/22/2023   Procedure: RIGHT INFERIOR PARATHYROIDECTOMY;  Surgeon: Eletha Boas, MD;  Location: [facility];  Service: General;  Laterality: Right;   ROTATOR CUFF REPAIR     Patient Active Problem List   Diagnosis Date Noted   Facial droop as late effect of cerebrovascular accident (CVA) 11/10/2023   Hemorrhagic infarction involving posterior cerebral circulation of left side (HCC) 11/05/2023   Subarachnoid hematoma (HCC) 11/04/2023   Full thickness rotator cuff tear 06/03/2023   Nocturnal polyuria 05/05/2023   Hypertension 02/23/2023   Ganglion cyst of volar aspect of right wrist 02/26/2021   Mass of joint of right wrist 11/18/2020   Palpitations 12/22/2019   Closed nondisplaced fracture of pelvis with routine healing 08/31/2019   Weakness generalized 08/31/2019   Elevated serum creatinine 08/31/2019   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    Environmental and seasonal allergies 08/31/2019   Ibuprofen  adverse reaction 08/31/2019   Orthostatic hypotension 08/31/2019   Vitamin D  deficiency 03/23/2019   Tear of medial meniscus of knee 08/10/2018   Elevated serum free T4 level 08/01/2017   Low TSH level 08/01/2017   Hyperlipidemia associated with type 2 diabetes mellitus (HCC) 11/25/2016   h/o Leukopenia 11/05/2016   Drug-induced low platelet count 11/05/2016   Impaired fasting glucose 09/27/2016   Degenerative disk disease 09/27/2016   Presbycusis 09/27/2016   Diet-controlled diabetes mellitus (HCC) 09/24/2016   BPH associated with nocturia 09/24/2016   h/o Osteopenia 07/30/2014   GERD (gastroesophageal reflux disease) 04/28/2013   Erectile dysfunction 10/08/2011   Elevated PSA/ nocturia sx 09/15/2011   OSA on CPAP 05/13/2010   Paroxysmal A-fib (HCC) 09/28/2008   Inguinal hernia 06/23/2008   Hyperparathyroidism, primary (HCC) 06/23/2008    ONSET DATE: 11/10/2023 referral    REFERRING DIAG: P30.607 (ICD-10-CM) - Facial droop as late effect of cerebrovascular accident (CVA)   THERAPY DIAG:  Oral phase dysphagia  Rationale for Evaluation and Treatment: Rehabilitation  SUBJECTIVE:   SUBJECTIVE STATEMENT: Pt reports improvement in chewing since stroke, has increased range of solids consuming, less biting of R cheek Pt accompanied by: significant other  PERTINENT HISTORY:  82 y.o. male who presented 12/04 with R sided weakness and slurred speech. He was evaluated emergently by teleneurology  as a code stroke. He was noted to have R hemianopsia, R facial palsy, sensory changes and slurred speech. MRI brain revealed L MCA SAH, watershed strokes. PMH significant for afibb not on AC, DM2, GERD, BPH, OSA, hyperparathyroidism.   PAIN:  Are you having pain? No  FALLS: Has patient fallen in last 6 months?  No  LIVING ENVIRONMENT: Lives with: lives with their spouse Lives in: House/apartment  PLOF:  Level of  assistance: Independent with ADLs, Independent with IADLs Employment: Retired  PATIENT GOALS: none stated  OBJECTIVE:  Note: Objective measures were completed at Evaluation unless otherwise noted. OBJECTIVE:   INSTRUMENTAL SWALLOW STUDY FINDINGS: not completed or recommended while admitted    COGNITION: Overall cognitive status: Within functional limits for tasks assessed  SUBJECTIVE DYSPHAGIA REPORTS:  Date of onset: November 04, 2023 Reported symptoms: difficulty chewing foods  Current diet: Dysphagia 3 (mechanical soft) and thin liquids  Co-morbid voice changes: No  FACTORS WHICH MAY INCREASE RISK OF ADVERSE EVENT IN PRESENCE OF ASPIRATION:  General health: well appearing  Risk factors: none evident     ORAL MOTOR EXAMINATION: Overall status: WFL Comments: mild facial droop at rest, R  CLINICAL SWALLOW ASSESSMENT:   Dentition: adequate natural dentition Vocal quality at baseline: normal Patient directly observed with POs: Yes: regular, dysphagia 3 (soft), dysphagia 2 (chopped), and thin liquids  Feeding: able to feed self Liquids provided by: cup [facility] Swallow Protocol: Pass Oral phase signs and symptoms: prolonged mastication Pharyngeal phase signs and symptoms:  none                                                                                                                           TREATMENT DATE:  12/01/2023: Education on HEP pt can complete at home-- practice mastication on R side with solid boluses, reduce distractions and focus on sensation and motor movement of chewing. Use increasingly challenging solid textures or cold textures for increased sensory input. Diet modification: continue chopping for for ease of mastication, chew on L for meals, small sips/bites.    PATIENT EDUCATION: Education details: evaluation results, HEP Person educated: Patient and Spouse Education method: Medical Illustrator Education comprehension: verbalized understanding  and returned demonstration   ASSESSMENT:  CLINICAL IMPRESSION: Patient is a 82 y.o. M who was seen today for dysphagia evaluation s/p stroke. Evaluation reveals mild oral dysphagia with challenges chewing on R side d/t suspected reduced sensation s/p stroke. Mild R facial droop noted at rest. Pt does report sx to be improving since acute onset. Has increased range of solids consumed to include soft solids vs baseline ground/puree solids. No deviations noted in strength or ROM noted L vs R during OME. No overt s/sx aspiration seen across PO trials. Rec pt continue to expand diet, practice mastication during structured practice setting for R side, chew foods on L for meals and utilize strategies which have proven effective. No skilled interventions appear warranted.   OBJECTIVE IMPAIRMENTS: include oral dysphagia. These  impairments are limiting patient from enjoying full range of solids per baseline. Skilled ST is not indicated at this time 2/2 mild deficits which can be addressed via HEP.   PLAN:  SLP FREQUENCY: one time visit  SLP DURATION: other: eval and discharge  PLANNED INTERVENTIONS: Aspiration precaution training, Diet toleration management , and Patient/family education    Harlene LITTIE Ned, CCC-SLP 12/01/2023, 12:26 PM

## 2023-12-10 DIAGNOSIS — R931 Abnormal findings on diagnostic imaging of heart and coronary circulation: Secondary | ICD-10-CM | POA: Insufficient documentation

## 2023-12-10 NOTE — Progress Notes (Signed)
 Cardiology Office Note:   Date:  12/11/2023  ID:  SOU NOHR, DOB September 24, 1941, MRN [id number] PCP: Chandra Toribio POUR, MD  [facility] Providers Cardiologist:  Lynwood Schilling, MD {  History of Present Illness:   Ronald Kelley is a 83 y.o. male who presents for follow-up of palpitations.  He was noted to have atrial fibrillation in 2010 but at that time he did not want to take any anticoagulation.  When I saw him in 2020 I saw no documentation at that time of the atrial fibrillation and he did not had any documented since then.     He presented on 11 8 to the emergency room after falling out of bed. He had no fractures or bleeding at that time.  He presented to the emergency room on 11/04/2023 with right-sided weakness and numbness and some slurred speech.  He had labile blood pressures.  He had a subarachnoid hemorrhage.  Neurosurgery recommended conservative management.  Neurology saw the patient and he was thought probably to have an embolic etiology related to atrial fibrillation though I looked through the records and could not see any fibrillation on his monitor or EKGs.  He does not recall any palpitations.  He has not had any presyncope or syncope.  He has had an MRI.  This demonstrated small acute infarcts in the left parietal white matter and right occipital lobe.  An echocardiogram with well-preserved ejection fraction and his left atrium was moderately dilated.   ROS: As stated in the HPI and negative for all other systems.   Studies Reviewed:    EKG:   EKG Interpretation Date/Time:  Friday December 11 2023 09:02:26 EST Ventricular Rate:  53 PR Interval:  140 QRS Duration:  80 QT Interval:  412 QTC Calculation: 386 R Axis:   56  Text Interpretation: Sinus bradycardia T wave abnormality, consider inferior ischemia When compared with ECG of 04-Nov-2023 00:48, T wave inversion is new compared to previousl Confirmed by Schilling Lynwood ([id number]) on 12/11/2023 9:37:03 AM     Risk Assessment/Calculations:              Physical Exam:   VS:  BP 138/72   Pulse (!) 57   Ht 5' 5.5 (1.664 m)   Wt 153 lb 3.2 oz (69.5 kg)   SpO2 97%   BMI 25.11 kg/m    Wt Readings from Last 3 Encounters:  12/11/23 153 lb 3.2 oz (69.5 kg)  11/10/23 148 lb 12.8 oz (67.5 kg)  11/04/23 158 lb (71.7 kg)     GEN: Well nourished, well developed in no acute distress NECK: No JVD; No carotid bruits CARDIAC: RRR, no murmurs, rubs, gallops RESPIRATORY:  Clear to auscultation without rales, wheezing or rhonchi  ABDOMEN: Soft, non-tender, non-distended EXTREMITIES:  No edema; No deformity   ASSESSMENT AND PLAN:    Acute Ischemic Cerebral infarct:   The etiology is felt to be atrial fibrillation given the distant past history of this.  I cannot argue with that and so would continue anticoagulation.  I do not think there is any indication for monitoring him.  He is not having any contraindication to the anticoagulation.  He has had workup to include an echocardiogram.  No further imaging.    Atrial fibrillation: As above.  He was only taking his Eliquis  daily  and is educated that this is a bid medication.    Hypertension: His blood pressure is currently well-controlled.  No change in therapy.   Hyperlipidemia: His  LDL was 98.  Has been started on Crestor .  This can be checked again in 3 months by his primary provider.   Diabetes type II UnControlled: His A1c is 7.1.  I will defer to his primary provider for follow-up of this.  Abnormal EKG: Patient does have some T wave inversion in his inferior leads.  However, he has no symptoms.  No change on recent echo and he has had no obstructive CAD on couple of years ago.  No further testing at this time.   Follow up with me in one year.   Signed, Lynwood Schilling, MD

## 2023-12-11 ENCOUNTER — Ambulatory Visit: Payer: PPO | Attending: Cardiology | Admitting: Cardiology

## 2023-12-11 ENCOUNTER — Encounter: Payer: Self-pay | Admitting: Cardiology

## 2023-12-11 VITALS — BP 138/72 | HR 57 | Ht 65.5 in | Wt 153.2 lb

## 2023-12-11 DIAGNOSIS — R002 Palpitations: Secondary | ICD-10-CM

## 2023-12-11 DIAGNOSIS — R931 Abnormal findings on diagnostic imaging of heart and coronary circulation: Secondary | ICD-10-CM

## 2023-12-11 DIAGNOSIS — I480 Paroxysmal atrial fibrillation: Secondary | ICD-10-CM

## 2023-12-11 NOTE — Patient Instructions (Signed)
 Medication Instructions:  No changes.  *If you need a refill on your cardiac medications before your next appointment, please call your pharmacy*   Follow-Up: At [facility], you and your health needs are our priority.  As part of our continuing mission to provide you with exceptional heart care, we have created designated Provider Care Teams.  These Care Teams include your primary Cardiologist (physician) and Advanced Practice Providers (APPs -  Physician Assistants and Nurse Practitioners) who all work together to provide you with the care you need, when you need it.  We recommend signing up for the patient portal called "MyChart".  Sign up information is provided on this After Visit Summary.  MyChart is used to connect with patients for Virtual Visits (Telemedicine).  Patients are able to view lab/test results, encounter notes, upcoming appointments, etc.  Non-urgent messages can be sent to your provider as well.   To learn more about what you can do with MyChart, go to [email or web address].    Your next appointment:   12 month(s)  Provider:   Rollene Rotunda, MD

## 2023-12-21 ENCOUNTER — Other Ambulatory Visit: Payer: Self-pay | Admitting: Family Medicine

## 2023-12-21 DIAGNOSIS — R7989 Other specified abnormal findings of blood chemistry: Secondary | ICD-10-CM

## 2023-12-25 DIAGNOSIS — H5203 Hypermetropia, bilateral: Secondary | ICD-10-CM | POA: Diagnosis not present

## 2023-12-25 DIAGNOSIS — H401131 Primary open-angle glaucoma, bilateral, mild stage: Secondary | ICD-10-CM | POA: Diagnosis not present

## 2023-12-25 DIAGNOSIS — Z961 Presence of intraocular lens: Secondary | ICD-10-CM | POA: Diagnosis not present

## 2024-01-04 ENCOUNTER — Encounter: Payer: Self-pay | Admitting: Surgery

## 2024-01-21 ENCOUNTER — Ambulatory Visit: Payer: Medicare HMO | Admitting: Family Medicine

## 2024-01-22 ENCOUNTER — Encounter: Payer: Self-pay | Admitting: Family Medicine

## 2024-01-22 ENCOUNTER — Ambulatory Visit (INDEPENDENT_AMBULATORY_CARE_PROVIDER_SITE_OTHER): Payer: PPO | Admitting: Family Medicine

## 2024-01-22 VITALS — BP 133/65 | HR 67 | Ht 65.5 in | Wt 156.2 lb

## 2024-01-22 DIAGNOSIS — E785 Hyperlipidemia, unspecified: Secondary | ICD-10-CM

## 2024-01-22 DIAGNOSIS — I10 Essential (primary) hypertension: Secondary | ICD-10-CM | POA: Diagnosis not present

## 2024-01-22 DIAGNOSIS — E1169 Type 2 diabetes mellitus with other specified complication: Secondary | ICD-10-CM | POA: Diagnosis not present

## 2024-01-22 DIAGNOSIS — E119 Type 2 diabetes mellitus without complications: Secondary | ICD-10-CM | POA: Diagnosis not present

## 2024-01-22 DIAGNOSIS — E039 Hypothyroidism, unspecified: Secondary | ICD-10-CM | POA: Diagnosis not present

## 2024-01-22 NOTE — Assessment & Plan Note (Signed)
Advised patient to check to see if he is taking Crestor.  If not he should let us know and we will resend in the prescription but he has a prescription from December.  Discussed the importance of cholesterol in cardiovascular disease.  Recheck lipid panel in 2 weeks at lab visit.

## 2024-01-22 NOTE — Progress Notes (Signed)
   Established Patient Office Visit  Subjective   Patient ID: Ronald Kelley, male    DOB: 04/16/1941  Age: 83 y.o. MRN: [id number]  Chief Complaint  Patient presents with   Hypertension    HPI  Stroke-patient states that his poststroke weakness is much better.  Basically only has some slight numbness in left fingertips.  His difficulties with chewing have also improved significantly.  He never did see a speech therapist.  Wants to hold off now that his symptoms are better  Patient states he is taking his Eliquis.  He is taking his thyroid medicine, valsartan, zinc vitamin D.  He is unsure if he is taking Crestor or not.  We talked about why it is important for patients who have had strokes to take cholesterol medication.  We discussed his diabetes and also getting this under control.  States he would be willing to take a diabetes medication if his blood sugar was above goal at home and if I recommended it.   The ASCVD Risk score (Arnett DK, et al., 2019) failed to calculate for the following reasons:   The 2019 ASCVD risk score is only valid for ages 18 to 68  Health Maintenance Due  Topic Date Due   Zoster Vaccines- Shingrix (1 of 2) Never done   Pneumonia Vaccine 35+ Years old (2 of 2 - PCV) 06/23/2009   INFLUENZA VACCINE  Never done   COVID-19 Vaccine (1 - 2024-25 season) Never done      Objective:     BP 133/65   Pulse 67   Ht 5' 5.5" (1.664 m)   Wt 156 lb 4 oz (70.9 kg)   SpO2 97%   BMI 25.61 kg/m    Physical Exam General: Alert, oriented CV: Regular rate and rhythm no murmurs Pulmonary: Lungs clear bilaterally no wheeze or crackles.   No results found for any visits on 01/22/24.      Assessment & Plan:   Primary hypertension Assessment & Plan: Patient is taking his medication.  Blood pressure was at goal today.  Rechecking CMP at next lab visit.  Continue valsartan.  Orders: -     Comprehensive metabolic panel; Future  Diet-controlled diabetes  mellitus (HCC) Assessment & Plan: Will recheck in 2 weeks which will be 3 months since his last A1c was checked.  At that time if it is elevated patient states he would be agreeable to a medication if I recommended it.  We discussed SGLT2's.  Patient will not do metformin.  Orders: -     Microalbumin / creatinine urine ratio -     Hemoglobin A1c; Future  Hyperlipidemia associated with type 2 diabetes mellitus [facility]) Assessment & Plan: Advised patient to check to see if he is taking Crestor.  If not he should let us know and we will resend in the prescription but he has a prescription from December.  Discussed the importance of cholesterol in cardiovascular disease.  Recheck lipid panel in 2 weeks at lab visit.  Orders: -     CBC; Future -     Comprehensive metabolic panel; Future -     Lipid panel; Future  Hypothyroidism, unspecified type -     TSH; Future     Return in about 4 months (around 05/21/2024).    Ronald Kitty, MD

## 2024-01-22 NOTE — Assessment & Plan Note (Signed)
Will recheck in 2 weeks which will be 3 months since his last A1c was checked.  At that time if it is elevated patient states he would be agreeable to a medication if I recommended it.  We discussed SGLT2's.  Patient will not do metformin.

## 2024-01-22 NOTE — Assessment & Plan Note (Signed)
Patient is taking his medication.  Blood pressure was at goal today.  Rechecking CMP at next lab visit.  Continue valsartan.

## 2024-01-22 NOTE — Patient Instructions (Signed)
It was nice to see you today,  We addressed the following topics today: -I want you to look in your medicine cabinet or wherever you keep your medications to see if you have a medication called Crestor or rosuvastatin.  This is your cholesterol medicine and anybody who has had a stroke should be on a cholesterol medication like this -We will recheck your A1c and other lab tests in approximately 2 weeks.  I will let you know the results when I get them. - If we need to start a medication for diabetes and will likely be [place] or [place].  Have a great day,  Frederic Jericho, MD

## 2024-01-24 LAB — MICROALBUMIN / CREATININE URINE RATIO
Creatinine, Urine: 201.4 mg/dL
Microalb/Creat Ratio: 9 mg/g{creat} (ref 0–29)
Microalbumin, Urine: 19 ug/mL

## 2024-01-25 ENCOUNTER — Encounter: Payer: Self-pay | Admitting: Family Medicine

## 2024-02-04 ENCOUNTER — Other Ambulatory Visit: Payer: PPO

## 2024-02-04 ENCOUNTER — Other Ambulatory Visit

## 2024-02-05 ENCOUNTER — Other Ambulatory Visit

## 2024-02-05 DIAGNOSIS — E119 Type 2 diabetes mellitus without complications: Secondary | ICD-10-CM

## 2024-02-05 DIAGNOSIS — E039 Hypothyroidism, unspecified: Secondary | ICD-10-CM

## 2024-02-05 DIAGNOSIS — I10 Essential (primary) hypertension: Secondary | ICD-10-CM

## 2024-02-05 DIAGNOSIS — E1169 Type 2 diabetes mellitus with other specified complication: Secondary | ICD-10-CM

## 2024-02-05 DIAGNOSIS — E785 Hyperlipidemia, unspecified: Secondary | ICD-10-CM | POA: Diagnosis not present

## 2024-02-06 LAB — CBC
Hematocrit: 44.3 % (ref 37.5–51.0)
Hemoglobin: 14.9 g/dL (ref 13.0–17.7)
MCH: 30.3 pg (ref 26.6–33.0)
MCHC: 33.6 g/dL (ref 31.5–35.7)
MCV: 90 fL (ref 79–97)
Platelets: 147 10*3/uL — ABNORMAL LOW (ref 150–450)
RBC: 4.92 x10E6/uL (ref 4.14–5.80)
RDW: 12.4 % (ref 11.6–15.4)
WBC: 8.8 10*3/uL (ref 3.4–10.8)

## 2024-02-06 LAB — COMPREHENSIVE METABOLIC PANEL
ALT: 16 IU/L (ref 0–44)
AST: 16 IU/L (ref 0–40)
Albumin: 4.3 g/dL (ref 3.7–4.7)
Alkaline Phosphatase: 87 IU/L (ref 44–121)
BUN/Creatinine Ratio: 18 (ref 10–24)
BUN: 18 mg/dL (ref 8–27)
Bilirubin Total: 1.3 mg/dL — ABNORMAL HIGH (ref 0.0–1.2)
CO2: 25 mmol/L (ref 20–29)
Calcium: 9.4 mg/dL (ref 8.6–10.2)
Chloride: 100 mmol/L (ref 96–106)
Creatinine, Ser: 1.01 mg/dL (ref 0.76–1.27)
Globulin, Total: 2.7 g/dL (ref 1.5–4.5)
Glucose: 157 mg/dL — ABNORMAL HIGH (ref 70–99)
Potassium: 3.9 mmol/L (ref 3.5–5.2)
Sodium: 138 mmol/L (ref 134–144)
Total Protein: 7 g/dL (ref 6.0–8.5)
eGFR: 73 mL/min/{1.73_m2} (ref >59)

## 2024-02-06 LAB — LIPID PANEL
Chol/HDL Ratio: 3.9 ratio (ref 0.0–5.0)
Cholesterol, Total: 166 mg/dL (ref 100–199)
HDL: 43 mg/dL (ref >39)
LDL Chol Calc (NIH): 106 mg/dL — ABNORMAL HIGH (ref 0–99)
Triglycerides: 89 mg/dL (ref 0–149)
VLDL Cholesterol Cal: 17 mg/dL (ref 5–40)

## 2024-02-06 LAB — HEMOGLOBIN A1C
Est. average glucose Bld gHb Est-mCnc: 171 mg/dL
Hgb A1c MFr Bld: 7.6 % — ABNORMAL HIGH (ref 4.8–5.6)

## 2024-02-06 LAB — TSH: TSH: 1.06 u[IU]/mL (ref 0.450–4.500)

## 2024-02-07 ENCOUNTER — Other Ambulatory Visit: Payer: Self-pay | Admitting: Family Medicine

## 2024-02-07 MED ORDER — EMPAGLIFLOZIN 10 MG PO TABS: 10.0000 mg | ORAL_TABLET | Freq: Every day | ORAL | 2 refills | Status: DC

## 2024-02-08 ENCOUNTER — Ambulatory Visit: Payer: Self-pay | Admitting: Family Medicine

## 2024-02-08 DIAGNOSIS — I69392 Facial weakness following cerebral infarction: Secondary | ICD-10-CM

## 2024-02-08 NOTE — Telephone Encounter (Signed)
  Chief Complaint: lab results  Additional Notes: Read Dr Lonell Face note to patient: "Patient's A1c is still above goal.  Given his recent CVA I would strongly suggest he start a diabetic medication.   His cholesterol was also above goal which would be less than 70 for the LDL given his CVA.   Please call the patient and let him know that I am sending in a prescription for a medication for him to take once daily for diabetes.  It is called Jardiance.  We discussed this medication at his last visit.  He was also supposed to confirm if he was taking his statin which he picked up from the pharmacy after his hospital discharge.  Can you ask him if he can confirm if he is taking it or not?"  Informed patient that London Pepper was sent yesterday to his pharmacy. Patient verbalizes understanding that MD advising to start medication. Patient states he picked up "Rosuvastatin 20mg  take one tablet by mouth every day" but has not started taking it. He states he is concerned about being on so many medications. **Patient is asking about his referral to Neurology, patient states he has not heard back.  Copied from CRM [id number]. Topic: Clinical - Lab/Test Results >> Feb 08, 2024 11:45 AM [place] B wrote: Reason for CRM: pt called in for lab results Reason for Disposition  Caller requesting lab results  (Exception: Routine or non-urgent lab result.)  Answer Assessment - Initial Assessment Questions 1. REASON FOR CALL or QUESTION: "What is your reason for calling today?" or "How can I best help you?" or "What question do you have that I can help answer?"     Patient was calling in to receive lab results. Relayed Dr Lonell Face message from labs. "Patient's A1c is still above goal.  Given his recent CVA I would strongly suggest he start a diabetic medication.   His cholesterol was also above goal which would be less than 70 for the LDL given his CVA.   Please call the patient and let him know that I am sending in a  prescription for a medication for him to take once daily for diabetes.  It is called Jardiance.  We discussed this medication at his last visit.  He was also supposed to confirm if he was taking his statin which he picked up from the pharmacy after his hospital discharge.  Can you ask him if he can confirm if he is taking it or not?"  Protocols used: Information Only Call - No Triage-A-AH, PCP Call - No Triage-A-AH

## 2024-02-09 NOTE — Addendum Note (Signed)
 Addended by: [name] on: 02/09/2024 07:19 AM   Modules accepted: Orders

## 2024-02-09 NOTE — Telephone Encounter (Signed)
 LVM informing pt that the referral to neurology that he mentioned has been placed and that he should hear from someone to schedule.

## 2024-02-09 NOTE — Telephone Encounter (Signed)
 I do not remember discussing neurology referral with him and it is not in his note or AVS, but if he would like a referral I do not see a reason not to refer him.  I will put the order in.

## 2024-03-26 ENCOUNTER — Other Ambulatory Visit: Payer: Self-pay | Admitting: Family Medicine

## 2024-03-26 DIAGNOSIS — R7989 Other specified abnormal findings of blood chemistry: Secondary | ICD-10-CM

## 2024-04-06 ENCOUNTER — Ambulatory Visit: Payer: Self-pay

## 2024-04-06 ENCOUNTER — Telehealth: Payer: Self-pay | Admitting: Cardiology

## 2024-04-06 MED ORDER — APIXABAN 5 MG PO TABS: 5.0000 mg | ORAL_TABLET | Freq: Two times a day (BID) | ORAL | 2 refills | Status: DC

## 2024-04-06 NOTE — Telephone Encounter (Signed)
 Pt c/o medication issue:  1. Name of Medication: apixaban  (ELIQUIS ) 5 MG TABS tablet   2. How are you currently taking this medication (dosage and times per day)?   Take 1 tablet (5 mg total) by mouth 2 (two) times daily. Start Eliquis  after stopping aspirin     3. Are you having a reaction (difficulty breathing--STAT)? No  4. What is your medication issue? Pt would like a c/b as to whether he needs to continue taking medication before calling in a refill. Please advise

## 2024-04-06 NOTE — Telephone Encounter (Signed)
 Eilleen Grates, MD to Ronald Kelley Triage     04/06/24  4:36 PM I weighted the plusses and minuses in my note and his scans look like he has had embolic events in his brain and because of somebody finding atrial fib in the past it was suggested that he continue BID eliquis .  I could not argue with that.  I would suggest that he continue but he is correct.  I could not verify atrial fib.  We could stop it and put in an implanted monitor called a loop which is a very small procedure and that could monitor for fib.  We would be happy to talk to him about this and he could come see me or an APP to discuss.  Reviewed with patient, he will continue and call back if wants to come in to discuss loop with Dr Lavonne Prairie

## 2024-04-06 NOTE — Telephone Encounter (Signed)
 Called and spoke to pt. He has not taken his Eliquis  for the last 4 days. He states that he does not know if he has history of Afib and that he believes the stroke was caused by a bad fall that he had last November. He wants to know if he can come off of this medication, and he has a visit with Neurology on 04/21/24; he will ask about this there also, he states. Advised pt to continue medication unless provider discontinues it; he verbalized understanding and informs me that he needs a RX sent in to [facility]; will send in refill for Eliquis  for 90 days for now.

## 2024-04-06 NOTE — Telephone Encounter (Signed)
 Pt calls in about Eliquis . Pt states he has not taken it in 5 days. Pt states he is out of the medication. It was written with 3 refills and he refilled it 3 times.   Pt states he does not believe he has Afib and does not think he needs Eliquis . Pt states he would rather take an aspirin  if he can do that instead. RN advised pt it is wise he take a blood thinner to prevent the development of clots and future strokes. Pt states Eliquis  is expensive.  RN called the CAL. CAL stated pt should contact his cardiologist. This RN called the pt back and stated he needs to call his cardiologist. Pt verbalized understanding and stated he would call them right away.     Copied from CRM [id number]. Topic: Clinical - Pink Word Triage >> Apr 06, 2024 10:55 AM Crispin Dolphin wrote: Reason for Triage: Patient called states he was prescribed apixaban  (ELIQUIS ) 5 MG TABS tablet because of stroke because they said it was due to afib. Patient states he has not and any problems with afib. He has ran out of the medications wants to know if provider  could review his records and let him know if he to continue taking it or just take baby asprin or take nothing at all? Thank You Reason for Disposition  [1] Prescription refill request for ESSENTIAL medicine (i.e., likelihood of harm to patient if not taken) AND [2] triager unable to refill per department policy  Answer Assessment - Initial Assessment Questions 1. DRUG NAME: "What medicine do you need to have refilled?"     Eliquis   2. REFILLS REMAINING: "How many refills are remaining?" (Note: The label on the medicine or pill bottle will show how many refills are remaining. If there are no refills remaining, then a renewal may be needed.)     0 3. EXPIRATION DATE: "What is the expiration date?" (Note: The label states when the prescription will expire, and thus can no longer be refilled.)     11/05/24 4. PRESCRIBING HCP: "Who prescribed it?" Reason: If prescribed by specialist,  call should be referred to that group.     Gagan Lama 5. SYMPTOMS: "Do you have any symptoms?"      Pt states he fell out of bed and hit his head. Pt states his face was black the next day. Shortly thereafter they said he had a stroke. Pt states he does not have Afib. States he may have had it after a couple cups of coffee. Pt states he does not want to take Eliquis  if he doesn't want to and would rather take aspirin . States he does not want to get hooked on eliquis  and states it's expensive. Has not had it in 5 days. Out of refills.  Protocols used: Medication Refill and Renewal Call-A-AH

## 2024-04-21 ENCOUNTER — Ambulatory Visit: Payer: Self-pay | Admitting: Neurology

## 2024-04-21 ENCOUNTER — Encounter: Payer: Self-pay | Admitting: Neurology

## 2024-04-21 VITALS — BP 134/67 | HR 58 | Ht 65.5 in | Wt 151.5 lb

## 2024-04-21 DIAGNOSIS — I63413 Cerebral infarction due to embolism of bilateral middle cerebral arteries: Secondary | ICD-10-CM | POA: Diagnosis not present

## 2024-04-21 DIAGNOSIS — I480 Paroxysmal atrial fibrillation: Secondary | ICD-10-CM

## 2024-04-21 NOTE — Progress Notes (Signed)
 [facility]  PATIENT: Ronald Kelley DOB: 03/13/1941  REQUESTING CLINICIAN: Laneta Pintos, MD HISTORY FROM: Patient/Spouse and chart review  REASON FOR VISIT: Stroke follow up    HISTORICAL  CHIEF COMPLAINT:  Chief Complaint  Patient presents with   New Patient (Initial Visit)    Rm 13. NP/internal referral for hx Stroke: Pt reports issues with movement in fingers of right hand. Reports numbness in right side of face for about a month following stoke. Pt reports pain in left finger - not sure if stroke related.     HISTORY OF PRESENT ILLNESS:  This is a 83 year old gentleman past medical history of hypertension, hyperlipidemia, diabetes, BPH, paroxysmal atrial fibrillation who is presenting after being admitted in December 2024 for a stroke.  Patient reports initially having right hand weakness, difficulty opening his door.  At home wife noted that he did have right facial droop.  At that time he denied any slurred speech.  Patient was taken to the hospital found to have strokes in multiple vascular territory bilateral occipital and left cortical, left central sulcus with subarachnoid hemorrhage.  Stroke etiology was likely cardioembolic from atrial fibrillation.  He was initially put on DAPT but after a week switched to Eliquis .  Due to subarachnoid hemorrhage, he was put on Keppra  prophylaxis too but he has not refilled the medication, has not been taking it.  He denies any seizure or seizure like activity.  Today he tells me that his right facial numbness resolved.  He is still having issue with right hand numbness, and intrinsic hand muscle and difficulty with finger fine finger movement.  He did follow up with occupational therapy but was discharged after one visit. Denies any new fall, denies any new concerns, feel like he is almost back to his normal self.   Hospital course and summary  83 y.o. male with medical history significant of hypertension,  hyperlipidemia, diabetes, BPH, GERD, paroxysmal defibrillation, OSA, hyperparathyroidism presenting with right-sided weakness and slurred speech. CT head at 00:45 showed small acute subarachnoid hemorrhage in the left.  CT angiogram of the head at 05 100 showed grossly stable left middle gyrus subarachnoid hemorrhage, and noted some moderate stenosis of the left P2, 3, 4 arteries.  Neurosurgery recommended admission, Keppra  for seizure prophylaxis, systolic BP less than 150    Acute Ischemic Infarct:  bilateral -in left  periatrial white matter and right occipital lobe with hemorrhagic conversion in Left frontal convexity embolic infarct-hemorrhage-likely a hemorrhagic infarct Etiology: Atrial fibrillation not on anticoagulation CT head small focus of acute subarachnoid hemorrhage over the left convexity. Chronic small vessel ischemia and volume loss CTA head & neck no LVO MRI  Small acute infarcts within the left periatrial white matter and the right occipital lobe. Small area of subarachnoid hemorrhage within the left central sulcus with mild edema 2D Echo EF 60 to 65%.  LV with grade 1 diastolic dysfunction.  Left atrium moderately dilated Eeg This study is suggestive of cortical dysfunction arising from left temporal region likely secondary to underlying structural abnormality/ SAH. No seizures or epileptiform discharges were seen throughout the recording.  LDL 98 HgbA1c 7.1 VTE prophylaxis - SCD's  No antithrombotic prior to admission, now on aspirin  81 mg daily .  Consider transition to Eliquis  in about 5 to 7 days.  OTHER MEDICAL CONDITIONS: Atrial fibrillation, Diabetes, Hypertension, Hypothyroidism    REVIEW OF SYSTEMS: Full 14 system review of systems performed and negative with exception of: As noted in the  HPI   ALLERGIES: Allergies  Allergen Reactions   Hydrocodone-Acetaminophen  Other (See Comments)    REACTION: atrial fibrilation   Oxycodone Other (See Comments)    Atrial  fibrilation   Oxycodone-Acetaminophen  Other (See Comments)    REACTION: atrial fibrilation   ok Darvocet    HOME MEDICATIONS: Outpatient Medications Prior to Visit  Medication Sig Dispense Refill   apixaban  (ELIQUIS ) 5 MG TABS tablet Take 1 tablet (5 mg total) by mouth 2 (two) times daily. Start Eliquis  after stopping aspirin  60 tablet 2   Ascorbic Acid (VITAMIN C) 100 MG tablet Take 100 mg by mouth daily.     Cholecalciferol (VITAMIN D3) 125 MCG (5000 UT) TABS 5,000 IU OTC vitamin D3 daily. (Patient taking differently: See admin instructions. 5,000 IU OTC vitamin D3 combined with magnesium  tab daily) 90 tablet 3   levothyroxine  (SYNTHROID ) 50 MCG tablet TAKE 1 TABLET BY MOUTH EVERY DAY BEFORE BREAKFAST 90 tablet 0   timolol  (TIMOPTIC ) 0.5 % ophthalmic solution Place 1 drop into both eyes daily.     valsartan  (DIOVAN ) 40 MG tablet Take 1 tablet (40 mg total) by mouth daily. 90 tablet 3   Zinc Sulfate (ZINC 15 PO) Take 1 tablet by mouth daily.     Acetaminophen  (TYLENOL ) 325 MG CAPS Take 650 mg by mouth as needed. (Patient not taking: Reported on 04/21/2024)     empagliflozin  (JARDIANCE ) 10 MG TABS tablet Take 1 tablet (10 mg total) by mouth daily before breakfast. (Patient not taking: Reported on 04/21/2024) 30 tablet 2   No facility-administered medications prior to visit.    PAST MEDICAL HISTORY: Past Medical History:  Diagnosis Date   Atrial fib/flutter, transient (HCC)    Degenerative disk disease    Diabetes mellitus    a1c 6.5, 07/2008   GERD (gastroesophageal reflux disease)    History of inguinal hernia repair, bilateral 03/23/2019   History of kidney stones    History of vitamin D  deficiency 09/27/2016   Hyperthyroidism    remote h/o , no ablation   OSA on CPAP     PAST SURGICAL HISTORY: Past Surgical History:  Procedure Laterality Date   HERNIA REPAIR  2010   L5 acute HNP     s/p surgery 09/21/08-- still has occasional paretheisa of the left foot     PARATHYROIDECTOMY Right 01/22/2023   Procedure: RIGHT INFERIOR PARATHYROIDECTOMY;  Surgeon: Oralee Billow, MD;  Location: [facility];  Service: General;  Laterality: Right;   ROTATOR CUFF REPAIR      FAMILY HISTORY: Family History  Problem Relation Age of Onset   Hypertension Mother    Diabetes Brother    Alzheimer's disease Father 18   Coronary artery disease Neg Hx    Stroke Neg Hx    Colon cancer Neg Hx    Prostate cancer Neg Hx     SOCIAL HISTORY: Social History   Socioeconomic History   Marital status: Married    Spouse name: Not on file   Number of children: 3   Years of education: Not on file   Highest education level: Not on file  Occupational History   Occupation: Water quality scientist-- semi-retirement  Tobacco Use   Smoking status: Never    Passive exposure: Never   Smokeless tobacco: Never  Vaping Use   Vaping status: Never Used  Substance and Sexual Activity   Alcohol use: No   Drug use: No   Sexual activity: Not Currently  Other Topics Concern   Not on file  Social  History Narrative   Married to [employer]       Social Drivers of [employer]: Low Risk  (05/12/2023)   Overall Financial Resource Strain (CARDIA)    Difficulty of Paying Living Expenses: Not hard at all  Food Insecurity: No Food Insecurity (11/04/2023)   Hunger Vital Sign    Worried About Running Out of Food in the Last Year: Never true    Ran Out of Food in the Last Year: Never true  Transportation Needs: No Transportation Needs (11/04/2023)   PRAPARE - Administrator, Civil Service (Medical): No    Lack of Transportation (Non-Medical): No  Physical Activity: Insufficiently Active (05/12/2023)   Exercise Vital Sign    Days of Exercise per Week: 2 days    Minutes of Exercise per Session: 60 min  Stress: No Stress Concern Present (05/12/2023)   [employer] of Occupational Health - Occupational Stress Questionnaire    Feeling of Stress : Not at all  Social  Connections: Socially Integrated (05/12/2023)   Social Connection and Isolation Panel [NHANES]    Frequency of Communication with Friends and Family: More than three times a week    Frequency of Social Gatherings with Friends and Family: More than three times a week    Attends Religious Services: More than 4 times per year    Active Member of [employer] or Organizations: Yes    Attends Banker Meetings: More than 4 times per year    Marital Status: Married  Catering manager Violence: Not At Risk (11/04/2023)   Humiliation, Afraid, Rape, and Kick questionnaire    Fear of Current or Ex-Partner: No    Emotionally Abused: No    Physically Abused: No    Sexually Abused: No    PHYSICAL EXAM  GENERAL EXAM/CONSTITUTIONAL: Vitals:  Vitals:   04/21/24 0926  BP: 134/67  Pulse: (!) 58  Weight: 151 lb 8 oz (68.7 kg)  Height: 5' 5.5" (1.664 m)   Body mass index is 24.83 kg/m. Wt Readings from Last 3 Encounters:  04/21/24 151 lb 8 oz (68.7 kg)  01/22/24 156 lb 4 oz (70.9 kg)  12/11/23 153 lb 3.2 oz (69.5 kg)   Patient is in no distress; well developed, nourished and groomed; neck is supple  MUSCULOSKELETAL: Gait, strength, tone, movements noted in Neurologic exam below  NEUROLOGIC: MENTAL STATUS:      No data to display         awake, alert, oriented to person, place and time recent and remote memory intact normal attention and concentration language fluent, comprehension intact, naming intact fund of knowledge appropriate  CRANIAL NERVE:  2nd, 3rd, 4th, 6th - Visual fields full to confrontation, extraocular muscles intact, no nystagmus 5th - facial sensation symmetric 7th - facial strength symmetric 8th - hearing intact 9th - palate elevates symmetrically, uvula midline 11th - shoulder shrug symmetric 12th - tongue protrusion midline  MOTOR:  normal bulk and tone, full strength in the BUE, BLE except for a weak hand grip  SENSORY:  normal and symmetric to  light touch  COORDINATION:  finger-nose-finger, fine finger movements normal  GAIT/STATION:  normal   DIAGNOSTIC DATA (LABS, IMAGING, TESTING) - I reviewed patient records, labs, notes, testing and imaging myself where available.  Lab Results  Component Value Date   WBC 8.8 02/05/2024   HGB 14.9 02/05/2024   HCT 44.3 02/05/2024   MCV 90 02/05/2024   PLT 147 (L) 02/05/2024  Component Value Date/Time   NA 138 02/05/2024 1102   K 3.9 02/05/2024 1102   CL 100 02/05/2024 1102   CO2 25 02/05/2024 1102   GLUCOSE 157 (H) 02/05/2024 1102   GLUCOSE 128 (H) 11/05/2023 0439   BUN 18 02/05/2024 1102   CREATININE 1.01 02/05/2024 1102   CREATININE 1.09 10/29/2016 0750   CALCIUM  9.4 02/05/2024 1102   PROT 7.0 02/05/2024 1102   ALBUMIN 4.3 02/05/2024 1102   AST 16 02/05/2024 1102   ALT 16 02/05/2024 1102   ALKPHOS 87 02/05/2024 1102   BILITOT 1.3 (H) 02/05/2024 1102   GFRNONAA >60 11/05/2023 0439   GFRNONAA 66 10/29/2016 0750   GFRAA 77 12/31/2020 0924   GFRAA 76 10/29/2016 0750   Lab Results  Component Value Date   CHOL 166 02/05/2024   HDL 43 02/05/2024   LDLCALC 106 (H) 02/05/2024   TRIG 89 02/05/2024   CHOLHDL 3.9 02/05/2024   Lab Results  Component Value Date   HGBA1C 7.6 (H) 02/05/2024   Lab Results  Component Value Date   VITAMINB12 >2000 (H) 08/19/2022   Lab Results  Component Value Date   TSH 1.060 02/05/2024    MRI Brain 11/05/2023 1. Small acute infarcts within the left periatrial white matter and the right occipital lobe. 2. Small area of subarachnoid hemorrhage within the left central sulcus with mild edema of the adjacent brain. Allowing for different modality, unchanged  CTA Head and Neck 11/04/2023 1. Negative for age CTA of the anterior circulation. Left MCA branches are within normal limits. No large vessel occlusion or intracranial aneurysm identified. 2. Trace left middle frontal gyrus subarachnoid hemorrhage grossly stable. No evidence of a  vascular malformation. 3. Left PCA posterior circulation atherosclerosis with up to moderate stenosis of the left P2 and P3/P4 segment  Head CT 11/06/2023 Increased subarachnoid hemorrhage in the left frontal lobe. No evidence of intraventricular extension. No new sites of hemorrhage.   ASSESSMENT AND PLAN  83 y.o. year old male with atrial fibrillation, hypertension hyperlipidemia, diabetes mellitus, BPH who is presenting for follow-up after being admitted to the hospital in December for stroke.  Patient did have stroke in the bilateral occipital and left central sulcus.  Stroke etiology likely cardioembolic from atrial fibrillation.  He was started on Eliquis , plan will be for patient to continue with Eliquis .  He is back to his normal self except for a weak handgrip.  He did have outpatient occupational therapy but was discharged after 1 session.  Plan for now is for patient to continue doing the home exercises, continue to follow with PCP and to return as needed.  He does have a history of diabetes, not taking his Jardiance  but used an herbal supplement.  I did advise them to get an updated A1c at the next PCP visit.  They voiced understanding.   1. Cerebrovascular accident (CVA) due to bilateral embolism of middle cerebral arteries (HCC)   2. Paroxysmal atrial fibrillation [facility]      Patient Instructions  Continue current medication including Eliquis   Continue to follow up with PCP, please recheck your HgA1C  Return as needed   No orders of the defined types were placed in this encounter.   No orders of the defined types were placed in this encounter.   Return if symptoms worsen or fail to improve.  I have spent a total of 65 minutes dedicated to this patient today, preparing to see patient, performing a medically appropriate examination and evaluation, ordering tests  and/or medications and procedures, and counseling and educating the patient/family/caregiver; independently  interpreting result and communicating results to the family/patient/caregiver; and documenting clinical information in the electronic medical record.  Cassandra Cleveland, MD 04/21/2024, 2:13 PM  [facility] [address], [address] [place], [place] [address] [phone]

## 2024-04-21 NOTE — Patient Instructions (Signed)
 Continue current medication including Eliquis   Continue to follow up with PCP, please recheck your HgA1C  Return as needed

## 2024-05-09 ENCOUNTER — Ambulatory Visit (INDEPENDENT_AMBULATORY_CARE_PROVIDER_SITE_OTHER)

## 2024-05-09 DIAGNOSIS — Z0000 Encounter for general adult medical examination without abnormal findings: Secondary | ICD-10-CM

## 2024-05-09 NOTE — Patient Instructions (Signed)
 Mr. Brining , Thank you for taking time out of your busy schedule to complete your Annual Wellness Visit with me. I enjoyed our conversation and look forward to speaking with you again next year. I, as well as your care team,  appreciate your ongoing commitment to your health goals. Please review the following plan we discussed and let me know if I can assist you in the future. Your Game plan/ To Do List    Referrals: If you haven't heard from the office you've been referred to, please reach out to them at the phone provided.  N/a Follow up Visits: Next Medicare AWV with our clinical staff: 06/08/2025 at 2:20   Have you seen your provider in the last 6 months (3 months if uncontrolled diabetes)? Yes Next Office Visit with your provider: 05/26/2024 at 9:10  Clinician Recommendations:  Aim for 30 minutes of exercise or brisk walking, 6-8 glasses of water, and 5 servings of fruits and vegetables each day.       This is a list of the screening recommended for you and due dates:  Health Maintenance  Topic Date Due   Zoster (Shingles) Vaccine (1 of 2) Never done   Pneumonia Vaccine (2 of 2 - PCV) 06/23/2009   COVID-19 Vaccine (1 - 2024-25 season) Never done   Flu Shot  07/01/2024   Hemoglobin A1C  08/07/2024   Yearly kidney health urinalysis for diabetes  01/21/2025   Yearly kidney function blood test for diabetes  02/04/2025   Medicare Annual Wellness Visit  05/09/2025   DTaP/Tdap/Td vaccine (3 - Td or Tdap) 10/08/2033   HPV Vaccine  Aged Out   Meningitis B Vaccine  Aged Out   Complete foot exam   Discontinued   Eye exam for diabetics  Discontinued    Advanced directives: (Copy Requested) Please bring a copy of your health care power of attorney and living will to the office to be added to your chart at your convenience. You can mail to [facility] [address] [place], [place] [address] or email to [email or web address] Advance Care Planning is important because  it:  [x]  Makes sure you receive the medical care that is consistent with your values, goals, and preferences  [x]  It provides guidance to your family and loved ones and reduces their decisional burden about whether or not they are making the right decisions based on your wishes.  Follow the link provided in your after visit summary or read over the paperwork we have mailed to you to help you started getting your Advance Directives in place. If you need assistance in completing these, please reach out to us  so that we can help you!  See attachments for Preventive Care and Fall Prevention Tips.

## 2024-05-09 NOTE — Progress Notes (Signed)
 Subjective:   ALANZO LAMB is a 83 y.o. who presents for a Medicare Wellness preventive visit.  As a reminder, Annual Wellness Visits don't include a physical exam, and some assessments may be limited, especially if this visit is performed virtually. We may recommend an in-person follow-up visit with your provider if needed.  Visit Complete: Virtual I connected with  Duey Liller Mcgillis on 05/09/24 by a audio enabled telemedicine application and verified that I am speaking with the correct person using two identifiers.  Patient Location: Home  Provider Location: Office/Clinic  I discussed the limitations of evaluation and management by telemedicine. The patient expressed understanding and agreed to proceed.  Vital Signs: Because this visit was a virtual/telehealth visit, some criteria may be missing or patient reported. Any vitals not documented were not able to be obtained and vitals that have been documented are patient reported.  VideoError- Librarian, academic were attempted between this provider and patient, however failed, due to patient having technical difficulties OR patient did not have access to video capability.  We continued and completed visit with audio only.   Persons Participating in Visit: Patient.  AWV Questionnaire: No: Patient Medicare AWV questionnaire was not completed prior to this visit.  Cardiac Risk Factors include: advanced age (>61men, >29 women);diabetes mellitus;dyslipidemia;hypertension;male gender     Objective:     Today's Vitals   There is no height or weight on file to calculate BMI.     05/09/2024    2:35 PM 12/01/2023   11:05 AM 11/04/2023   12:31 AM 05/12/2023    3:13 PM 01/22/2023    6:36 AM 01/12/2023    8:17 AM 08/31/2019    9:45 AM  Advanced Directives  Does Patient Have a Medical Advance Directive? Yes No No Yes No No No  Type of Clinical research associate of  [place];Living will     Copy of Healthcare Power of Attorney in Chart? No - copy requested   No - copy requested     Would patient like information on creating a medical advance directive?  No - Patient declined No - Patient declined  No - Patient declined No - Patient declined     Current Medications (verified) Outpatient Encounter Medications as of 05/09/2024  Medication Sig   apixaban  (ELIQUIS ) 5 MG TABS tablet Take 1 tablet (5 mg total) by mouth 2 (two) times daily. Start Eliquis  after stopping aspirin    Berberine Chloride (BERBERINE HCI PO) Take 1 tablet by mouth 2 (two) times daily.   Cholecalciferol (VITAMIN D3) 125 MCG (5000 UT) TABS 5,000 IU OTC vitamin D3 daily. (Patient taking differently: See admin instructions. 5,000 IU OTC vitamin D3 combined with magnesium  tab daily)   levothyroxine  (SYNTHROID ) 50 MCG tablet TAKE 1 TABLET BY MOUTH EVERY DAY BEFORE BREAKFAST   timolol  (TIMOPTIC ) 0.5 % ophthalmic solution Place 1 drop into both eyes daily.   valsartan  (DIOVAN ) 40 MG tablet Take 1 tablet (40 mg total) by mouth daily.   Zinc Sulfate (ZINC 15 PO) Take 1 tablet by mouth daily.   Acetaminophen  (TYLENOL ) 325 MG CAPS Take 650 mg by mouth as needed. (Patient not taking: Reported on 05/09/2024)   Ascorbic Acid (VITAMIN C) 100 MG tablet Take 100 mg by mouth daily. (Patient not taking: Reported on 05/09/2024)   empagliflozin  (JARDIANCE ) 10 MG TABS tablet Take 1 tablet (10 mg total) by mouth daily before breakfast. (Patient not taking: Reported on 05/09/2024)  No facility-administered encounter medications on file as of 05/09/2024.    Allergies (verified) Hydrocodone-acetaminophen , Oxycodone, and Oxycodone-acetaminophen    History: Past Medical History:  Diagnosis Date   Atrial fib/flutter, transient (HCC)    Degenerative disk disease    Diabetes mellitus    a1c 6.5, 07/2008   GERD (gastroesophageal reflux disease)    History of inguinal hernia repair, bilateral 03/23/2019   History of  kidney stones    History of vitamin D  deficiency 09/27/2016   Hyperthyroidism    remote h/o , no ablation   OSA on CPAP    Past Surgical History:  Procedure Laterality Date   HERNIA REPAIR  2010   L5 acute HNP     s/p surgery 09/21/08-- still has occasional paretheisa of the left foot    PARATHYROIDECTOMY Right 01/22/2023   Procedure: RIGHT INFERIOR PARATHYROIDECTOMY;  Surgeon: Oralee Billow, MD;  Location: [facility];  Service: General;  Laterality: Right;   ROTATOR CUFF REPAIR     Family History  Problem Relation Age of Onset   Hypertension Mother    Diabetes Brother    Alzheimer's disease Father 82   Coronary artery disease Neg Hx    Stroke Neg Hx    Colon cancer Neg Hx    Prostate cancer Neg Hx    Social History   Socioeconomic History   Marital status: Married    Spouse name: Not on file   Number of children: 3   Years of education: Not on file   Highest education level: Not on file  Occupational History   Occupation: Water quality scientist-- semi-retirement  Tobacco Use   Smoking status: Never    Passive exposure: Never   Smokeless tobacco: Never  Vaping Use   Vaping status: Never Used  Substance and Sexual Activity   Alcohol use: No   Drug use: No   Sexual activity: Not Currently  Other Topics Concern   Not on file  Social History Narrative   Married to [employer]       Social Drivers of Health   Financial Resource Strain: Low Risk  (05/09/2024)   Overall Financial Resource Strain (CARDIA)    Difficulty of Paying Living Expenses: Not hard at all  Food Insecurity: No Food Insecurity (05/09/2024)   Hunger Vital Sign    Worried About Running Out of Food in the Last Year: Never true    Ran Out of Food in the Last Year: Never true  Transportation Needs: No Transportation Needs (05/09/2024)   PRAPARE - Administrator, Civil Service (Medical): No    Lack of Transportation (Non-Medical): No  Physical Activity: Insufficiently Active (05/09/2024)   Exercise Vital  Sign    Days of Exercise per Week: 2 days    Minutes of Exercise per Session: 60 min  Stress: No Stress Concern Present (05/09/2024)   [employer] of Occupational Health - Occupational Stress Questionnaire    Feeling of Stress : Not at all  Social Connections: Moderately Integrated (05/09/2024)   Social Connection and Isolation Panel [NHANES]    Frequency of Communication with Friends and Family: More than three times a week    Frequency of Social Gatherings with Friends and Family: More than three times a week    Attends Religious Services: More than 4 times per year    Active Member of [employer] or Organizations: No    Attends Banker Meetings: Never    Marital Status: Married    Tobacco Counseling Counseling given: Not Answered  Clinical Intake:  Pre-visit preparation completed: Yes  Pain : No/denies pain     Nutritional Risks: None Diabetes: Yes CBG done?: No Did pt. bring in CBG monitor from home?: No  Lab Results  Component Value Date   HGBA1C 7.6 (H) 02/05/2024   HGBA1C 7.1 (H) 11/05/2023   HGBA1C 7.6 (H) 09/28/2023     How often do you need to have someone help you when you read instructions, pamphlets, or other written materials from your doctor or pharmacy?: 1 - Never  Interpreter Needed?: No  Information entered by :: NAllen LPN   Activities of Daily Living     05/09/2024    2:22 PM 11/04/2023    2:29 PM  In your present state of health, do you have any difficulty performing the following activities:  Hearing? 1 1  Comment has hearing aids   Vision? 1 0  Comment small print   Difficulty concentrating or making decisions? 0 0  Walking or climbing stairs? 0   Dressing or bathing? 0   Doing errands, shopping? 0 0  Preparing Food and eating ? N   Using the Toilet? N   In the past six months, have you accidently leaked urine? N   Do you have problems with loss of bowel control? N   Managing your Medications? N   Managing your  Finances? N   Housekeeping or managing your Housekeeping? N     Patient Care Team: Laneta Pintos, MD as PCP - General (Family Medicine) Eilleen Grates, MD as PCP - Cardiology (Cardiology) Clance, Deena Farrier, MD as Referring Physician (Pulmonary Disease) Elna Haggis, MD as Consulting Physician (Neurosurgery) Janel Medford, MD (Inactive) as Attending Physician (Gastroenterology) Christina Coyer, MD as Consulting Physician (Urology) Gordy Lauber, MD as Consulting Physician (Endocrinology) Liliane Rei, MD as Consulting Physician (Orthopedic Surgery)  I have updated your Care Teams any recent Medical Services you may have received from other providers in the past year.     Assessment:    This is a routine wellness examination for Jaquille.  Hearing/Vision screen Hearing Screening - Comments:: Has hearing aids that are maintained Vision Screening - Comments:: Regular eye exams, [facility]   Goals Addressed             This Visit's Progress    Patient Stated       05/09/2024, denies goals       Depression Screen     05/09/2024    2:38 PM 01/22/2024    9:31 AM 11/10/2023    3:31 PM 09/28/2023    8:38 AM 05/27/2023    1:42 PM 05/12/2023    3:10 PM 03/26/2023   10:14 AM  PHQ 2/9 Scores  PHQ - 2 Score 0 0 1 0 0 0 0  PHQ- 9 Score  4 4 2 3  0 3    Fall Risk     05/09/2024    2:36 PM 05/27/2023    1:42 PM 05/12/2023    3:12 PM 08/18/2022    4:12 PM 01/17/2022    9:21 AM  Fall Risk   Falls in the past year? 0 0 0 1 0  Number falls in past yr: 0  0 0 0  Injury with Fall? 0  0 0 0  Risk for fall due to : Medication side effect No Fall Risks No Fall Risks No Fall Risks No Fall Risks  Follow up Falls evaluation completed;Falls prevention discussed Falls evaluation completed Falls prevention discussed Falls evaluation  completed Falls evaluation completed    MEDICARE RISK AT HOME:  Medicare Risk at Home Any stairs in or around the home?: Yes If so, are there any  without handrails?: No Home free of loose throw rugs in walkways, pet beds, electrical cords, etc?: Yes Adequate lighting in your home to reduce risk of falls?: Yes Life alert?: No Use of a cane, walker or w/c?: No Grab bars in the bathroom?: No Shower chair or bench in shower?: No Elevated toilet seat or a handicapped toilet?: No  TIMED UP AND GO:  Was the test performed?  No  Cognitive Function: 6CIT completed        05/09/2024    2:40 PM 05/12/2023    3:14 PM 01/03/2021    1:37 PM 03/23/2019    8:56 AM  6CIT Screen  What Year? 0 points 0 points 0 points 0 points  What month? 0 points 0 points 0 points 0 points  What time? 0 points 0 points 0 points 0 points  Count back from 20 0 points 0 points 2 points 0 points  Months in reverse 0 points 0 points 0 points 0 points  Repeat phrase 0 points 0 points 0 points 0 points  Total Score 0 points 0 points 2 points 0 points    Immunizations Immunization History  Administered Date(s) Administered   Pneumococcal Polysaccharide-23 06/23/2008   Td 06/23/2008   Tdap 10/09/2023    Screening Tests Health Maintenance  Topic Date Due   Zoster Vaccines- Shingrix (1 of 2) Never done   Pneumonia Vaccine 51+ Years old (2 of 2 - PCV) 06/23/2009   COVID-19 Vaccine (1 - 2024-25 season) Never done   INFLUENZA VACCINE  07/01/2024   HEMOGLOBIN A1C  08/07/2024   Diabetic kidney evaluation - Urine ACR  01/21/2025   Diabetic kidney evaluation - eGFR measurement  02/04/2025   Medicare Annual Wellness (AWV)  05/09/2025   DTaP/Tdap/Td (3 - Td or Tdap) 10/08/2033   HPV VACCINES  Aged Out   Meningococcal B Vaccine  Aged Out   FOOT EXAM  Discontinued   OPHTHALMOLOGY EXAM  Discontinued    Health Maintenance  Health Maintenance Due  Topic Date Due   Zoster Vaccines- Shingrix (1 of 2) Never done   Pneumonia Vaccine 8+ Years old (2 of 2 - PCV) 06/23/2009   COVID-19 Vaccine (1 - 2024-25 season) Never done   Health Maintenance Items  Addressed: Declines vaccines  Additional Screening:  Vision Screening: Recommended annual ophthalmology exams for early detection of glaucoma and other disorders of the eye. Would you like a referral to an eye doctor? No    Dental Screening: Recommended annual dental exams for proper oral hygiene  Community Resource Referral / Chronic Care Management: CRR required this visit?  No   CCM required this visit?  No   Plan:    I have personally reviewed and noted the following in the patient's chart:   Medical and social history Use of alcohol, tobacco or illicit drugs  Current medications and supplements including opioid prescriptions. Patient is not currently taking opioid prescriptions. Functional ability and status Nutritional status Physical activity Advanced directives List of other physicians Hospitalizations, surgeries, and ER visits in previous 12 months Vitals Screenings to include cognitive, depression, and falls Referrals and appointments  In addition, I have reviewed and discussed with patient certain preventive protocols, quality metrics, and best practice recommendations. A written personalized care plan for preventive services as well as general preventive health recommendations were  provided to patient.   Areatha Beecham, LPN   06/08/4695   After Visit Summary: (Pick Up) Due to this being a telephonic visit, with patients personalized plan was offered to patient and patient has requested to Pick up at office.  Notes: Nothing significant to report at this time.

## 2024-05-11 ENCOUNTER — Telehealth: Payer: Self-pay

## 2024-05-11 NOTE — Telephone Encounter (Signed)
 Copied from CRM [id number]. Topic: General - Other >> May 11, 2024 10:32 AM Felizardo Hotter wrote: Reason for CRM: Pt needs a call back regarding apixaban  (ELIQUIS ) 5 MG TABS tablet and  valsartan  (DIOVAN ) 40 MG tablet to stop taking due to dental surgery on 05/26/2024. Please call pt at [phone].

## 2024-05-13 NOTE — Telephone Encounter (Signed)
 Pt can hold the eliquis  for 48 hours prior to the procedure and can continue taking the valsartan .  It can be restarted once the bleeding from the procedure is under control.

## 2024-05-16 NOTE — Telephone Encounter (Signed)
 Called pt LVM to contact the office also will by sending a mychart message

## 2024-05-17 ENCOUNTER — Telehealth: Payer: Self-pay

## 2024-05-17 NOTE — Telephone Encounter (Signed)
 LVM for pt to call office to inform him of below.  Also told him that a message was also placed in his mychart for him about this.

## 2024-05-17 NOTE — Telephone Encounter (Signed)
 Copied from CRM [id number]. Topic: Clinical - Medical Advice >> May 17, 2024 11:11 AM Lizabeth Riggs wrote: Reason for CRM:  Ronald Kelley would like a nurse to call him today about this message: Pt can hold the eliquis  for 48 hours prior to the procedure and can continue taking the valsartan . It can be restarted once the bleeding from the procedure is under control.   I read him the message and he wants to know the reason he is taking Valsartan . He thought medication was a blood thinner. I did let him know that on the order for Valsartan  it has Associated Diagnoses: Primary hypertension. Jessi would still like a nurse to call and discuss. Please call him at [phone].  Thanks

## 2024-05-26 ENCOUNTER — Other Ambulatory Visit: Payer: Self-pay | Admitting: Family Medicine

## 2024-05-26 ENCOUNTER — Ambulatory Visit (INDEPENDENT_AMBULATORY_CARE_PROVIDER_SITE_OTHER): Payer: PPO | Admitting: Family Medicine

## 2024-05-26 ENCOUNTER — Encounter: Payer: Self-pay | Admitting: Family Medicine

## 2024-05-26 VITALS — BP 190/81 | HR 49 | Ht 65.5 in | Wt 151.0 lb

## 2024-05-26 DIAGNOSIS — M12812 Other specific arthropathies, not elsewhere classified, left shoulder: Secondary | ICD-10-CM | POA: Diagnosis not present

## 2024-05-26 DIAGNOSIS — E1169 Type 2 diabetes mellitus with other specified complication: Secondary | ICD-10-CM

## 2024-05-26 DIAGNOSIS — E785 Hyperlipidemia, unspecified: Secondary | ICD-10-CM | POA: Diagnosis not present

## 2024-05-26 DIAGNOSIS — I480 Paroxysmal atrial fibrillation: Secondary | ICD-10-CM | POA: Diagnosis not present

## 2024-05-26 DIAGNOSIS — E119 Type 2 diabetes mellitus without complications: Secondary | ICD-10-CM

## 2024-05-26 DIAGNOSIS — I10 Essential (primary) hypertension: Secondary | ICD-10-CM | POA: Diagnosis not present

## 2024-05-26 LAB — POCT GLYCOSYLATED HEMOGLOBIN (HGB A1C): HbA1c POC (<> result, manual entry): 6.6 % (ref 4.0–5.6)

## 2024-05-26 MED ORDER — LANCETS MISC. MISC: 1.0000 | Freq: Every day | 0 refills | Status: AC | PRN

## 2024-05-26 MED ORDER — BLOOD GLUCOSE TEST VI STRP: 1.0000 | ORAL_STRIP | Freq: Every day | 0 refills | Status: DC | PRN

## 2024-05-26 MED ORDER — BLOOD GLUCOSE MONITORING SUPPL DEVI: 1.0000 | Freq: Every day | 0 refills | Status: AC | PRN

## 2024-05-26 MED ORDER — LANCET DEVICE MISC: 1.0000 | Freq: Every day | 0 refills | Status: DC | PRN

## 2024-05-26 NOTE — Patient Instructions (Signed)
 It was nice to see you today,  We addressed the following topics today: -Your blood pressure is elevated today.  I would recommend taking the valsartan  every day. controlling your blood pressure will help prevent future strokes - For your shoulder pain I believe you have a rotator cuff injury.  I have provided you with some exercises to do at home.  You can also use over-the-counter topicals like IcyHot as needed. - Tylenol /acetaminophen  is also okay to take but avoid taking ibuprofen  Advil  Aleve or other NSAIDs due to your Eliquis  use.  Have a great day,  Rolan Slain, MD

## 2024-05-26 NOTE — Progress Notes (Signed)
 Established Patient Office Visit  Subjective   Patient ID: Ronald Kelley, male    DOB: [birth year]  Age: 83 y.o. MRN: [id number]  Chief Complaint  Patient presents with   Medical Management of Chronic Issues    HPI  Subjective - Fall from two steps approximately one month ago, fell through shrub onto cement - Right shoulder pain since fall, worse with reaching behind back and lying down at night - Pain interferes with sleep positioning most nights - Has not taken any pain medication per wife's advice due to other medications - History of right rotator cuff surgery when younger, left shoulder has known rotator cuff issue but not severe enough for surgery - Currently off Eliquis  for 48 hours prior to dental procedure today at 12pm for metal removal and partial denture fitting - Blood pressure not taken regularly, skips some days of Valsartan  - Reports usual BP around 128/68, recent readings higher at 160s and today 185/81 - Had one cup regular coffee before visit - No breakfast routine, occasionally eats toast with strawberry preserves - Reports finger weakness and dropping items related to previous stroke affecting specific fingers  Medications: Eliquis  5mg  twice daily (held for dental procedure), Synthroid , Valsartan  (taken irregularly), berberine twice daily for blood sugar.  PMH: diabetes, stroke with residual finger weakness, right rotator cuff surgery, left rotator cuff issue, hypertension. PSH: right rotator cuff surgery, upcoming dental procedure. 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   The ASCVD Risk score (Arnett DK, et al., 2019) failed to calculate for the following reasons:   The 2019 ASCVD risk score is only valid for ages 52 to 19   Risk score cannot be calculated because  patient has a medical history suggesting prior/existing ASCVD  Health Maintenance Due  Topic Date Due   Zoster Vaccines- Shingrix (1 of 2) Never done   Pneumococcal Vaccine: 50+ Years (2 of 2 - PCV) 06/23/2009   COVID-19 Vaccine (1 - 2024-25 season) Never done      Objective:     BP (!) 190/81   Pulse (!) 49   Ht 5' 5.5 (1.664 m)   Wt 151 lb (68.5 kg)   SpO2 98%   BMI 24.75 kg/m    Physical Exam General: well-appearing.  MSK: right shoulder pain with internal rotation and reaching behind back, able to resist external rotation and abduction, pain with palpation posteriorly, maintains full range of motion.   Results for orders placed or performed in visit on 05/26/24  POCT glycosylated hemoglobin (Hb A1C)  Result Value Ref Range   Hemoglobin A1C     HbA1c POC (<> result, manual entry) 6.6 4.0 - 5.6 %   HbA1c, POC (prediabetic range)     HbA1c, POC (controlled diabetic range)          Assessment & Plan:   Diet-controlled diabetes mellitus (HCC) Assessment & Plan: A1C improved from 7.6 to 6.6, well-controlled on current regimen. - Continue berberine twice daily - Glucometer and supplies ordered for home monitoring - Follow up in 4 months   Primary hypertension Assessment & Plan:  Elevated today at 185/81, patient reports irregular compliance with Valsartan . Misses doses more often than he takes it.   - Counseled on daily Valsartan  compliance.  Recommended taking daily.  - Follow up in 4 months   Rotator  cuff arthropathy, left Assessment & Plan: Right shoulder pain following fall one month ago. Reports fall from two steps onto cement while on anticoagulation. Physical exam consistent with rotator cuff tendinitis. Doubt complete tear given maintained strength and range of motion. Counseled on importance of notifying medical providers immediately after head trauma while on anticoagulation due to increased bleeding risk. - Tylenol  for pain management, avoid NSAIDs  and aspirin  due to Eliquis  - Topical menthol-containing creams such as Icy Hot 2-3 times daily - Home exercise program with gentle stretching and resistance band exercises - Consider orthopedic referral if no improvement   Paroxysmal A-fib (HCC) Assessment & Plan: Eliquis  Currently held for dental procedure, will resume post-procedure once bleeding stops. - Resume Eliquis  5mg  twice daily once dental bleeding has stopped and clotted   Hyperlipidemia associated with type 2 diabetes mellitus (HCC) -     POCT glycosylated hemoglobin (Hb A1C)  Diabetes mellitus without complication (HCC) -     POCT glycosylated hemoglobin (Hb A1C)  Other orders -     Blood Glucose Monitoring Suppl; 1 each by Does not apply route daily as needed. May substitute to any manufacturer covered by patient's insurance.  Dispense: 1 each; Refill: 0 -     Blood Glucose Test; 1 each by In Vitro route daily as needed. May substitute to any manufacturer covered by patient's insurance.  Dispense: 100 strip; Refill: 0 -     Lancet Device; 1 each by Does not apply route daily as needed. May substitute to any manufacturer covered by patient's insurance.  Dispense: 1 each; Refill: 0 -     Lancets Misc.; 1 each by Does not apply route daily as needed. May substitute to any manufacturer covered by patient's insurance.  Dispense: 100 each; Refill: 0     Return in about 4 months (around 09/25/2024) for DM, HTN.    Toribio MARLA Slain, MD

## 2024-05-26 NOTE — Assessment & Plan Note (Signed)
 Right shoulder pain following fall one month ago. Reports fall from two steps onto cement while on anticoagulation. Physical exam consistent with rotator cuff tendinitis. Doubt complete tear given maintained strength and range of motion. Counseled on importance of notifying medical providers immediately after head trauma while on anticoagulation due to increased bleeding risk. - Tylenol  for pain management, avoid NSAIDs and aspirin  due to Eliquis  - Topical menthol-containing creams such as Icy Hot 2-3 times daily - Home exercise program with gentle stretching and resistance band exercises - Consider orthopedic referral if no improvement

## 2024-05-26 NOTE — Assessment & Plan Note (Signed)
 Eliquis  Currently held for dental procedure, will resume post-procedure once bleeding stops. - Resume Eliquis  5mg  twice daily once dental bleeding has stopped and clotted

## 2024-05-26 NOTE — Assessment & Plan Note (Signed)
 A1C improved from 7.6 to 6.6, well-controlled on current regimen. - Continue berberine twice daily - Glucometer and supplies ordered for home monitoring - Follow up in 4 months

## 2024-05-26 NOTE — Assessment & Plan Note (Signed)
 Elevated today at 185/81, patient reports irregular compliance with Valsartan . Misses doses more often than he takes it.   - Counseled on daily Valsartan  compliance.  Recommended taking daily.  - Follow up in 4 months

## 2024-06-01 ENCOUNTER — Other Ambulatory Visit: Payer: Self-pay | Admitting: Family Medicine

## 2024-06-01 MED ORDER — GLUCOSE BLOOD VI STRP: ORAL_STRIP | 12 refills | Status: AC

## 2024-06-07 DIAGNOSIS — M47812 Spondylosis without myelopathy or radiculopathy, cervical region: Secondary | ICD-10-CM | POA: Diagnosis not present

## 2024-06-07 DIAGNOSIS — M12812 Other specific arthropathies, not elsewhere classified, left shoulder: Secondary | ICD-10-CM | POA: Diagnosis not present

## 2024-06-07 DIAGNOSIS — M75102 Unspecified rotator cuff tear or rupture of left shoulder, not specified as traumatic: Secondary | ICD-10-CM | POA: Diagnosis not present

## 2024-06-15 ENCOUNTER — Other Ambulatory Visit: Payer: Self-pay | Admitting: Family Medicine

## 2024-06-15 DIAGNOSIS — R7989 Other specified abnormal findings of blood chemistry: Secondary | ICD-10-CM

## 2024-06-15 MED ORDER — LEVOTHYROXINE SODIUM 50 MCG PO TABS: ORAL_TABLET | ORAL | 0 refills | Status: DC

## 2024-06-15 NOTE — Telephone Encounter (Unsigned)
 Copied from CRM [id number]. Topic: Clinical - Medication Refill >> Jun 15, 2024  9:31 AM Tiffany S wrote: Medication: levothyroxine  (SYNTHROID ) 50 MCG tablet [[id number]]  Has the patient contacted their pharmacy? Yes (Agent: If no, request that the patient contact the pharmacy for the refill. If patient does not wish to contact the pharmacy document the reason why and proceed with request.) (Agent: If yes, when and what did the pharmacy advise?)  This is the patient's preferred pharmacy:  [id number] [address], [place] - [address] [place] [address] Phone: [phone] Fax: [phone]   Is this the correct pharmacy for this prescription? Yes If no, delete pharmacy and type the correct one.   Has the prescription been filled recently? Yes  Is the patient out of the medication? Yes  Has the patient been seen for an appointment in the last year OR does the patient have an upcoming appointment? Yes  Can we respond through MyChart? Yes  Agent: Please be advised that Rx refills may take up to 3 business days. We ask that you follow-up with your pharmacy.

## 2024-06-15 NOTE — Telephone Encounter (Signed)
 Copied from CRM [id number]. Topic: Clinical - Medication Refill >> Jun 15, 2024  9:31 AM Tiffany S wrote: Medication: levothyroxine  (SYNTHROID ) 50 MCG tablet [[id number]]  Has the patient contacted their pharmacy? Yes (Agent: If no, request that the patient contact the pharmacy for the refill. If patient does not wish to contact the pharmacy document the reason why and proceed with request.) (Agent: If yes, when and what did the pharmacy advise?)  This is the patient's preferred pharmacy:  [id number] [place], [place] - [address] [place] [address] Phone: [phone] Fax: [phone]   Is this the correct pharmacy for this prescription? Yes If no, delete pharmacy and type the correct one.   Has the prescription been filled recently? Yes  Is the patient out of the medication? Yes  Has the patient been seen for an appointment in the last year OR does the patient have an upcoming appointment? Yes  Can we respond through MyChart? Yes  Agent: Please be advised that Rx refills may take up to 3 business days. We ask that you follow-up with your pharmacy.

## 2024-07-01 ENCOUNTER — Other Ambulatory Visit: Payer: Self-pay | Admitting: Cardiology

## 2024-07-01 NOTE — Telephone Encounter (Signed)
 Prescription refill request for Eliquis  received. Indication:afib Last office visit:1/25 Scr:1.01  3/25 Age: 83 Weight:68.5  kg  Prescription refilled

## 2024-07-12 DIAGNOSIS — H401131 Primary open-angle glaucoma, bilateral, mild stage: Secondary | ICD-10-CM | POA: Diagnosis not present

## 2024-08-02 DIAGNOSIS — E118 Type 2 diabetes mellitus with unspecified complications: Secondary | ICD-10-CM | POA: Insufficient documentation

## 2024-08-02 DIAGNOSIS — E785 Hyperlipidemia, unspecified: Secondary | ICD-10-CM | POA: Insufficient documentation

## 2024-08-02 NOTE — Progress Notes (Unsigned)
 Cardiology Office Note:   Date:  08/03/2024  ID:  Ronald Kelley, DOB 15-Dec-1940, MRN [id number] PCP: Chandra Toribio POUR, MD  [facility] Providers Cardiologist:  Lynwood Schilling, MD {  History of Present Illness:   Ronald Kelley is a 83 y.o. male who presents for follow-up of palpitations.  He was noted to have atrial fibrillation in 2010.    He had a subarachnoid hemorrhage in Nov 2024 after falling out of bed.  He had possible embolic stroke in Dec 2024.  He was only taking his Eliquis  once daily at that point.    He returns today.  He stopped taking his valsartan  because he has been fatigued and his blood pressure is low.  He stopped taking his Jardiance .  He is very interested in not having another stroke.  He is also worried about being on the blood thinner.  The documentation of his atrial fibrillation was in the distant past and I could not find evidence of it but it was thought by neurology that he was having an embolic etiology likely related to atrial fibrillation.  He had had a subarachnoid hemorrhage after falling out of bed but neurosurgery recommended conservative management.  He was felt to be at low risk for bleeding and was started on Eliquis .  He comes back to discuss this.  He has been fatigued.  He has a little bit of anhedonia.  He denies any chest pressure, neck or arm discomfort.  He is not having any palpitations, presyncope or syncope.  He has had no further falls.  Has had no new shortness of breath, PND or orthopnea.  Has had no weight gain or edema.  He does have a little paralysis of a couple of his fingers he says related to his previous strokes.   ROS: As stated in the HPI and negative for all other systems.  Studies Reviewed:    EKG:     NA  Risk Assessment/Calculations:    CHA2DS2-VASc Score = 5   This indicates a 7.2% annual risk of stroke. The patient's score is based upon: CHF History: 0 HTN History: 1 Diabetes History: 0 Stroke History:  2 Vascular Disease History: 0 Age Score: 2 Gender Score: 0         Physical Exam:   VS:  BP 106/62 (BP Location: Right Arm, Patient Position: Sitting, Cuff Size: Normal)   Pulse 64   Ht 5' 5 (1.651 m)   Wt 147 lb (66.7 kg)   SpO2 96%   BMI 24.46 kg/m    Wt Readings from Last 3 Encounters:  08/03/24 147 lb (66.7 kg)  05/26/24 151 lb (68.5 kg)  04/21/24 151 lb 8 oz (68.7 kg)     GEN: Well nourished, well developed in no acute distress NECK: No JVD; No carotid bruits CARDIAC: RRR, no murmurs, rubs, gallops RESPIRATORY:  Clear to auscultation without rales, wheezing or rhonchi  ABDOMEN: Soft, non-tender, non-distended EXTREMITIES:  No edema; No deformity   ASSESSMENT AND PLAN:   Acute Ischemic Cerebral infarct:   This is a difficult decision.  It is thought that his infarcts have been related to embolic phenomenon likely A-fib given his past history.  For now after careful discussion and agreement we will continue his Eliquis  but I am gena put a 4-week monitor on him.  If this demonstrates atrial fibrillation that confirms the treatment.  If not we might consider a loop implant as he would be interested in coming off the  Eliquis  if we cannot absolutely confirm fibrillation.   Atrial fibrillation:   As above.  His risk score is as calculated as above.  He has participated in decision making.   Hypertension: His blood pressure is low.  He took himself off of valsartan  and Jardiance  and we will remove these from his list.    Diabetes type II UnControlled: His A1c is 7.6.  He took himself off of Jardiance  and I will defer to his primary provider.       Follow up in 2 months.   Signed, Lynwood Schilling, MD

## 2024-08-03 ENCOUNTER — Ambulatory Visit: Attending: Cardiovascular Disease | Admitting: Cardiology

## 2024-08-03 ENCOUNTER — Encounter: Payer: Self-pay | Admitting: Cardiology

## 2024-08-03 VITALS — BP 106/62 | HR 64 | Ht 65.0 in | Wt 147.0 lb

## 2024-08-03 DIAGNOSIS — E118 Type 2 diabetes mellitus with unspecified complications: Secondary | ICD-10-CM | POA: Diagnosis not present

## 2024-08-03 DIAGNOSIS — I480 Paroxysmal atrial fibrillation: Secondary | ICD-10-CM | POA: Diagnosis not present

## 2024-08-03 DIAGNOSIS — E785 Hyperlipidemia, unspecified: Secondary | ICD-10-CM | POA: Diagnosis not present

## 2024-08-03 DIAGNOSIS — I10 Essential (primary) hypertension: Secondary | ICD-10-CM | POA: Diagnosis not present

## 2024-08-03 NOTE — Patient Instructions (Signed)
 Medication Instructions:  Stop Valsartan  Stop Jardiance  *If you need a refill on your cardiac medications before your next appointment, please call your pharmacy*  Lab Work: NONE If you have labs (blood work) drawn today and your tests are completely normal, you will receive your results only by: MyChart Message (if you have MyChart) OR A paper copy in the mail If you have any lab test that is abnormal or we need to change your treatment, we will call you to review the results.  Testing/Procedures: 4 week heart monitor Your physician has recommended that you wear an event monitor. Event monitors are medical devices that record the heart's electrical activity. Doctors most often us  these monitors to diagnose arrhythmias. Arrhythmias are problems with the speed or rhythm of the heartbeat. The monitor is a small, portable device. You can wear one while you do your normal daily activities. This is usually used to diagnose what is causing palpitations/syncope (passing out).   Follow-Up: At [facility], you and your health needs are our priority.  As part of our continuing mission to provide you with exceptional heart care, our providers are all part of one team.  This team includes your primary Cardiologist (physician) and Advanced Practice Providers or APPs (Physician Assistants and Nurse Practitioners) who all work together to provide you with the care you need, when you need it.  Your next appointment:   2 month(s)  Provider:   Lynwood Schilling, MD    We recommend signing up for the patient portal called MyChart.  Sign up information is provided on this After Visit Summary.  MyChart is used to connect with patients for Virtual Visits (Telemedicine).  Patients are able to view lab/test results, encounter notes, upcoming appointments, etc.  Non-urgent messages can be sent to your provider as well.   To learn more about what you can do with MyChart, go to [email or web address].

## 2024-09-13 ENCOUNTER — Ambulatory Visit: Attending: Cardiology

## 2024-09-13 DIAGNOSIS — I480 Paroxysmal atrial fibrillation: Secondary | ICD-10-CM

## 2024-09-16 NOTE — Progress Notes (Signed)
   09/16/2024  Patient ID: Ronald Kelley Copa, male   DOB: 12/25/1940, 83 y.o.   MRN: [id number]  Pharmacy Quality Measure Review  This patient is appearing on a report for being at risk of failing the adherence measure for hypertension (ACEi/ARB) medications this calendar year.   Medication: valsartan  Last fill date: 05/03/2024  for 30 day supply  Medication already stopped on med list. Stopped himself d/t low bP.  Lang Sieve, PharmD, BCGP Clinical Pharmacist  [phone]

## 2024-09-18 ENCOUNTER — Ambulatory Visit: Payer: Self-pay | Admitting: Cardiology

## 2024-09-18 DIAGNOSIS — I480 Paroxysmal atrial fibrillation: Secondary | ICD-10-CM

## 2024-09-19 NOTE — Telephone Encounter (Signed)
 Pt returning call

## 2024-09-28 ENCOUNTER — Encounter: Payer: Self-pay | Admitting: Family Medicine

## 2024-09-28 ENCOUNTER — Ambulatory Visit: Admitting: Family Medicine

## 2024-09-28 VITALS — BP 118/63 | HR 65 | Ht 65.0 in | Wt 150.0 lb

## 2024-09-28 DIAGNOSIS — E119 Type 2 diabetes mellitus without complications: Secondary | ICD-10-CM

## 2024-09-28 DIAGNOSIS — E1169 Type 2 diabetes mellitus with other specified complication: Secondary | ICD-10-CM | POA: Diagnosis not present

## 2024-09-28 DIAGNOSIS — I480 Paroxysmal atrial fibrillation: Secondary | ICD-10-CM

## 2024-09-28 DIAGNOSIS — L723 Sebaceous cyst: Secondary | ICD-10-CM | POA: Diagnosis not present

## 2024-09-28 DIAGNOSIS — E118 Type 2 diabetes mellitus with unspecified complications: Secondary | ICD-10-CM

## 2024-09-28 DIAGNOSIS — E785 Hyperlipidemia, unspecified: Secondary | ICD-10-CM

## 2024-09-28 LAB — POCT GLYCOSYLATED HEMOGLOBIN (HGB A1C): Hemoglobin A1C: 6.3 % — AB (ref 4.0–5.6)

## 2024-09-28 NOTE — Progress Notes (Unsigned)
 Established Patient Office Visit  Subjective   Patient ID: Ronald Kelley, male    DOB: 1941/04/03  Age: 83 y.o. MRN: [id number]  Chief Complaint  Patient presents with   Hypertension   Diabetes    HPI  Subjective - New concern for a lesion on the penis, present for at least two months. Initially appeared as a small bump, possibly a blackhead. Attempted to express it, which resulted in a scab. Has been applying Neosporin and covering with a bandage. Reports pain on direct palpation but is otherwise asymptomatic. - Discussed history of atrial fibrillation. States he does not agree with the diagnosis and wants to stop Eliquis . Believes his stroke last year was caused by a fall where he hit his head, not from AFib. He reports only one known episode of AFib which he attributes to drinking too much coffee. Reports scrotal itching and sensitivity, which he attributes to Eliquis . Reports he is currently taking Eliquis  twice daily as prescribed. - Reports residual effects from stroke, including constant pain in one finger and weakness, causing him to drop things. Also reports difficulty closing fingers on both hands, which he also attributes to the stroke, though his wife suggests it is arthritis. He is concerned about having another stroke.  Medications Taking Eliquis  5mg  BID, levothyroxine  (dose not specified), vitamin D3, and berberine. He has stopped taking his previously prescribed valsartan /losartan, Jardiance , and Crestor .  PMH, PSH, FH, Social Hx PMHx: Atrial fibrillation (diagnosed in 2018), CVA (last year), hypertension, hyperlipidemia, type 2 diabetes mellitus (A1c 6.3 on berberine). PSH: None mentioned. FH: Father died at 68 with heart problems and prostate cancer. Mother died at 22. Father's sister lived to 74. Father never had a stroke. Social Hx: Drinks one cup of regular coffee and one cup of decaf coffee in the morning.  ROS Pertinent positives: Penile lesion with pain on  palpation, scrotal itching, chronic finger pain and weakness post-CVA, difficulty with hand closure. Pertinent negatives: No pain from penile lesion if not touched.     The ASCVD Risk score (Arnett DK, et al., 2019) failed to calculate for the following reasons:   The 2019 ASCVD risk score is only valid for ages 56 to 9   Risk score cannot be calculated because patient has a medical history suggesting prior/existing ASCVD  Health Maintenance Due  Topic Date Due   Zoster Vaccines- Shingrix (1 of 2) Never done   Pneumococcal Vaccine: 50+ Years (2 of 2 - PCV) 06/23/2009   COVID-19 Vaccine (1 - 2025-26 season) Never done      Objective:     BP 118/63   Pulse 65   Ht 5' 5 (1.651 m)   Wt 150 lb (68 kg)   SpO2 97%   BMI 24.96 kg/m  {Vitals History (Optional):23777}  Physical Exam Gen: alert, oriented CV: rrr, no murmur Pulm: no respiratory distress SKIN: raised Lesion on the penile shaft, mild erythema, tender to palpation, with a central opening. No purulent drainage noted. A second, smaller, non-inflamed dark lesion, consistent with a open comedone, is noted nearby. Psych: pleasant affect   Results for orders placed or performed in visit on 09/28/24  POCT HgB A1C  Result Value Ref Range   Hemoglobin A1C 6.3 (A) 4.0 - 5.6 %   HbA1c POC (<> result, manual entry)     HbA1c, POC (prediabetic range)     HbA1c, POC (controlled diabetic range)          Assessment & Plan:  Type 2 diabetes mellitus with complication, without long-term current use of insulin  (HCC) -     POCT glycosylated hemoglobin (Hb A1C)  Diet-controlled diabetes mellitus (HCC) Assessment & Plan: A1c 6.3%.  continue diet control and berberine.    Hyperlipidemia associated with type 2 diabetes mellitus (HCC) Assessment & Plan: He is not taking a statin.  Has no interest in taking a statin.  Counseled on recommendation for statin despite his overall cholesterol levels being normal/minimally  elevated.    Paroxysmal A-fib [facility]) Assessment & Plan: - History of paroxysmal atrial fibrillation, documented on EKG in 2018. Recent 30-day monitor was negative. Had a CVA last year, etiology unclear but AFib is a suspected cause. He wishes to discontinue Eliquis , believing the stroke was from a fall. - Counseled on the risks and benefits of Eliquis , including the increased risk of stroke if the medication is stopped. Explained that aspirin  is not an equivalent substitute for preventing strokes from AFib. - Discussed the option of an implantable loop recorder for long-term monitoring to definitively detect intermittent AFib, as recommended by cardiology. - He remains ambivalent about continuing Eliquis  and undergoing loop recorder implantation. Final decision rests with him. Recommended continuing Eliquis  for now out of caution.   Inflamed epidermoid cyst of skin Assessment & Plan: - Inflamed lesion on the penile shaft, likely secondary to mechanical irritation (squeezing). - Stop using Neosporin. - Apply plain Vaseline to the area. - May cover with gauze and skin tape if needed. - Return for evaluation if it becomes larger, more red, more painful, or develops purulent drainage.      Return in about 6 months (around 03/29/2025) for physical.    Toribio MARLA Slain, MD

## 2024-09-28 NOTE — Assessment & Plan Note (Signed)
-   History of paroxysmal atrial fibrillation, documented on EKG in 2018. Recent 30-day monitor was negative. Had a CVA last year, etiology unclear but AFib is a suspected cause. He wishes to discontinue Eliquis , believing the stroke was from a fall. - Counseled on the risks and benefits of Eliquis , including the increased risk of stroke if the medication is stopped. Explained that aspirin  is not an equivalent substitute for preventing strokes from AFib. - Discussed the option of an implantable loop recorder for long-term monitoring to definitively detect intermittent AFib, as recommended by cardiology. - He remains ambivalent about continuing Eliquis  and undergoing loop recorder implantation. Final decision rests with him. Recommended continuing Eliquis  for now out of caution.

## 2024-09-28 NOTE — Assessment & Plan Note (Signed)
 He is not taking a statin.  Has no interest in taking a statin.  Counseled on recommendation for statin despite his overall cholesterol levels being normal/minimally elevated.

## 2024-09-28 NOTE — Assessment & Plan Note (Signed)
 A1c 6.3%.  continue diet control and berberine.

## 2024-09-28 NOTE — Patient Instructions (Signed)
 It was nice to see you today,  We addressed the following topics today:  - For the spot on your penis, stop using Neosporin. - I would like you to apply plain Vaseline to the area instead. You can cover it with a piece of gauze if it is draining. - Please let us  know if the area gets more red, larger, more painful, or starts draining pus. - Do not squeeze or mash the lesion again. - I would recommend staying on the eliquis  due to your history of afib in the past, until you can definitively rule it out with the loop recorder.    Have a great day,  Rolan Slain, MD

## 2024-09-28 NOTE — Assessment & Plan Note (Signed)
-   Inflamed lesion on the penile shaft, likely secondary to mechanical irritation (squeezing). - Stop using Neosporin. - Apply plain Vaseline to the area. - May cover with gauze and skin tape if needed. - Return for evaluation if it becomes larger, more red, more painful, or develops purulent drainage.

## 2024-10-03 NOTE — Progress Notes (Unsigned)
  Cardiology Office Note:   Date:  10/06/2024  ID:  Ronald Kelley, DOB 1941/04/17, MRN [id number] PCP: Chandra Toribio POUR, MD  [facility] Providers Cardiologist:  Lynwood Schilling, MD {  History of Present Illness:   Ronald Kelley is a 83 y.o. male who presents for follow-up of palpitations.  He was noted to have atrial fibrillation in 2010.    He had a subarachnoid hemorrhage in Nov 2024 after falling out of bed.  He had possible embolic stroke in Dec 2024.  He was only taking his Eliquis  once daily at that point.  He wants to come off Eliquis .  Four week monitor demonstrated no atrial fib.  I have referred him to EP for loop implant.    They called him yesterday but he was not able to get that call.  He came today to follow-up.  He feels okay.  He is walking around the park twice a week. The patient denies any new symptoms such as chest discomfort, neck or arm discomfort. There has been no new shortness of breath, PND or orthopnea. There have been no reported palpitations, presyncope or syncope.  He has not had any of the lightheadedness that he had previously.  He has had no falls.  He has had no palpitations, presyncope or syncope.  He denies any chest pressure, neck or arm discomfort.  He does have some residual paralysis in a couple of his fingers related to his previous strokes.  ROS:  As stated in the HPI and negative for all other systems.  Studies Reviewed:    EKG:     NA  Risk Assessment/Calculations:    CHA2DS2-VASc Score = 5   This indicates a 7.2% annual risk of stroke. The patient's score is based upon:   Physical Exam:   VS:  BP 138/77 (BP Location: Left Arm, Patient Position: Sitting)   Pulse (!) 59   Ht 5' 5 (1.651 m)   Wt 150 lb 3.2 oz (68.1 kg)   SpO2 97%   BMI 24.99 kg/m    Wt Readings from Last 3 Encounters:  10/06/24 150 lb 3.2 oz (68.1 kg)  09/28/24 150 lb (68 kg)  08/03/24 147 lb (66.7 kg)     GEN: Well nourished, well developed in no acute  distress NECK: No JVD; No carotid bruits CARDIAC: RRR, no murmurs, rubs, gallops RESPIRATORY:  Clear to auscultation without rales, wheezing or rhonchi  ABDOMEN: Soft, non-tender, non-distended EXTREMITIES:  No edema; No deformity   ASSESSMENT AND PLAN:   Acute Ischemic Cerebral infarct:   We had long discussions about this.  He does not want to take the Eliquis  unless there is a clear indication.  His monitor did not demonstrate fibrillation.  I am going to have him referred for a loop implant and in the meantime he will stop his Eliquis  and start baby aspirin .  If we find fibrillation going forward he would agree to restarting.    Atrial fibrillation:   As above.  Hypertension: His blood pressure is at target.  Previously had been low and he took himself off valsartan  and Jardiance .  Continue current therapy.   Diabetes type II UnControlled: His A1c is 6.3.  No change in therapy.     Follow up with me in 1 year or sooner if needed.  Signed, Lynwood Schilling, MD

## 2024-10-06 ENCOUNTER — Encounter: Payer: Self-pay | Admitting: Cardiology

## 2024-10-06 ENCOUNTER — Ambulatory Visit: Attending: Cardiology | Admitting: Cardiology

## 2024-10-06 VITALS — BP 138/77 | HR 59 | Ht 65.0 in | Wt 150.2 lb

## 2024-10-06 DIAGNOSIS — I6930 Unspecified sequelae of cerebral infarction: Secondary | ICD-10-CM

## 2024-10-06 DIAGNOSIS — I480 Paroxysmal atrial fibrillation: Secondary | ICD-10-CM | POA: Diagnosis not present

## 2024-10-06 DIAGNOSIS — I10 Essential (primary) hypertension: Secondary | ICD-10-CM

## 2024-10-06 DIAGNOSIS — E118 Type 2 diabetes mellitus with unspecified complications: Secondary | ICD-10-CM

## 2024-10-06 MED ORDER — ASPIRIN 81 MG PO TBEC
81.0000 mg | DELAYED_RELEASE_TABLET | Freq: Every day | ORAL | Status: AC
Start: 2024-10-06 — End: ?

## 2024-10-06 NOTE — Patient Instructions (Signed)
 Medication Instructions:  Stop Eliquis  Start Aspirin  81 mg *If you need a refill on your cardiac medications before your next appointment, please call your pharmacy*  Lab Work: NONE If you have labs (blood work) drawn today and your tests are completely normal, you will receive your results only by: MyChart Message (if you have MyChart) OR A paper copy in the mail If you have any lab test that is abnormal or we need to change your treatment, we will call you to review the results.  Testing/Procedures: NONE  Follow-Up: At [facility], you and your health needs are our priority.  As part of our continuing mission to provide you with exceptional heart care, our providers are all part of one team.  This team includes your primary Cardiologist (physician) and Advanced Practice Providers or APPs (Physician Assistants and Nurse Practitioners) who all work together to provide you with the care you need, when you need it.  Your next appointment:   1 year(s)  Provider:   Lynwood Schilling, MD    We recommend signing up for the patient portal called MyChart.  Sign up information is provided on this After Visit Summary.  MyChart is used to connect with patients for Virtual Visits (Telemedicine).  Patients are able to view lab/test results, encounter notes, upcoming appointments, etc.  Non-urgent messages can be sent to your provider as well.   To learn more about what you can do with MyChart, go to [email or web address].

## 2024-10-13 NOTE — Progress Notes (Signed)
 Electrophysiology Office Note:   Date:  10/15/2024  ID:  Ronald Kelley, DOB 03/22/1941, MRN [id number]  Primary Cardiologist: Lynwood Schilling, MD Electrophysiologist: Fonda Kitty, MD      History of Present Illness:   Ronald Kelley is a 83 y.o. male with h/o lone atrial fibrillation, stroke who is being seen today for loop recorder implant at the request of Dr. Schilling.  Per Dr. Denver note: He had a subarachnoid hemorrhage in Nov 2024 after falling out of bed. He had possible embolic stroke in Dec 2024. He was only taking his Eliquis  once daily at that point. He wants to come off Eliquis . Four week monitor demonstrated no atrial fib.   Discussed the use of AI scribe software for clinical note transcription with the patient, who gave verbal consent to proceed.  History of Present Illness Ronald Kelley is an 83 year old male with a history of atrial fibrillation who presents for evaluation and consideration of a loop recorder.  He has a history of atrial fibrillation, with an episode documented on an EKG in August 2010. He typically has low blood pressure and questions the diagnosis of atrial fibrillation. He recalls a possible episode after consuming regular coffee but did not seek medical evaluation at that time. He does not have any current episodes of atrial fibrillation. He has worn a heart monitor previously, which did not show any episodes.  Approximately one and a half to two years ago, he experienced a stroke, which his previous doctor, Dr. Rosan, attributed to atrial fibrillation according to the patient's recollection. Prior to the stroke, he had a fall from bed, hitting his head on a hardwood floor, resulting in significant bruising on the left side of his face, which he describes as severe.  He expresses disagreement with the need for anticoagulation therapy with Eliquis , as he does not believe he has atrial fibrillation. He is concerned about the risks associated with  blood thinners, especially in the context of falls.   Review of systems complete and found to be negative unless listed in HPI.   EP Information / Studies Reviewed:    EKG is ordered today. Personal review as below.  EKG Interpretation Date/Time:  Friday October 14 2024 08:05:49 EST Ventricular Rate:  50 PR Interval:  142 QRS Duration:  88 QT Interval:  418 QTC Calculation: 381 R Axis:   17  Text Interpretation: Sinus bradycardia Minimal voltage criteria for LVH, may be normal variant ( R in aVL ) T wave abnormality, consider inferior ischemia When compared with ECG of 11-Dec-2023 09:02, No significant change was found Confirmed by Kitty Fonda [phone]) on 10/14/2024 8:11:51 AM   ECG 07/22/2009: AF   Coronary CTA 10/04/21:  IMPRESSION: 1. 3 vessel coronary calcium  score 98.7 which is only 22 nd percentile for age/sex   2.  Normal aortic root 3.4 cm   3.  CAD RADS 2 non obstructive CAD see description above  Echo 10/04/21:   1. Left ventricular ejection fraction, by estimation, is 60 to 65%. The  left ventricle has normal function. The left ventricle has no regional  wall motion abnormalities. Left ventricular diastolic parameters are  consistent with Grade I diastolic  dysfunction (impaired relaxation). Elevated left ventricular end-diastolic  pressure. The average left ventricular global longitudinal strain is -16.3  %.   2. Right ventricular systolic function is normal. The right ventricular  size is normal. There is normal pulmonary artery systolic pressure.   3. Left atrial size was  moderately dilated.   4. The mitral valve is normal in structure. No evidence of mitral valve  regurgitation. No evidence of mitral stenosis.   5. The aortic valve is tricuspid. Aortic valve regurgitation is mild. No  aortic stenosis is present. Aortic regurgitation PHT measures 793 msec.   6. The inferior vena cava is normal in size with greater than 50%  respiratory variability,  suggesting right atrial pressure of 3 mmHg.        Physical Exam:   VS:  BP (!) 142/80   Pulse (!) 55   Ht 5' 5 (1.651 m)   Wt 151 lb 6.4 oz (68.7 kg)   SpO2 96%   BMI 25.19 kg/m    Wt Readings from Last 3 Encounters:  10/14/24 151 lb 6.4 oz (68.7 kg)  10/06/24 150 lb 3.2 oz (68.1 kg)  09/28/24 150 lb (68 kg)     General: Well developed, in no acute distress.  Neck: No JVD.  Cardiac: Bradycardic, regular rhythm.  Resp: Normal work of breathing.  Ext: No edema.  Neuro: No gross focal deficits.  Psych: Normal affect.    ASSESSMENT AND PLAN:    #Lone atrial fibrillation:  #Hypercoagulable state due to atrial fibrillation:  Patient is fearful of long-term oral anticoagulation due to prior falls and head injury.  Discussed role of loop recorder monitoring to confirm presence of atrial fibrillation, as requested by his primary cardiologist, Dr. Lavona.  Discussed risks and benefits of loop recorder implant.  Patient voiced understanding and would like to proceed today.  If atrial fibrillation is confirmed, then patient would be an appropriate candidate for either catheter ablation or watchman implantation.  #H/o CVA: - Discussed role of loop recorder implant and monitoring for atrial fibrillation as etiology for possible strokes.  Patient voiced understanding.  Discussed risk and benefits of loop recorder implant.  Patient voiced understanding and elected to proceed today.  #Hypertension -Above goal today.  Recommend checking blood pressures 1-2 times per week at home and recording the values.  Recommend bringing these recordings to the primary care physician.   SURGEON:  Fonda Kitty, MD     PREPROCEDURE DIAGNOSIS:  Atrial fibrillation and stroke    POSTPROCEDURE DIAGNOSIS: Atrial fibrillation and stroke     PROCEDURES:   1. Implantable loop recorder implantation    INTRODUCTION:  [name] presents with a history of atrial fibrillation The costs of loop recorder  monitoring have been discussed with the patient.    DESCRIPTION OF PROCEDURE:  Informed written consent was obtained.   Time Out Completed with RN    The patient required no sedation for the procedure today.  Mapping over the patient's chest was performed to identify the area where electrograms were most prominent for ILR recording.  This area was found to be the left parasternal region over the 4th intercostal space. The patients left chest was therefore prepped and draped in the usual sterile fashion. The skin overlying the left parasternal region was infiltrated with lidocaine  for local analgesia.  A 0.5-cm incision was made over the left parasternal region over the 3rd intercostal space.  A subcutaneous ILR pocket was fashioned using a combination of sharp and blunt dissection.  A [employer] LUX-Dx II+ implantable loop recorder was then placed into the pocket  R waves were very prominent and measuring >0.43mV.  Steri- Strips and a sterile dressing were then applied.  There were no early apparent complications.     CONCLUSIONS:   1.  Successful implantation of a implantable loop recorder for a history of atrial fibrillation.  2. No early apparent complications.   Follow up with Dr. Kennyth in 6 months  Signed, Fonda Kennyth, MD

## 2024-10-14 ENCOUNTER — Ambulatory Visit: Attending: Cardiology | Admitting: Cardiology

## 2024-10-14 ENCOUNTER — Encounter: Payer: Self-pay | Admitting: Cardiology

## 2024-10-14 VITALS — BP 142/80 | HR 55 | Ht 65.0 in | Wt 151.4 lb

## 2024-10-14 DIAGNOSIS — I4891 Unspecified atrial fibrillation: Secondary | ICD-10-CM | POA: Diagnosis not present

## 2024-10-14 DIAGNOSIS — D6869 Other thrombophilia: Secondary | ICD-10-CM

## 2024-10-14 DIAGNOSIS — I10 Essential (primary) hypertension: Secondary | ICD-10-CM

## 2024-10-14 DIAGNOSIS — Z8673 Personal history of transient ischemic attack (TIA), and cerebral infarction without residual deficits: Secondary | ICD-10-CM | POA: Diagnosis not present

## 2024-10-14 NOTE — Patient Instructions (Addendum)
 Medication Instructions:  Your physician recommends that you continue on your current medications as directed. Please refer to the Current Medication list given to you today.  Labwork: None ordered.  Testing/Procedures: None ordered.  Follow-Up:  As needed with Dr. Jimmey Ralph  Implantable Loop Recorder Placement, Care After This sheet gives you information about how to care for yourself after your procedure. Your health care provider may also give you more specific instructions. If you have problems or questions, contact your health care provider. What can I expect after the procedure? After the procedure, it is common to have: Soreness or discomfort near the incision. Some swelling or bruising near the incision.  Follow these instructions at home: Incision care  Monitor your cardiac device site for redness, swelling, and drainage. Call the device clinic at [phone] if you experience these symptoms or fever/chills.  Keep the large square bandage on your site for 24 hours and then you may remove it yourself. Keep the steri-strips underneath in place.   You may shower after 72 hours / 3 days from your procedure with the steri-strips in place. They will usually fall off on their own, or may be removed after 10 days. Pat dry.   Avoid lotions, ointments, or perfumes over your incision until it is well-healed.  Please do not submerge in water until your site is completely healed.   Your device is MRI compatible.   Remote monitoring is used to monitor your cardiac device from home. This monitoring is scheduled every month by our office. It allows [place] to keep an eye on the function of your device to ensure it is working properly.  If your wound site starts to bleed apply pressure.    If you have any questions/concerns please call the device clinic at [phone].  Activity  Return to your normal activities.  General instructions Follow instructions from your health care provider  about how to manage your implantable loop recorder and transmit the information. Learn how to activate a recording if this is necessary for your type of device. You may go through a metal detection gate, and you may let someone hold a metal detector over your chest. Show your ID card if needed. Do not have an MRI unless you check with your health care provider first. Take over-the-counter and prescription medicines only as told by your health care provider. Keep all follow-up visits as told by your health care provider. This is important. Contact a health care provider if: You have redness, swelling, or pain around your incision. You have a fever. You have pain that is not relieved by your pain medicine. You have triggered your device because of fainting (syncope) or because of a heartbeat that feels like it is racing, slow, fluttering, or skipping (palpitations). Get help right away if you have: Chest pain. Difficulty breathing. Summary After the procedure, it is common to have soreness or discomfort near the incision. Change your dressing as told by your health care provider. Follow instructions from your health care provider about how to manage your implantable loop recorder and transmit the information. Keep all follow-up visits as told by your health care provider. This is important.  This information is not intended to replace advice given to you by your health care provider. Make sure you discuss any questions you have with your health care provider. Document Released: 10/29/2015 Document Revised: 01/02/2018 Document Reviewed: 01/02/2018 Elsevier Patient Education  2020 [employer].

## 2024-11-14 ENCOUNTER — Ambulatory Visit: Attending: Family Medicine

## 2024-11-14 DIAGNOSIS — I480 Paroxysmal atrial fibrillation: Secondary | ICD-10-CM | POA: Diagnosis not present

## 2024-11-16 LAB — CUP PACEART REMOTE DEVICE CHECK
Date Time Interrogation Session: 20251215032200
Pulse Gen Serial Number: 165927

## 2024-11-18 NOTE — Progress Notes (Signed)
 Remote Loop Recorder Transmission

## 2024-12-07 NOTE — Progress Notes (Signed)
 NICKOLAOS BRALLIER                                          MRN: [id number]   12/07/2024   The [facility] Quality Team Specialist reviewed this patient medical record for the purposes of chart review for care gap closure. The following were reviewed: chart review for care gap closure-controlling blood pressure.    [facility] Quality Team

## 2024-12-12 ENCOUNTER — Other Ambulatory Visit: Payer: Self-pay | Admitting: Family Medicine

## 2024-12-12 DIAGNOSIS — R7989 Other specified abnormal findings of blood chemistry: Secondary | ICD-10-CM

## 2024-12-15 ENCOUNTER — Ambulatory Visit: Attending: Family Medicine

## 2024-12-15 DIAGNOSIS — I480 Paroxysmal atrial fibrillation: Secondary | ICD-10-CM | POA: Diagnosis not present

## 2024-12-16 ENCOUNTER — Telehealth: Payer: Self-pay

## 2024-12-16 LAB — CUP PACEART REMOTE DEVICE CHECK
Date Time Interrogation Session: 20260116052000
Pulse Gen Serial Number: 165927

## 2024-12-16 NOTE — Telephone Encounter (Signed)
Attempted to contact patient. No answer, left message to call back

## 2024-12-16 NOTE — Telephone Encounter (Signed)
 Alert received from [employer] for AF in progress from 1/15 @ 23:57, irregular R-R, no obvious P waves, burden 5.3%  Hx of PAF, no OAC, ASA only per EPIC.  Per EPIC pt is an ablation, watchman candidate.    Routing to Dr. Kennyth to review considering no OAC on MAR.

## 2024-12-19 MED ORDER — APIXABAN 5 MG PO TABS: 5.0000 mg | ORAL_TABLET | Freq: Two times a day (BID) | ORAL | 3 refills | Status: AC

## 2024-12-19 NOTE — Telephone Encounter (Signed)
 Spoke w/ patient regarding device alert for AF. Informed patient of need to start Eliquis  5mg  BID d/t AF. Patient verbalized understanding. Medication ordered and will be sent to pharmacy of patient request.   Patient states he has noticed 1 or 2 times of feeling an indigestion type feeling recently, but no other symptoms noted.   Patient scheduled to see Dr. Kennyth on 02/01/2025 @ 2:00pm.   Informed patient need for Eliquis  in order to prevent stroke from occurring. Also informed medication does pose risk of bleeding. Verbalized understanding.   Informed to call device clinic for any further questions or concerns.

## 2024-12-19 NOTE — Telephone Encounter (Signed)
 Unable to speak w/ patient or patient wife regarding recent device alert and the need to start Eliquis  5mg  BID per Dr. Kennyth. Voicemail left on both numbers listed requesting a call back to the device clinic.   Will continue to monitor and update accordingly.

## 2024-12-19 NOTE — Telephone Encounter (Signed)
 Pt returning a nurse's call. Please advise

## 2024-12-22 NOTE — Progress Notes (Signed)
 Remote Loop Recorder Transmission

## 2025-01-15 ENCOUNTER — Ambulatory Visit

## 2025-02-01 ENCOUNTER — Ambulatory Visit: Admitting: Cardiology

## 2025-02-15 ENCOUNTER — Ambulatory Visit

## 2025-03-23 ENCOUNTER — Other Ambulatory Visit

## 2025-03-30 ENCOUNTER — Encounter: Admitting: Family Medicine

## 2025-06-08 ENCOUNTER — Ambulatory Visit
